# Patient Record
Sex: Female | Born: 1980 | Race: Black or African American | Hispanic: No | Marital: Single | Smoking: Current every day smoker
Health system: Southern US, Community
[De-identification: ages and names within clinical notes are randomized; demographics above are authoritative.]

## PROBLEM LIST (undated history)

## (undated) ENCOUNTER — Inpatient Hospital Stay (HOSPITAL_COMMUNITY): Payer: Self-pay

## (undated) DIAGNOSIS — E785 Hyperlipidemia, unspecified: Secondary | ICD-10-CM

## (undated) DIAGNOSIS — R7303 Prediabetes: Secondary | ICD-10-CM

## (undated) DIAGNOSIS — I10 Essential (primary) hypertension: Secondary | ICD-10-CM

## (undated) DIAGNOSIS — E119 Type 2 diabetes mellitus without complications: Secondary | ICD-10-CM

## (undated) DIAGNOSIS — L309 Dermatitis, unspecified: Secondary | ICD-10-CM

## (undated) DIAGNOSIS — F419 Anxiety disorder, unspecified: Secondary | ICD-10-CM

## (undated) DIAGNOSIS — R51 Headache: Secondary | ICD-10-CM

## (undated) DIAGNOSIS — F3160 Bipolar disorder, current episode mixed, unspecified: Secondary | ICD-10-CM

## (undated) DIAGNOSIS — N83201 Unspecified ovarian cyst, right side: Secondary | ICD-10-CM

## (undated) DIAGNOSIS — F209 Schizophrenia, unspecified: Secondary | ICD-10-CM

## (undated) DIAGNOSIS — O24419 Gestational diabetes mellitus in pregnancy, unspecified control: Secondary | ICD-10-CM

## (undated) DIAGNOSIS — N83202 Unspecified ovarian cyst, left side: Secondary | ICD-10-CM

## (undated) HISTORY — DX: Hyperlipidemia, unspecified: E78.5

## (undated) HISTORY — PX: NO PAST SURGERIES: SHX2092

## (undated) HISTORY — DX: Type 2 diabetes mellitus without complications: E11.9

## (undated) HISTORY — DX: Dermatitis, unspecified: L30.9

## (undated) HISTORY — DX: Essential (primary) hypertension: I10

## (undated) HISTORY — DX: Prediabetes: R73.03

---
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
## 1999-02-21 ENCOUNTER — Emergency Department (HOSPITAL_COMMUNITY): Admission: EM | Admit: 1999-02-21 | Discharge: 1999-02-21 | Payer: Self-pay | Admitting: Emergency Medicine

## 1999-10-24 ENCOUNTER — Emergency Department (HOSPITAL_COMMUNITY): Admission: EM | Admit: 1999-10-24 | Discharge: 1999-10-24 | Payer: Self-pay

## 1999-10-24 ENCOUNTER — Encounter: Payer: Self-pay | Admitting: Emergency Medicine

## 1999-11-05 ENCOUNTER — Ambulatory Visit (HOSPITAL_COMMUNITY): Admission: RE | Admit: 1999-11-05 | Discharge: 1999-11-05 | Payer: Self-pay

## 2000-03-10 ENCOUNTER — Emergency Department (HOSPITAL_COMMUNITY): Admission: EM | Admit: 2000-03-10 | Discharge: 2000-03-10 | Payer: Self-pay | Admitting: Emergency Medicine

## 2000-06-19 ENCOUNTER — Emergency Department (HOSPITAL_COMMUNITY): Admission: EM | Admit: 2000-06-19 | Discharge: 2000-06-19 | Payer: Self-pay | Admitting: Emergency Medicine

## 2000-06-19 ENCOUNTER — Encounter: Payer: Self-pay | Admitting: Emergency Medicine

## 2001-07-18 ENCOUNTER — Emergency Department (HOSPITAL_COMMUNITY): Admission: EM | Admit: 2001-07-18 | Discharge: 2001-07-18 | Payer: Self-pay | Admitting: Emergency Medicine

## 2001-09-29 ENCOUNTER — Emergency Department (HOSPITAL_COMMUNITY): Admission: EM | Admit: 2001-09-29 | Discharge: 2001-09-29 | Payer: Self-pay | Admitting: Emergency Medicine

## 2002-05-09 ENCOUNTER — Emergency Department (HOSPITAL_COMMUNITY): Admission: EM | Admit: 2002-05-09 | Discharge: 2002-05-10 | Payer: Self-pay | Admitting: Emergency Medicine

## 2002-05-10 ENCOUNTER — Encounter: Payer: Self-pay | Admitting: Emergency Medicine

## 2003-06-30 ENCOUNTER — Inpatient Hospital Stay (HOSPITAL_COMMUNITY): Admission: AD | Admit: 2003-06-30 | Discharge: 2003-06-30 | Payer: Self-pay | Admitting: *Deleted

## 2003-07-15 ENCOUNTER — Ambulatory Visit (HOSPITAL_COMMUNITY): Admission: RE | Admit: 2003-07-15 | Discharge: 2003-07-15 | Payer: Self-pay | Admitting: *Deleted

## 2003-08-22 ENCOUNTER — Inpatient Hospital Stay (HOSPITAL_COMMUNITY): Admission: AD | Admit: 2003-08-22 | Discharge: 2003-08-22 | Payer: Self-pay | Admitting: Obstetrics and Gynecology

## 2003-09-27 ENCOUNTER — Ambulatory Visit (HOSPITAL_COMMUNITY): Admission: RE | Admit: 2003-09-27 | Discharge: 2003-09-27 | Payer: Self-pay | Admitting: Obstetrics and Gynecology

## 2003-11-23 ENCOUNTER — Ambulatory Visit: Payer: Self-pay | Admitting: *Deleted

## 2003-12-07 ENCOUNTER — Ambulatory Visit: Payer: Self-pay | Admitting: Family Medicine

## 2003-12-21 ENCOUNTER — Ambulatory Visit (HOSPITAL_COMMUNITY): Admission: RE | Admit: 2003-12-21 | Discharge: 2003-12-21 | Payer: Self-pay | Admitting: Obstetrics and Gynecology

## 2003-12-21 ENCOUNTER — Ambulatory Visit: Payer: Self-pay | Admitting: *Deleted

## 2004-01-04 ENCOUNTER — Ambulatory Visit: Payer: Self-pay | Admitting: *Deleted

## 2004-01-11 ENCOUNTER — Ambulatory Visit: Payer: Self-pay | Admitting: Obstetrics & Gynecology

## 2004-01-25 ENCOUNTER — Ambulatory Visit: Payer: Self-pay | Admitting: *Deleted

## 2004-01-27 ENCOUNTER — Ambulatory Visit: Payer: Self-pay | Admitting: *Deleted

## 2004-02-01 ENCOUNTER — Inpatient Hospital Stay (HOSPITAL_COMMUNITY): Admission: AD | Admit: 2004-02-01 | Discharge: 2004-02-08 | Payer: Self-pay | Admitting: *Deleted

## 2004-02-01 ENCOUNTER — Ambulatory Visit: Payer: Self-pay | Admitting: Obstetrics & Gynecology

## 2004-02-01 ENCOUNTER — Ambulatory Visit: Payer: Self-pay | Admitting: Obstetrics and Gynecology

## 2004-12-30 ENCOUNTER — Emergency Department (HOSPITAL_COMMUNITY): Admission: EM | Admit: 2004-12-30 | Discharge: 2004-12-30 | Payer: Self-pay | Admitting: Emergency Medicine

## 2005-05-03 ENCOUNTER — Emergency Department (HOSPITAL_COMMUNITY): Admission: EM | Admit: 2005-05-03 | Discharge: 2005-05-03 | Payer: Self-pay | Admitting: Emergency Medicine

## 2005-08-21 ENCOUNTER — Emergency Department (HOSPITAL_COMMUNITY): Admission: EM | Admit: 2005-08-21 | Discharge: 2005-08-21 | Payer: Self-pay | Admitting: Emergency Medicine

## 2006-01-23 ENCOUNTER — Emergency Department (HOSPITAL_COMMUNITY): Admission: EM | Admit: 2006-01-23 | Discharge: 2006-01-23 | Payer: Self-pay | Admitting: Emergency Medicine

## 2006-02-12 ENCOUNTER — Emergency Department (HOSPITAL_COMMUNITY): Admission: EM | Admit: 2006-02-12 | Discharge: 2006-02-12 | Payer: Self-pay | Admitting: Emergency Medicine

## 2006-11-12 ENCOUNTER — Ambulatory Visit: Payer: Self-pay | Admitting: Obstetrics & Gynecology

## 2006-11-12 ENCOUNTER — Encounter (INDEPENDENT_AMBULATORY_CARE_PROVIDER_SITE_OTHER): Payer: Self-pay | Admitting: *Deleted

## 2007-10-06 ENCOUNTER — Emergency Department (HOSPITAL_COMMUNITY): Admission: EM | Admit: 2007-10-06 | Discharge: 2007-10-06 | Payer: Self-pay | Admitting: Emergency Medicine

## 2008-04-16 ENCOUNTER — Emergency Department (HOSPITAL_COMMUNITY): Admission: EM | Admit: 2008-04-16 | Discharge: 2008-04-16 | Payer: Self-pay | Admitting: Family Medicine

## 2008-05-06 ENCOUNTER — Emergency Department (HOSPITAL_COMMUNITY): Admission: EM | Admit: 2008-05-06 | Discharge: 2008-05-06 | Payer: Self-pay | Admitting: Emergency Medicine

## 2009-09-20 ENCOUNTER — Emergency Department (HOSPITAL_COMMUNITY): Admission: EM | Admit: 2009-09-20 | Discharge: 2009-09-20 | Payer: Self-pay | Admitting: Emergency Medicine

## 2009-12-14 ENCOUNTER — Emergency Department (HOSPITAL_COMMUNITY): Admission: EM | Admit: 2009-12-14 | Discharge: 2009-12-14 | Payer: Self-pay | Admitting: Emergency Medicine

## 2010-04-03 LAB — URINALYSIS, ROUTINE W REFLEX MICROSCOPIC
Bilirubin Urine: NEGATIVE
Glucose, UA: NEGATIVE mg/dL
Urobilinogen, UA: 1 mg/dL (ref 0.0–1.0)
pH: 6.5 (ref 5.0–8.0)

## 2010-04-03 LAB — URINE CULTURE: Culture  Setup Time: 201111241804

## 2010-04-03 LAB — URINE MICROSCOPIC-ADD ON

## 2010-04-03 LAB — POCT I-STAT, CHEM 8
Calcium, Ion: 1.1 mmol/L — ABNORMAL LOW (ref 1.12–1.32)
Glucose, Bld: 92 mg/dL (ref 70–99)
HCT: 39 % (ref 36.0–46.0)
Hemoglobin: 13.3 g/dL (ref 12.0–15.0)
TCO2: 23 mmol/L (ref 0–100)

## 2010-04-03 LAB — CBC
MCV: 91.3 fL (ref 78.0–100.0)
Platelets: 276 10*3/uL (ref 150–400)

## 2010-04-03 LAB — WET PREP, GENITAL
Clue Cells Wet Prep HPF POC: NONE SEEN
Yeast Wet Prep HPF POC: NONE SEEN

## 2010-04-03 LAB — GC/CHLAMYDIA PROBE AMP, GENITAL
Chlamydia, DNA Probe: NEGATIVE
GC Probe Amp, Genital: NEGATIVE

## 2010-04-03 LAB — DIFFERENTIAL
Lymphs Abs: 3.7 10*3/uL (ref 0.7–4.0)
Neutrophils Relative %: 47 % (ref 43–77)

## 2010-04-06 LAB — COMPREHENSIVE METABOLIC PANEL
ALT: 16 U/L (ref 0–35)
Chloride: 110 mEq/L (ref 96–112)
Creatinine, Ser: 0.92 mg/dL (ref 0.4–1.2)
Glucose, Bld: 91 mg/dL (ref 70–99)
Total Protein: 6.7 g/dL (ref 6.0–8.3)

## 2010-04-06 LAB — CBC
HCT: 35.7 % — ABNORMAL LOW (ref 36.0–46.0)
MCH: 30.8 pg (ref 26.0–34.0)
RBC: 3.89 MIL/uL (ref 3.87–5.11)
RDW: 13.7 % (ref 11.5–15.5)

## 2010-04-06 LAB — URINE MICROSCOPIC-ADD ON

## 2010-04-06 LAB — DIFFERENTIAL
Basophils Relative: 1 % (ref 0–1)
Eosinophils Absolute: 0.1 10*3/uL (ref 0.0–0.7)
Eosinophils Relative: 1 % (ref 0–5)
Monocytes Absolute: 1.1 10*3/uL — ABNORMAL HIGH (ref 0.1–1.0)
Monocytes Relative: 14 % — ABNORMAL HIGH (ref 3–12)
Neutrophils Relative %: 51 % (ref 43–77)

## 2010-04-06 LAB — URINE CULTURE

## 2010-04-06 LAB — URINALYSIS, ROUTINE W REFLEX MICROSCOPIC
Nitrite: POSITIVE — AB
Protein, ur: NEGATIVE mg/dL

## 2010-04-06 LAB — PREGNANCY, URINE: Preg Test, Ur: NEGATIVE

## 2010-04-16 ENCOUNTER — Emergency Department (HOSPITAL_COMMUNITY)
Admission: EM | Admit: 2010-04-16 | Discharge: 2010-04-16 | Discharge status: Against Medical Advice | Payer: Self-pay | Attending: Emergency Medicine | Admitting: Emergency Medicine

## 2010-04-16 DIAGNOSIS — M549 Dorsalgia, unspecified: Secondary | ICD-10-CM | POA: Insufficient documentation

## 2010-04-16 DIAGNOSIS — R05 Cough: Secondary | ICD-10-CM | POA: Insufficient documentation

## 2010-04-16 DIAGNOSIS — R059 Cough, unspecified: Secondary | ICD-10-CM | POA: Insufficient documentation

## 2010-04-16 DIAGNOSIS — R0602 Shortness of breath: Secondary | ICD-10-CM | POA: Insufficient documentation

## 2010-06-08 NOTE — Discharge Summary (Signed)
Rebecca Spence, Rebecca Spence                 ACCOUNT NO.:  [id number]   MEDICAL RECORD NO.:  [id number]          PATIENT TYPE:  WOC   LOCATION:  WOC                          FACILITY:  [facility]   PHYSICIAN:  Adrian Blackwater, MDDATE OF BIRTH:  12-12-1980   DATE OF ADMISSION:  02/01/2004  DATE OF DISCHARGE:  02/08/2004                                 DISCHARGE SUMMARY   ADMISSION DIAGNOSES:  1.  Preeclampsia.  2.  Induction of labor.   DISCHARGE DIAGNOSIS:  Postpartum day #3 pregnancy induced hypertension.   BRIEF HISTORY:  This is a 30 year old G1 P1-0-0-1 that presented at 36-1/7  weeks with diagnosis of preeclampsia, severe by labs, vital signs and  symptoms.  She was admitted for induction of labor on February 01, 2004.  Cervidil was placed, alternating with slow progress into labor during the  day on February 02, 2004 and February 03, 2004.  The patient was stable and  the fetal monitor was reassuring during those days and the patient did not  have any contractions but uterine irritability.  Blood pressure during those  days was controlled.  The patient was receiving magnesium therapy up to 80  mg per hour.  On February 03, 2004 at 4 p.m. the patient was placed on  Pitocin low dose per protocol to augment labor and penicillin was given for  GBS prophylaxis.  Epidural anesthesia was placed on February 04, 2004.  The  patient delivered on February 06, 2004 at 5 p.m. by spontaneous vaginal  delivery a viable female with Apgars of 8 at one minute and 9 at five minutes  under epidural anesthesia.  The infant was handed to mom.  No placental  abnormalities spontaneous delivery.  There was no laceration and 100 cubic  centimeters estimated blood loss.  The patient and the infant were stable.  The patient was transferred to AICU to continue magnesium therapy for the  next 24 hours, then stable with PIH labs which showed creatinine normalizing  from 0.9 to 0.8 and the patient asymptomatic.  After 24  hours of magnesium  therapy, the patient remained asymptomatic with postpartum recovery with  blood pressure 130/80 to 150/90.  The patient was able to be discharged to  home with a diagnosis of preeclampsia not requiring any medications.  At  discharge, the patient will use only ibuprofen for pain control which is  none at this moment, Ortho-Novum 1/35 for birth control and will have a  follow-up appointment at [facility] in six weeks after delivery.  The  infant will now be going home with mom after circumcision procedure and will  be bottle fed.   LABORATORY DATA AT DISCHARGE:  CBC:  White blood cells 19.7, hemoglobin  11.4, hematocrit 36.1, platelets 140,000.  CMP:  Sodium 136, potassium 3.9,  chloride 103, CO2 23, BUN 3, creatinine 0.9, glucose 75, AST 28, ALT 13,  uric acid 6.8, LDH 291, magnesium 6.4.   FOLLOWUP:  The patient will have a follow-up appointment in six weeks, will  be very important to check on creatinine and uric acid.   DISCHARGE  MEDICATIONS:  Ibuprofen 400 mg q.6h. for pain p.r.n., Ortho-Novum  1/35 for birth control pills.      IM/MEDQ  D:  02/08/2004  T:  02/08/2004  Job:  [id number]   cc:   Javier Glazier. Okey Dupre, M.D.  Fax: [phone]

## 2010-06-28 ENCOUNTER — Emergency Department (HOSPITAL_COMMUNITY)
Admission: EM | Admit: 2010-06-28 | Discharge: 2010-06-29 | Disposition: A | Discharge status: Home / Self Care | Payer: Self-pay | Attending: Emergency Medicine | Admitting: Emergency Medicine

## 2010-06-28 DIAGNOSIS — L500 Allergic urticaria: Secondary | ICD-10-CM | POA: Insufficient documentation

## 2010-06-28 DIAGNOSIS — Z91018 Allergy to other foods: Secondary | ICD-10-CM | POA: Insufficient documentation

## 2010-06-28 DIAGNOSIS — R229 Localized swelling, mass and lump, unspecified: Secondary | ICD-10-CM | POA: Insufficient documentation

## 2010-06-28 DIAGNOSIS — T781XXA Other adverse food reactions, not elsewhere classified, initial encounter: Secondary | ICD-10-CM | POA: Insufficient documentation

## 2010-07-30 ENCOUNTER — Emergency Department (HOSPITAL_COMMUNITY)
Admission: EM | Admit: 2010-07-30 | Discharge: 2010-07-30 | Disposition: A | Discharge status: Home / Self Care | Payer: Self-pay | Attending: Emergency Medicine | Admitting: Emergency Medicine

## 2010-07-30 DIAGNOSIS — I10 Essential (primary) hypertension: Secondary | ICD-10-CM | POA: Insufficient documentation

## 2010-07-30 DIAGNOSIS — T7840XA Allergy, unspecified, initial encounter: Secondary | ICD-10-CM | POA: Insufficient documentation

## 2010-07-30 DIAGNOSIS — R220 Localized swelling, mass and lump, head: Secondary | ICD-10-CM | POA: Insufficient documentation

## 2010-07-30 DIAGNOSIS — R221 Localized swelling, mass and lump, neck: Secondary | ICD-10-CM | POA: Insufficient documentation

## 2010-07-30 DIAGNOSIS — F17200 Nicotine dependence, unspecified, uncomplicated: Secondary | ICD-10-CM | POA: Insufficient documentation

## 2010-07-30 DIAGNOSIS — L299 Pruritus, unspecified: Secondary | ICD-10-CM | POA: Insufficient documentation

## 2010-07-30 DIAGNOSIS — R21 Rash and other nonspecific skin eruption: Secondary | ICD-10-CM | POA: Insufficient documentation

## 2010-07-30 DIAGNOSIS — Z79899 Other long term (current) drug therapy: Secondary | ICD-10-CM | POA: Insufficient documentation

## 2010-10-11 LAB — US OB DETAIL + 14 WK

## 2010-10-22 LAB — URINALYSIS, ROUTINE W REFLEX MICROSCOPIC
Bilirubin Urine: NEGATIVE
Nitrite: NEGATIVE
Protein, ur: NEGATIVE
pH: 6

## 2010-10-22 LAB — CBC
HCT: 37.1
Hemoglobin: 12.4
MCHC: 33.5
MCV: 94.7
RBC: 3.92
WBC: 10.1

## 2010-10-22 LAB — COMPREHENSIVE METABOLIC PANEL
AST: 21
BUN: 10
CO2: 25
Calcium: 9.4
Chloride: 104
Creatinine, Ser: 0.78
GFR calc non Af Amer: >60
Glucose, Bld: 80
Total Bilirubin: 0.8

## 2010-10-22 LAB — LIPASE, BLOOD: Lipase: 21

## 2010-10-22 LAB — DIFFERENTIAL
Basophils Absolute: 0.1
Eosinophils Relative: 2
Lymphocytes Relative: 36
Neutro Abs: 5.3
Neutrophils Relative %: 52

## 2010-10-22 LAB — WET PREP, GENITAL: Yeast Wet Prep HPF POC: NONE SEEN

## 2010-11-15 ENCOUNTER — Encounter: Payer: Self-pay | Admitting: Obstetrics and Gynecology

## 2010-12-25 ENCOUNTER — Emergency Department (HOSPITAL_COMMUNITY)
Admission: EM | Admit: 2010-12-25 | Discharge: 2010-12-25 | Disposition: A | Discharge status: Home / Self Care | Payer: Medicaid Other | Attending: Emergency Medicine | Admitting: Emergency Medicine

## 2010-12-25 DIAGNOSIS — F17200 Nicotine dependence, unspecified, uncomplicated: Secondary | ICD-10-CM | POA: Insufficient documentation

## 2010-12-25 DIAGNOSIS — R5383 Other fatigue: Secondary | ICD-10-CM | POA: Insufficient documentation

## 2010-12-25 DIAGNOSIS — J3489 Other specified disorders of nose and nasal sinuses: Secondary | ICD-10-CM | POA: Insufficient documentation

## 2010-12-25 DIAGNOSIS — R000 Tachycardia, unspecified: Secondary | ICD-10-CM | POA: Insufficient documentation

## 2010-12-25 DIAGNOSIS — R51 Headache: Secondary | ICD-10-CM | POA: Insufficient documentation

## 2010-12-25 DIAGNOSIS — R05 Cough: Secondary | ICD-10-CM | POA: Insufficient documentation

## 2010-12-25 DIAGNOSIS — IMO0001 Reserved for inherently not codable concepts without codable children: Secondary | ICD-10-CM | POA: Insufficient documentation

## 2010-12-25 DIAGNOSIS — R5381 Other malaise: Secondary | ICD-10-CM | POA: Insufficient documentation

## 2010-12-25 DIAGNOSIS — R059 Cough, unspecified: Secondary | ICD-10-CM | POA: Insufficient documentation

## 2010-12-25 DIAGNOSIS — R070 Pain in throat: Secondary | ICD-10-CM | POA: Insufficient documentation

## 2010-12-25 DIAGNOSIS — J111 Influenza due to unidentified influenza virus with other respiratory manifestations: Secondary | ICD-10-CM

## 2010-12-25 MED ORDER — OSELTAMIVIR PHOSPHATE 75 MG PO CAPS: 75.0000 mg | ORAL_CAPSULE | Freq: Two times a day (BID) | ORAL | Status: AC

## 2010-12-25 MED ORDER — ACETAMINOPHEN 325 MG PO TABS
650.0000 mg | ORAL_TABLET | Freq: Once | ORAL | Status: AC
  Administered 2010-12-25: 650 mg via ORAL
  Filled 2010-12-25: qty 2

## 2010-12-25 NOTE — ED Notes (Signed)
Notified NP of measured bP

## 2010-12-25 NOTE — ED Notes (Signed)
Pt to ED for eval of body aches/sorethroat; pt reports that she has been unable to eat since Sunday d/t a sore throat; pt having severe headache, body aches, pt reports fevers at home but did not check;

## 2010-12-25 NOTE — ED Provider Notes (Signed)
Evaluation and management procedures were performed by the PA/NP under my supervision/collaboration.   Dione Booze, MD 12/25/10 [phone]

## 2010-12-25 NOTE — ED Provider Notes (Signed)
History     CSN: [id number] Arrival date & time: 12/25/2010  5:34 PM   First MD Initiated Contact with Patient 12/25/10 2031      Chief Complaint  Patient presents with  . Headache  . Sore Throat  . Generalized Body Aches    (Consider location/radiation/quality/duration/timing/severity/associated sxs/prior treatment) HPI Comments: [name] reports acute onset of flulike symptoms including headache, sore throat, cough, myalgias, and fever to 103 since Sunday.  She has been taking over-the-counter TheraFlu and Advil with some resolution of her fevers.  Reports little intake due to sore throat pain, not inability to swallow.  Reports fatigue with any exertion.  Did not get a flu shot this season.  Patient is a 30 y.o. female presenting with headaches and pharyngitis. The history is provided by the patient.  Headache  This is a new problem. The current episode started 2 days ago. The problem occurs constantly. The problem has not changed since onset.The headache is associated with nothing. The pain is at a severity of 5/10. The pain is moderate. Associated symptoms include a fever. Pertinent negatives include no shortness of breath, no nausea and no vomiting. She has tried NSAIDs for the symptoms.  Sore Throat Associated symptoms include congestion, coughing, fatigue, a fever, headaches, myalgias and a sore throat. Pertinent negatives include no chest pain, nausea, vomiting or weakness.    No past medical history on file.  No past surgical history on file.  No family history on file.  History  Substance Use Topics  . Smoking status: Current Everyday Smoker  . Smokeless tobacco: Not on file  . Alcohol Use: Yes     socially    OB History    Grav Para Term Preterm Abortions TAB SAB Ect Mult Living                  Review of Systems  Constitutional: Positive for fever and fatigue.  HENT: Positive for congestion, sore throat and sinus pressure.   Eyes: Negative.     Respiratory: Positive for cough. Negative for shortness of breath and wheezing.   Cardiovascular: Negative for chest pain.  Gastrointestinal: Negative for nausea and vomiting.  Genitourinary: Negative for dysuria and frequency.  Musculoskeletal: Positive for myalgias.  Skin: Negative.   Neurological: Positive for headaches. Negative for dizziness and weakness.  Hematological: Negative.   Psychiatric/Behavioral: Negative.     Allergies  Review of patient's allergies indicates no known allergies.  Home Medications   Current Outpatient Rx  Name Route Sig Dispense Refill  . IBUPROFEN PO Oral Take 1 tablet by mouth every 6 (six) hours as needed. For pain.       BP 148/107  Pulse 103  Temp(Src) 99.1 F (37.3 C) (Oral)  Resp 20  Ht 5\' 7"  (1.702 m)  Wt 200 lb (90.719 kg)  BMI 31.32 kg/m2  SpO2 100%  LMP 12/07/2010  Physical Exam  Constitutional: She is oriented to person, place, and time. She appears well-developed and well-nourished.  HENT:  Head: Normocephalic.  Eyes: EOM are normal.  Neck: Neck supple. No tracheal deviation present.  Cardiovascular: Tachycardia present.   Pulmonary/Chest: Effort normal. No respiratory distress. She has no wheezes.  Abdominal: Soft.  Musculoskeletal: Normal range of motion.  Neurological: She is oriented to person, place, and time.  Skin: Skin is warm and dry.  Psychiatric: She has a normal mood and affect.    ED Course  Procedures (including critical care time)  Labs Reviewed - No data to  display No results found.   No diagnosis found.    MDM  This is most likely flu.  We'll treat patient with Tamiflu instructed to take alternating doses of Tylenol, ibuprofen, increase fluids, rest, we'll provide work excuse.        Arman Filter, NP 12/25/10 2101

## 2010-12-25 NOTE — ED Notes (Signed)
Pt complains of real bad head, throat and body pain.

## 2011-01-22 NOTE — L&D Delivery Note (Signed)
Delivery Note At 1:28 PM a viable female was delivered via  (Presentation: ;  ).  APGAR: , ; weight .   Placenta status: , .  Cord:  with the following complications: .  Cord pH: not done  Anesthesia: Epidural  Episiotomy:  Lacerations:  Suture Repair: 2.0 Est. Blood Loss (mL):   Mom to postpartum.  Baby to nursery-stable.  Jordyne Poehlman A 10/21/2011, 1:39 PM

## 2011-03-10 ENCOUNTER — Encounter (HOSPITAL_COMMUNITY): Payer: Self-pay

## 2011-03-10 ENCOUNTER — Inpatient Hospital Stay (HOSPITAL_COMMUNITY)
Admission: AD | Admit: 2011-03-10 | Discharge: 2011-03-10 | Disposition: A | Discharge status: Home / Self Care | Payer: Medicaid Other | Source: Ambulatory Visit | Attending: Obstetrics & Gynecology | Admitting: Obstetrics & Gynecology

## 2011-03-10 ENCOUNTER — Inpatient Hospital Stay (HOSPITAL_COMMUNITY): Payer: Medicaid Other

## 2011-03-10 DIAGNOSIS — Z3490 Encounter for supervision of normal pregnancy, unspecified, unspecified trimester: Secondary | ICD-10-CM

## 2011-03-10 DIAGNOSIS — O99891 Other specified diseases and conditions complicating pregnancy: Secondary | ICD-10-CM | POA: Insufficient documentation

## 2011-03-10 DIAGNOSIS — Z1389 Encounter for screening for other disorder: Secondary | ICD-10-CM

## 2011-03-10 DIAGNOSIS — R109 Unspecified abdominal pain: Secondary | ICD-10-CM | POA: Insufficient documentation

## 2011-03-10 LAB — POCT PREGNANCY, URINE: Preg Test, Ur: POSITIVE — AB

## 2011-03-10 LAB — URINALYSIS, ROUTINE W REFLEX MICROSCOPIC
Glucose, UA: NEGATIVE mg/dL
Hgb urine dipstick: NEGATIVE
Ketones, ur: NEGATIVE mg/dL
pH: 7 (ref 5.0–8.0)

## 2011-03-10 LAB — URINE MICROSCOPIC-ADD ON

## 2011-03-10 LAB — HCG, QUANTITATIVE, PREGNANCY: hCG, Beta Chain, Quant, S: 8764 m[IU]/mL — ABNORMAL HIGH (ref <5)

## 2011-03-10 LAB — WET PREP, GENITAL: Yeast Wet Prep HPF POC: NONE SEEN

## 2011-03-10 NOTE — Progress Notes (Signed)
Onset of left lower abdominal pain since Friday night, nausea, positive home pregnancy test, LMP 01/29/11.

## 2011-03-10 NOTE — ED Provider Notes (Signed)
Chief Complaint:  Abdominal Cramping   FAIGA STONES is  31 y.o. G2P1.  Patient's last menstrual period was 01/29/2011.Marland Kitchen  Her pregnancy status is positive. [redacted]w[redacted]d by LMP She presents complaining of Abdominal Cramping  Reports left lower abdominal pain since Friday night, nausea, positive home pregnancy test, LMP 01/29/11.    Obstetrical/Gynecological History: OB History    Grav Para Term Preterm Abortions TAB SAB Ect Mult Living   2 1        1       Past Medical History: No past medical history on file.  Past Surgical History: No past surgical history on file.  Family History: No family history on file.  Social History: History  Substance Use Topics  . Smoking status: Current Everyday Smoker  . Smokeless tobacco: Not on file  . Alcohol Use: Yes     socially    Allergies: No Known Allergies  Prescriptions prior to admission  Medication Sig Dispense Refill  . ibuprofen (ADVIL,MOTRIN) 200 MG tablet Take 400 mg by mouth daily as needed. For pain      . Multiple Vitamin (MULITIVITAMIN WITH MINERALS) TABS Take 1 tablet by mouth daily.      Marland Kitchen OVER THE COUNTER MEDICATION Take 1 tablet by mouth daily. Pt states that she took an over thr counter diet pill, only once because she didn't like the way it made her feel        Review of Systems - Negative except what has been reviewed in the HPI  Physical Exam   Temperature 97.7 F (36.5 C), temperature source Oral, height 5\' 7"  (1.702 m), weight 234 lb 9.6 oz (106.414 kg), last menstrual period 01/29/2011.  General: General appearance - alert, well appearing, and in no distress, oriented to person, place, and time and overweight Mental status - alert, oriented to person, place, and time, normal mood, behavior, speech, dress, motor activity, and thought processes, affect appropriate to mood Abdomen - soft, nontender, nondistended, no masses or organomegaly obese Focused Gynecological Exam: VULVA: normal appearing vulva with no masses,  tenderness or lesions, VAGINA: normal appearing vagina with normal color and discharge, no lesions, CERVIX: normal appearing cervix without discharge or lesions, UTERUS: enlarged to 4-5 week's size, ADNEXA: Right adnexal enlargement, non tender  Labs: Recent Results (from the past 24 hour(s))  URINALYSIS, ROUTINE W REFLEX MICROSCOPIC   Collection Time   03/10/11 10:15 AM      Component Value Range   Color, Urine YELLOW  YELLOW    APPearance HAZY (*) CLEAR    Specific Gravity, Urine 1.020  1.005 - 1.030    pH 7.0  5.0 - 8.0    Glucose, UA NEGATIVE  NEGATIVE (mg/dL)   Hgb urine dipstick NEGATIVE  NEGATIVE    Bilirubin Urine NEGATIVE  NEGATIVE    Ketones, ur NEGATIVE  NEGATIVE (mg/dL)   Protein, ur NEGATIVE  NEGATIVE (mg/dL)   Urobilinogen, UA 0.2  0.0 - 1.0 (mg/dL)   Nitrite NEGATIVE  NEGATIVE    Leukocytes, UA MODERATE (*) NEGATIVE   URINE MICROSCOPIC-ADD ON   Collection Time   03/10/11 10:15 AM      Component Value Range   Squamous Epithelial / LPF MANY (*) RARE    WBC, UA 21-50  <3 (WBC/hpf)   RBC / HPF 0-2  <3 (RBC/hpf)   Bacteria, UA MANY (*) RARE   POCT PREGNANCY, URINE   Collection Time   03/10/11 10:25 AM      Component Value Range  Preg Test, Ur POSITIVE (*) NEGATIVE   ABO/RH   Collection Time   03/10/11 10:43 AM      Component Value Range   ABO/RH(D) A POS    HCG, QUANTITATIVE, PREGNANCY   Collection Time   03/10/11 10:43 AM      Component Value Range   hCG, Beta Chain, Quant, S 8764 (*) <5 (mIU/mL)  WET PREP, GENITAL   Collection Time   03/10/11 11:09 AM      Component Value Range   Yeast Wet Prep HPF POC NONE SEEN  NONE SEEN    Trich, Wet Prep NONE SEEN  NONE SEEN    Clue Cells Wet Prep HPF POC NONE SEEN  NONE SEEN    WBC, Wet Prep HPF POC MODERATE (*) NONE SEEN    Imaging Studies:  No results found.   Assessment: IUP [redacted]w[redacted]d   Plan: Discharge home  Early preg precautions reviewd MCD info, referral list, and verification letter given  Tamika Shropshire  E. 03/10/2011,12:15 PM

## 2011-03-10 NOTE — Discharge Instructions (Signed)
    ________________________________________ ° ° ° ° °To schedule your Maternity Eligibility Appointment, please call [phone].  When you arrive for your appointment you must bring the following items or information listed below.  Your appointment will be rescheduled if you do not have these items or are 15 minutes late. °If currently receiving Medicaid, you MUST bring: °1. Medicaid Card °2. Social Security Card °3. Picture ID °4. Proof of Pregnancy °5. Verification of current address if the address on Medicaid card is incorrect "postmarked mail" °If not receiving Medicaid, you MUST bring: °1. Social Security Card °2. Picture ID °3. Birth Certificate (if available) Passport or *Green Card °4. Proof of Pregnancy °5. Verification of current address "postmarked mail" for each income presented. °6. Verification of insurance coverage, if any °7. Check stubs from each employer for the previous month (if unable to present check stub  °for each week, we will accept check stub for the first and last week ill the same month.) If you can't locate check stubs, you must bring a letter from the employer(s) and it must have the following information on letterhead, typed, in English: °o name of company °o company telephone number °o how long been with the company, if less than one month °o how much person earns per hour °o how many hours per week work °o the gross pay the person earned for the previous month °If you are 32 years old or less, you do not have to bring proof of income unless you work or live with the father of the baby and at that time we will need proof of income from you and/or the father of the baby. °Green Card recipients are eligible for Medicaid for Pregnant Women (MPW) ° °Prenatal Care Providers °[facility]    [facility] ° Phone- [phone]     Phone: [phone] °         °[facility]                      [facility] ° @[place]     Phone: [phone] ° Phone: [phone] °        [facility] °[facility]     Phone: [phone] ° Phone: [phone]   °        [facility] °[facility] [place]                hone: [phone] ° Phone: [phone] °        [facility] °Dr. Bernard Marshall      Phone: [phone] ° Phone: [phone] °        [facility] °[facility]                Phone: [phone] ° Women’s Health  ° Phone:[phone]    [facility] ([place]) °         Phone: [phone] °[facility] °  Phone: [phone] °

## 2011-03-23 ENCOUNTER — Encounter (HOSPITAL_COMMUNITY): Payer: Self-pay | Admitting: Obstetrics and Gynecology

## 2011-03-23 ENCOUNTER — Inpatient Hospital Stay (HOSPITAL_COMMUNITY)
Admission: AD | Admit: 2011-03-23 | Discharge: 2011-03-23 | Disposition: A | Discharge status: Home / Self Care | Payer: Medicaid Other | Source: Ambulatory Visit | Attending: Obstetrics & Gynecology | Admitting: Obstetrics & Gynecology

## 2011-03-23 DIAGNOSIS — O26859 Spotting complicating pregnancy, unspecified trimester: Secondary | ICD-10-CM

## 2011-03-23 DIAGNOSIS — O209 Hemorrhage in early pregnancy, unspecified: Secondary | ICD-10-CM | POA: Insufficient documentation

## 2011-03-23 NOTE — ED Provider Notes (Signed)
History     No chief complaint on file.  HPI 31 y.o. G2P0101 at [redacted]w[redacted]d with one episode of vaginal bleeding with wiping following intercourse this morning. No pain. IUP confirmed at MAU visit on 2/17. Has appt with Dr. Gaynell Face for prenatal care on Monday.     No past medical history on file.  History reviewed. No pertinent past surgical history.  History reviewed. No pertinent family history.  History  Substance Use Topics  . Smoking status: Former Smoker    Types: Cigarettes  . Smokeless tobacco: Not on file  . Alcohol Use: No     socially-not with pregnancy    Allergies:  Allergies  Allergen Reactions  . Orange Anaphylaxis and Hives    "My throat starts closing up, my tongue, lips started swelling & burning    Prescriptions prior to admission  Medication Sig Dispense Refill  . Multiple Vitamin (MULITIVITAMIN WITH MINERALS) TABS Take 1 tablet by mouth daily.        Review of Systems  Constitutional: Negative.   Respiratory: Negative.   Cardiovascular: Negative.   Gastrointestinal: Negative for nausea, vomiting, abdominal pain, diarrhea and constipation.  Genitourinary: Negative for dysuria, urgency, frequency, hematuria and flank pain.       Positive for vaginal bleeding  Musculoskeletal: Negative.   Neurological: Negative.   Psychiatric/Behavioral: Negative.    Physical Exam   Blood pressure 131/75, pulse 97, temperature 98.8 F (37.1 C), temperature source Oral, resp. rate 20, height 5\' 7"  (1.702 m), weight 239 lb (108.41 kg), last menstrual period 01/29/2011.  Physical Exam  Nursing note and vitals reviewed. Constitutional: She is oriented to person, place, and time. She appears well-developed and well-nourished. No distress.  HENT:  Head: Normocephalic and atraumatic.  Cardiovascular: Normal rate and regular rhythm.   Respiratory: Effort normal. No respiratory distress.  GI: Soft. She exhibits no distension and no mass. There is no tenderness. There is  no rebound and no guarding.  Genitourinary: There is no rash or lesion on the right labia. There is no rash or lesion on the left labia. No erythema, tenderness or bleeding around the vagina. No vaginal discharge found.       Cervix closed, no evidence of bleeding   Neurological: She is alert and oriented to person, place, and time.  Skin: Skin is warm and dry.  Psychiatric: She has a normal mood and affect.    MAU Course  Procedures  Bedside u/s for viability only - + FHR, 150s  Assessment and Plan  30 y.o. G2P0101 at [redacted]w[redacted]d Postcoital bleeding - no active bleeding Precautions rev'd F/U for prenatal care as scheduled  Lanisha Stepanian 03/23/2011, 5:12 AM

## 2011-03-25 ENCOUNTER — Other Ambulatory Visit: Payer: Self-pay

## 2011-03-25 LAB — OB RESULTS CONSOLE ABO/RH: RH Type: POSITIVE

## 2011-03-25 LAB — OB RESULTS CONSOLE RUBELLA ANTIBODY, IGM: Rubella: IMMUNE

## 2011-03-25 LAB — OB RESULTS CONSOLE HIV ANTIBODY (ROUTINE TESTING): HIV: NONREACTIVE

## 2011-03-25 LAB — OB RESULTS CONSOLE RPR: RPR: NONREACTIVE

## 2011-03-25 LAB — OB RESULTS CONSOLE HEPATITIS B SURFACE ANTIGEN: Hepatitis B Surface Ag: NEGATIVE

## 2011-05-18 ENCOUNTER — Inpatient Hospital Stay (HOSPITAL_COMMUNITY)
Admission: AD | Admit: 2011-05-18 | Discharge: 2011-05-18 | Disposition: A | Discharge status: Home / Self Care | Payer: Medicaid Other | Source: Ambulatory Visit | Attending: Obstetrics | Admitting: Obstetrics

## 2011-05-18 ENCOUNTER — Encounter (HOSPITAL_COMMUNITY): Payer: Self-pay | Admitting: *Deleted

## 2011-05-18 DIAGNOSIS — R109 Unspecified abdominal pain: Secondary | ICD-10-CM | POA: Insufficient documentation

## 2011-05-18 DIAGNOSIS — O99891 Other specified diseases and conditions complicating pregnancy: Secondary | ICD-10-CM | POA: Insufficient documentation

## 2011-05-18 DIAGNOSIS — G43009 Migraine without aura, not intractable, without status migrainosus: Secondary | ICD-10-CM

## 2011-05-18 DIAGNOSIS — Z3480 Encounter for supervision of other normal pregnancy, unspecified trimester: Secondary | ICD-10-CM

## 2011-05-18 HISTORY — DX: Unspecified ovarian cyst, right side: N83.201

## 2011-05-18 HISTORY — DX: Unspecified ovarian cyst, right side: N83.202

## 2011-05-18 LAB — URINALYSIS, ROUTINE W REFLEX MICROSCOPIC
Leukocytes, UA: NEGATIVE
Nitrite: NEGATIVE
Specific Gravity, Urine: >1.03 — ABNORMAL HIGH (ref 1.005–1.030)
pH: 6 (ref 5.0–8.0)

## 2011-05-18 LAB — URINE MICROSCOPIC-ADD ON

## 2011-05-18 MED ORDER — DIPHENHYDRAMINE HCL 50 MG/ML IJ SOLN
25.0000 mg | Freq: Once | INTRAMUSCULAR | Status: AC
  Administered 2011-05-18: 25 mg via INTRAVENOUS
  Filled 2011-05-18: qty 1

## 2011-05-18 MED ORDER — BUTALBITAL-APAP-CAFFEINE 50-325-40 MG PO TABS: 1.0000 | ORAL_TABLET | Freq: Four times a day (QID) | ORAL | Status: AC | PRN

## 2011-05-18 MED ORDER — ONDANSETRON HCL 4 MG PO TABS: 4.0000 mg | ORAL_TABLET | Freq: Three times a day (TID) | ORAL | Status: AC | PRN

## 2011-05-18 MED ORDER — SODIUM CHLORIDE 0.9 % IV SOLN
25.0000 mg | Freq: Once | INTRAVENOUS | Status: AC
  Administered 2011-05-18: 25 mg via INTRAVENOUS
  Filled 2011-05-18: qty 1

## 2011-05-18 MED ORDER — ONDANSETRON 8 MG PO TBDP: 8.0000 mg | ORAL_TABLET | Freq: Once | ORAL | Status: DC

## 2011-05-18 MED ORDER — DEXAMETHASONE SODIUM PHOSPHATE 10 MG/ML IJ SOLN
10.0000 mg | Freq: Once | INTRAMUSCULAR | Status: AC
  Administered 2011-05-18: 10 mg via INTRAVENOUS
  Filled 2011-05-18: qty 1

## 2011-05-18 NOTE — Discharge Instructions (Signed)
Do overlap Tylenol with Fioricet due to risk of overdose of Tylenol.

## 2011-05-18 NOTE — MAU Note (Signed)
Has had h/a since Tues. Tylenol helped at first. Thurs night Tylenol did not help h/a. Today has not helped and threw up tonight when tried to take Tylenol. Had migraines before pregnancy and would have vomiting and light sensitivity. Current h/a are the same. Abdominal cramping from vomiting.

## 2011-05-18 NOTE — MAU Provider Note (Signed)
Rebecca Spence y.o.G2P0101 @[redacted]w[redacted]d  by LMP Chief Complaint  Patient presents with  . Headache  . Abdominal Cramping  . Emesis During Pregnancy     First Provider Initiated Contact with Patient 05/18/11 0218      SUBJECTIVE  HPI: Pt presents to MAU reporting HA x 4 days C/W previous migraine HA's. She states she has sensitivity to light, N/V and denies weakness, difficulty speaking, sinus pressure, fever, chills, neck stiffness. HA's initially responded to Tylenol, but have not over the past 24 hours. She has also developed worsening N/V and has a pain in her low abd when she vomits. She denies VB.  Past Medical History  Diagnosis Date  . Pregnancy induced hypertension   . Bilateral ovarian cysts    History reviewed. No pertinent past surgical history. History   Social History  . Marital Status: Single    Spouse Name: N/A    Number of Children: N/A  . Years of Education: N/A   Occupational History  . Not on file.   Social History Main Topics  . Smoking status: Former Smoker    Types: Cigarettes  . Smokeless tobacco: Not on file  . Alcohol Use: No     socially-not with pregnancy  . Drug Use: Yes    Special: Marijuana     Last use: "earlier today"  . Sexually Active: Yes    Birth Control/ Protection: None     "Tonight"   Other Topics Concern  . Not on file   Social History Narrative  . No narrative on file   No current facility-administered medications on file prior to encounter.   Current Outpatient Prescriptions on File Prior to Encounter  Medication Sig Dispense Refill  . Multiple Vitamin (MULITIVITAMIN WITH MINERALS) TABS Take 1 tablet by mouth daily.       Allergies  Allergen Reactions  . Orange Anaphylaxis and Hives    "My throat starts closing up, my tongue, lips started swelling & burning    ROS: Pertinent items in HPI  OBJECTIVE Blood pressure 133/86, pulse 91, temperature 98.2 F (36.8 C), temperature source Oral, resp. rate 20, height 5\' 6"   (1.676 m), weight 106.822 kg (235 lb 8 oz), last menstrual period 01/29/2011.  GENERAL: Well-developed, well-nourished female in mild distress.  HEENT: Normocephalic, good dentition HEART: normal rate RESP: normal effort ABDOMEN: Soft, nontender EXTREMITIES: Nontender, no edema NEURO: Alert and oriented, grossly neurologically intact SPECULUM EXAM: deferred BIMANUAL: deferred FHR 156  LAB RESULTS Results for orders placed during the hospital encounter of 05/18/11 (from the past 24 hour(s))  URINALYSIS, ROUTINE W REFLEX MICROSCOPIC     Status: Abnormal   Collection Time   05/18/11  1:22 AM      Component Value Range   Color, Urine YELLOW  YELLOW    APPearance CLEAR  CLEAR    Specific Gravity, Urine >1.030 (*) 1.005 - 1.030    pH 6.0  5.0 - 8.0    Glucose, UA NEGATIVE  NEGATIVE (mg/dL)   Hgb urine dipstick TRACE (*) NEGATIVE    Bilirubin Urine NEGATIVE  NEGATIVE    Ketones, ur NEGATIVE  NEGATIVE (mg/dL)   Protein, ur NEGATIVE  NEGATIVE (mg/dL)   Urobilinogen, UA 0.2  0.0 - 1.0 (mg/dL)   Nitrite NEGATIVE  NEGATIVE    Leukocytes, UA NEGATIVE  NEGATIVE   URINE MICROSCOPIC-ADD ON     Status: Abnormal   Collection Time   05/18/11  1:22 AM      Component Value Range  Squamous Epithelial / LPF MANY (*) RARE    WBC, UA 3-6  <3 (WBC/hpf)   RBC / HPF 0-2  <3 (RBC/hpf)   Bacteria, UA FEW (*) RARE    Crystals CA OXALATE CRYSTALS (*) NEGATIVE    Urine-Other MUCOUS PRESENT     ED course: HA and nausea resolved w/ LR bolus w/ Phenergan , decadron and Benadryl   ASSESSMENT 1. Migraine headache without aura   2. Normal pregnancy, repeat    PLAN D/C home per Dr. Clearance Coots for Dr. Gaynell Face Medication List  As of 05/18/2011  4:10 AM   START taking these medications         butalbital-acetaminophen-caffeine 50-325-40 MG per tablet   Commonly known as: FIORICET, ESGIC   Take 1-2 tablets by mouth every 6 (six) hours as needed for headache.      ondansetron 4 MG tablet   Commonly known as:  ZOFRAN   Take 1 tablet (4 mg total) by mouth every 8 (eight) hours as needed for nausea.         CONTINUE taking these medications         mulitivitamin with minerals Tabs      TYLENOL 500 MG tablet   Generic drug: acetaminophen          Where to get your medications    These are the prescriptions that you need to pick up.   You may get these medications from any pharmacy.         butalbital-acetaminophen-caffeine 50-325-40 MG per tablet   ondansetron 4 MG tablet           F/U in office as scheduled or MAU PRN if Sx worsen Increase rest and fluids  Shyne Lehrke 05/18/2011 4:11 AM

## 2011-05-18 NOTE — Progress Notes (Signed)
Written and verbal d/c instructions given and understanding voiced. 

## 2011-05-30 ENCOUNTER — Inpatient Hospital Stay (HOSPITAL_COMMUNITY)
Admission: AD | Admit: 2011-05-30 | Discharge: 2011-05-30 | Disposition: A | Discharge status: Home / Self Care | Payer: Medicaid Other | Source: Ambulatory Visit | Attending: Obstetrics | Admitting: Obstetrics

## 2011-05-30 ENCOUNTER — Encounter (HOSPITAL_COMMUNITY): Payer: Self-pay | Admitting: *Deleted

## 2011-05-30 DIAGNOSIS — R109 Unspecified abdominal pain: Secondary | ICD-10-CM | POA: Insufficient documentation

## 2011-05-30 DIAGNOSIS — Y92009 Unspecified place in unspecified non-institutional (private) residence as the place of occurrence of the external cause: Secondary | ICD-10-CM

## 2011-05-30 DIAGNOSIS — O99891 Other specified diseases and conditions complicating pregnancy: Secondary | ICD-10-CM | POA: Insufficient documentation

## 2011-05-30 DIAGNOSIS — M549 Dorsalgia, unspecified: Secondary | ICD-10-CM

## 2011-05-30 DIAGNOSIS — W19XXXA Unspecified fall, initial encounter: Secondary | ICD-10-CM

## 2011-05-30 DIAGNOSIS — W07XXXA Fall from chair, initial encounter: Secondary | ICD-10-CM | POA: Insufficient documentation

## 2011-05-30 HISTORY — DX: Headache: R51

## 2011-05-30 MED ORDER — CYCLOBENZAPRINE HCL 10 MG PO TABS: 10.0000 mg | ORAL_TABLET | Freq: Three times a day (TID) | ORAL | Status: AC | PRN

## 2011-05-30 MED ORDER — CYCLOBENZAPRINE HCL 10 MG PO TABS
10.0000 mg | ORAL_TABLET | Freq: Once | ORAL | Status: AC
  Administered 2011-05-30: 10 mg via ORAL
  Filled 2011-05-30: qty 1

## 2011-05-30 NOTE — MAU Note (Signed)
Fell yesterday out of her kitchen chair onto a hardwood floor around @ [phone];

## 2011-05-30 NOTE — MAU Provider Note (Signed)
History     CSN: [id number]  Arrival date and time: 05/30/11 1428   First Provider Initiated Contact with Patient 05/30/11 1511      Chief Complaint  Patient presents with  . Fall   HPI Rebecca Spence is 31 y.o. G2P0101 [redacted]w[redacted]d weeks presenting with back and abdominal pain.  Patient of Dr. Elsie Stain.   Reports climbing on a kitchen  Chair last night and chair fell backwards, patient fell with the chair landing on the wooden chair and tile that looks like wood.  No pain until this morning, it woke her up.  Wasn't so back, took at nap at 10am and at woke up at 2 with severe pain.  Hasn't taken anything for pain.  Rates as 10/10.   We dopplered  FHR 174    Past Medical History  Diagnosis Date  . Pregnancy induced hypertension   . Bilateral ovarian cysts   . Headache     History reviewed. No pertinent past surgical history.  Family History  Problem Relation Age of Onset  . Anesthesia problems Neg Hx   . Hypotension Neg Hx   . Malignant hyperthermia Neg Hx   . Pseudochol deficiency Neg Hx   . Diabetes Mother   . Hypertension Mother   . Diabetes Maternal Grandmother     History  Substance Use Topics  . Smoking status: Former Smoker    Types: Cigarettes  . Smokeless tobacco: Not on file  . Alcohol Use: No     socially-not with pregnancy    Allergies:  Allergies  Allergen Reactions  . Orange Anaphylaxis and Hives    "My throat starts closing up, my tongue, lips started swelling & burning    Prescriptions prior to admission  Medication Sig Dispense Refill  . acetaminophen (TYLENOL) 500 MG tablet Take 500 mg by mouth every 6 (six) hours as needed. For pain or headache      . Multiple Vitamin (MULITIVITAMIN WITH MINERALS) TABS Take 1 tablet by mouth at bedtime.         Review of Systems  Gastrointestinal: Positive for abdominal pain (lower abdominal pain).  Genitourinary:       Neg for vaginal bleeding.  Musculoskeletal: Positive for back pain (rates as a 10/10  mid  to lower back pain).   Physical Exam   Blood pressure 150/97, pulse 124, temperature 97.4 F (36.3 C), temperature source Oral, resp. rate 22, height 5\' 6"  (1.676 m), weight 107.502 kg (237 lb), last menstrual period 01/29/2011.  Physical Exam  Constitutional: She appears well-developed and well-nourished. No distress.  HENT:  Head: Normocephalic.  Neck: Normal range of motion.  Respiratory: Effort normal.  GI: There is tenderness (mild).  Musculoskeletal:       Lumbar back: She exhibits pain.    MAU Course  Procedures  MDM 15:25  Reported MSE to Dr. Gaynell Face.  Order given for Flexeril 10mg  tid, heating pad to her back and stop climbing in chairs.  Flexeril 10mg  po ordered to be given in MAU. 16:35  Patient reports that her pain after taking the Flexeril, is down to a 6/10.  She is asking to go home. Assessment and Plan  A:  Back pain resulting from a fall off of a chair      [redacted]w[redacted]d gestation  P:  Rx for Flexeril 10mg  1 po tid prn muscle spasm     She has Tylenol #3 at home for pain Instructed to use a heating pain on her back and  NOT to climb to avoid injury  Jaqualyn Juday,EVE M 05/30/2011, 3:13 PM

## 2011-05-30 NOTE — MAU Note (Signed)
Pt climbed up in a kitchen chair to reach something on the top shelf and the chair tilted backwards and she fell on her back yesterday afternoon around 1900; denies any pain yesterday or last night but this AM when she woke-up her lower back and lower abdomen started hurting; pain has gotten progressively worse and is tender to the touch; dopplered FHR @ 174; crying now and rating her pain at 10;

## 2011-05-30 NOTE — Discharge Instructions (Signed)
Back Pain, Adult Low back pain is very common. About 1 in 5 people have back pain.The cause of low back pain is rarely dangerous. The pain often gets better over time.About half of people with a sudden onset of back pain feel better in just 2 weeks. About 8 in 10 people feel better by 6 weeks.  CAUSES Some common causes of back pain include:  Strain of the muscles or ligaments supporting the spine.   Wear and tear (degeneration) of the spinal discs.   Arthritis.   Direct injury to the back.  DIAGNOSIS Most of the time, the direct cause of low back pain is not known.However, back pain can be treated effectively even when the exact cause of the pain is unknown.Answering your caregiver's questions about your overall health and symptoms is one of the most accurate ways to make sure the cause of your pain is not dangerous. If your caregiver needs more information, he or she may order lab work or imaging tests (X-rays or MRIs).However, even if imaging tests show changes in your back, this usually does not require surgery. HOME CARE INSTRUCTIONS For many people, back pain returns.Since low back pain is rarely dangerous, it is often a condition that people can learn to manageon their own.   Remain active. It is stressful on the back to sit or stand in one place. Do not sit, drive, or stand in one place for more than 30 minutes at a time. Take short walks on level surfaces as soon as pain allows.Try to increase the length of time you walk each day.   Do not stay in bed.Resting more than 1 or 2 days can delay your recovery.   Do not avoid exercise or work.Your body is made to move.It is not dangerous to be active, even though your back may hurt.Your back will likely heal faster if you return to being active before your pain is gone.   Pay attention to your body when you bend and lift. Many people have less discomfortwhen lifting if they bend their knees, keep the load close to their  bodies,and avoid twisting. Often, the most comfortable positions are those that put less stress on your recovering back.   Find a comfortable position to sleep. Use a firm mattress and lie on your side with your knees slightly bent. If you lie on your back, put a pillow under your knees.   Only take over-the-counter or prescription medicines as directed by your caregiver. Over-the-counter medicines to reduce pain and inflammation are often the most helpful.Your caregiver may prescribe muscle relaxant drugs.These medicines help dull your pain so you can more quickly return to your normal activities and healthy exercise.   Put ice on the injured area.   Put ice in a plastic bag.   Place a towel between your skin and the bag.   Leave the ice on for 15 to 20 minutes, 3 to 4 times a day for the first 2 to 3 days. After that, ice and heat may be alternated to reduce pain and spasms.   Ask your caregiver about trying back exercises and gentle massage. This may be of some benefit.   Avoid feeling anxious or stressed.Stress increases muscle tension and can worsen back pain.It is important to recognize when you are anxious or stressed and learn ways to manage it.Exercise is a great option.  SEEK MEDICAL CARE IF:  You have pain that is not relieved with rest or medicine.   You have   pain that does not improve in 1 week.   You have new symptoms.   You are generally not feeling well.  SEEK IMMEDIATE MEDICAL CARE IF:   You have pain that radiates from your back into your legs.   You develop new bowel or bladder control problems.   You have unusual weakness or numbness in your arms or legs.   You develop nausea or vomiting.   You develop abdominal pain.   You feel faint.  Document Released: 01/07/2005 Document Revised: 12/27/2010 Document Reviewed: 05/28/2010 [facility] Patient Information 2012 [place], [place].Fall Prevention  Falls cause injuries and can affect all age groups. It is  possible to prevent falls.  HOME CARE  Wear shoes with non-slip soles (not slippers).   Have your home and outside area well lit.   Use night lights throughout your home.   Remove clutter.   Clean up floor spills.   Remove throw rugs or fasten them to the floor with carpet tape.   Do not place electrical cords across pathways.   Put grab bars by your tub, shower, and toilet. Do not use towel bars as grab bars.   Put handrails on both sides of the stairway.   Do not climb on stools or stepladders.   Do not wax your floors.   Repair uneven or unsafe sidewalks, walkways, or stairs.   Keep items you use a lot within reach.   Be aware of pets.   Change positions slowly.   Sit up on the side of your bed for a few minutes before standing. This prevents dizziness.   Keep a bedside commode by your bed at night.   If you have a cane or walker, be sure to use it whenever you are walking.   Have your eyes and hearing checked every year.  Ask your doctor what other things you can do to prevent falls. GET HELP RIGHT AWAY IF:  You feel dizzy, weak, or unsteady on your feet.   You feel confused.   You fall and hurt yourself.  Document Released: 11/03/2008 Document Revised: 12/27/2010 Document Reviewed: 11/03/2008 [facility] Patient Information 2012 [place], [place].

## 2011-06-14 ENCOUNTER — Other Ambulatory Visit: Payer: Self-pay

## 2011-07-17 ENCOUNTER — Other Ambulatory Visit: Payer: Self-pay | Admitting: Obstetrics

## 2011-07-26 ENCOUNTER — Other Ambulatory Visit: Payer: Self-pay | Admitting: Obstetrics

## 2011-08-08 ENCOUNTER — Ambulatory Visit (HOSPITAL_COMMUNITY)
Admission: RE | Admit: 2011-08-08 | Discharge: 2011-08-08 | Disposition: A | Discharge status: Home / Self Care | Payer: Medicaid Other | Source: Ambulatory Visit | Attending: Obstetrics | Admitting: Obstetrics

## 2011-08-08 ENCOUNTER — Encounter: Payer: Medicaid Other | Attending: Obstetrics | Admitting: Dietician

## 2011-08-08 ENCOUNTER — Other Ambulatory Visit: Payer: Self-pay | Admitting: Obstetrics

## 2011-08-08 DIAGNOSIS — O99810 Abnormal glucose complicating pregnancy: Secondary | ICD-10-CM | POA: Insufficient documentation

## 2011-08-08 DIAGNOSIS — Z713 Dietary counseling and surveillance: Secondary | ICD-10-CM | POA: Insufficient documentation

## 2011-08-09 ENCOUNTER — Other Ambulatory Visit: Payer: Self-pay

## 2011-08-09 NOTE — ED Notes (Signed)
Diabetes Education:08/08/2011   G2P1 pt of Dr Gaynell Face was  seen for GDM counseling.  Notes no issues with increased blood glucose in the first pregnancy.  Her mother has type 2 DM and is on insulin therapy.  HT: 66.5 in Current wt 245.75 lb. Pre-pregnancy WT: 239 lb EDD: 11/02/2011.  Gives a report of allergies to oranges and other citrus foods.  3 Hr OGTT: Fasting=98 1 hr= 201, 2 hr= 178, 3 hr= 186.  Currently not walking or exercising.  Completed a review of the carb restricted diet for GDM, blood glucose monitoring, and post-delivery self-care.  Provided handouts: Nutrition, Diabetes and Pregnancy, Carb Counting Guide.  Provided a glucose meter, an Accu-Chek SmartView meter kit.  Lot [id number] Exp: 10/20/2012.  On return demonstration, her post breakfast/lunch level at 2.75 hrs was 138 mg/dl.  She has my card and is to call or e-mail me with questions. She was instructed to call in her blood glucose levels on Monday, July 22, to Humphrey Rolls, RN at [phone].  Letter to Dr. Gaynell Face to follow.  Maggie Averianna Brugger, RN, RD, CDE

## 2011-08-12 ENCOUNTER — Other Ambulatory Visit: Payer: Self-pay

## 2011-08-13 ENCOUNTER — Telehealth (HOSPITAL_COMMUNITY): Payer: Self-pay | Admitting: *Deleted

## 2011-08-13 NOTE — Telephone Encounter (Signed)
Pt called on Monday after office hours to call in blood sugars.  Called pt back to day and made pt aware that office is closed after 12pm on mondays.  Recorded blood sugars on log, reviewed with Dr. Otho Perl.  Will monitor for one more week.

## 2011-08-16 NOTE — Progress Notes (Signed)
MFM Note  Rebecca Spence is a 31 y.o.G2P0101 AA female at 27+ weeks who presents for consultation regarding her newly diagnosed gestational diabetes. Her prenatal course has been unremarkable up to this point. [name] denies any history of diabetes in a pregnant or nonpregnant state. Her 3 hour OGTT results were as follows: 98, 261, 178 and 186.   OB history:  G1: vaginal delivery of a 8+6 female; induced secondary to hypertension G2: current pregnancy; BP normal  Medical history: none Surgical history: none Allergies: no medication or drug allergies; oranges - hives Social: denies ETOH and tobacco use; + marijuana use Family history: noncontributory for congenital anomalies or inheritable disorders  Assessment: 1) IUP at 27+ weeks 2) Gestational diabetes 3) H/O PIH 4) Marijuana use  Rebecca Spence had diabetic teaching today by [facility]. She understands the goals of therapy include fasting blood sugars less than 90 mg/dl and 2-hour postprandial less than 120 mg/dl with avoidance of hypoglycemia. As pregnancy progresses if diet therapy is not successful in achieving adequate glycemic control, she will require medical therapy. I recommend first starting glyburide 2.5 mg orally with breakfast and increasing as needed to reach desired glycemic goals. If the maximum daily dose of glyburide is reached (10 mg bid) and glycemic control remains inadequate, the patient should be stared on insulin. If the patient requires medical treatment of her diabetes then fetal surveillance should be instituted with interval serial growth ultrasounds every 4 weeks and twice weekly antepartum testing beginning at 32 weeks.   We reviewed that the goal of good glycemic control is to prevent macrosomia and the subsequent complications such as birth trauma, increased need for C/S and metabolic disturbances in the neonate.  Rebecca Spence will start the diabetic diet and begin checking fasting and 2 hour postprandial blood  sugars. She was asked to contact us within a week with her BSs.  Thank you for the kind referral.  (Face-to-face consultation with patient: 20 min)

## 2011-08-19 ENCOUNTER — Other Ambulatory Visit: Payer: Self-pay

## 2011-08-19 ENCOUNTER — Telehealth (HOSPITAL_COMMUNITY): Payer: Self-pay | Admitting: *Deleted

## 2011-08-19 NOTE — Telephone Encounter (Signed)
Pt called with BSG's for the week.  Reviewed results with Dr. Claudean Severance.  Prescription called in to [facility] on [address]. For Glyburide 5mg  QHS #30, Refill 2. Called pt and made aware of prescription.  Instructed pt to notify Dr. Gaynell Face of prescription at appointment tomorrow and to continue calling in sugars on Monday.  Pt verbalized understanding.

## 2011-08-26 ENCOUNTER — Other Ambulatory Visit: Payer: Self-pay

## 2011-08-26 ENCOUNTER — Telehealth (HOSPITAL_COMMUNITY): Payer: Self-pay | Admitting: *Deleted

## 2011-08-26 NOTE — Telephone Encounter (Signed)
Pt called in blood sugars.  Recorded and shown to Dr. Claudean Severance.  Orders to increase glyburide from 5mg  to 7.5mg  at bedtime.  Instructed pt to increase from 1 tab to 1.5 tablets. Pt verbalized understanding.  Also discussed beginning twice weekly testing at 32 weeks.  Scheduled appointment for 8/20.  See scanned documents for blood sugars.

## 2011-09-02 ENCOUNTER — Telehealth (HOSPITAL_COMMUNITY): Payer: Self-pay | Admitting: *Deleted

## 2011-09-02 NOTE — Telephone Encounter (Signed)
Pt called in blood sugars.  Reviewed with Dr. Sherrie George.  Pt to increase PM dose of Glyburide from 7.5mg  to 10mg  and begin glyburide 2.5mg  in am with breakfast.  Instructed pt on symptoms of low blood sugars, and treatment of.  Instructed pt to call with any lows so adjustments can be made.  Pt verbalized understanding.  Pt aware of appointment next Tuesday and instructed pt to bring sugar log with her then.

## 2011-09-10 ENCOUNTER — Telehealth (HOSPITAL_COMMUNITY): Payer: Self-pay | Admitting: *Deleted

## 2011-09-10 ENCOUNTER — Ambulatory Visit (HOSPITAL_COMMUNITY)
Admission: RE | Admit: 2011-09-10 | Discharge: 2011-09-10 | Disposition: A | Discharge status: Home / Self Care | Payer: Medicaid Other | Source: Ambulatory Visit | Attending: Obstetrics | Admitting: Obstetrics

## 2011-09-10 ENCOUNTER — Encounter (HOSPITAL_COMMUNITY): Payer: Self-pay

## 2011-09-10 DIAGNOSIS — O99810 Abnormal glucose complicating pregnancy: Secondary | ICD-10-CM | POA: Insufficient documentation

## 2011-09-10 HISTORY — DX: Gestational diabetes mellitus in pregnancy, unspecified control: O24.419

## 2011-09-10 NOTE — ED Notes (Signed)
Pt forgot to bring blood sugar log with her today.  Will call in results over phone when she gets home.

## 2011-09-10 NOTE — Telephone Encounter (Signed)
Blood sugars called in by patient this afternoon.  Reviewed with Dr. Claudean Severance.  Will need to begin pt on Insulin.  Called Maggie May to discuss insulin education with pt.  Left message for Seward Grater to call back.  Ok per Dr. Claudean Severance for pt to continue Glyburide tonight and tomorrow while waiting on diabetic educator.  Will increase glyburide am dose from 2.5mg  to 5mg  while waiting on insulin therapy to start.  Discussed this with pt.  Pt verbalized understanding.

## 2011-09-11 ENCOUNTER — Telehealth (HOSPITAL_COMMUNITY): Payer: Self-pay | Admitting: *Deleted

## 2011-09-11 NOTE — Telephone Encounter (Signed)
Rebecca Spence called back with availability Friday 8/23 during patient's scheduled appointment.  Ok'd with Dr. Claudean Severance.  Pt notified of Rebecca Spence coming to her appointment on Friday here at MFM to do insulin education.  Pt verbalized understanding.

## 2011-09-13 ENCOUNTER — Other Ambulatory Visit: Payer: Self-pay | Admitting: Obstetrics

## 2011-09-13 ENCOUNTER — Ambulatory Visit (HOSPITAL_COMMUNITY)
Admission: RE | Admit: 2011-09-13 | Discharge: 2011-09-13 | Disposition: A | Discharge status: Home / Self Care | Payer: Medicaid Other | Source: Ambulatory Visit | Attending: Obstetrics | Admitting: Obstetrics

## 2011-09-13 ENCOUNTER — Encounter: Payer: Medicaid Other | Attending: Obstetrics | Admitting: Dietician

## 2011-09-13 ENCOUNTER — Other Ambulatory Visit (HOSPITAL_COMMUNITY): Payer: Self-pay | Admitting: Obstetrics

## 2011-09-13 DIAGNOSIS — O288 Other abnormal findings on antenatal screening of mother: Secondary | ICD-10-CM

## 2011-09-13 DIAGNOSIS — O99810 Abnormal glucose complicating pregnancy: Secondary | ICD-10-CM | POA: Insufficient documentation

## 2011-09-13 DIAGNOSIS — Z3689 Encounter for other specified antenatal screening: Secondary | ICD-10-CM | POA: Insufficient documentation

## 2011-09-13 DIAGNOSIS — Z713 Dietary counseling and surveillance: Secondary | ICD-10-CM | POA: Insufficient documentation

## 2011-09-13 DIAGNOSIS — O24919 Unspecified diabetes mellitus in pregnancy, unspecified trimester: Secondary | ICD-10-CM

## 2011-09-13 NOTE — ED Notes (Signed)
Diabetes Educator:  Seen today for increasing blood glucose levels even with the use of Glyburide.  Fasting levels:118-11-155-226 mg/dl.  PC Breakfast: 134-160-270 mg.  PC Lunch: 161-096-045 mg  PC Dinner: [phone] mg.  Dr. Maurie Boettcher has recommended starting on insulin and ordered. NPH 36 units in the AM and 14 units at bedtime.  Novolog 10 units before each meal.  Today, she was instructed in mixing the insulin for the [facility] breakfast dose of NPH and Novolog.  Instructed to inject Novolog 10-15 minutes before the meal  [name] did a return demonstration of mixing the insulin and did a return injection demonstration in her RT thigh.  Prescriptions for insulin and syringes written by Dr. Sherrie George.  Rebecca Spence is to start the insulin today and to D/C the Glyburide when starting starting the insulin.  To call in her glucose levels on Monday, 09/16/11.  Instructed on the need for and the appropriate use of glucose tablets.  Maggie Markies Mowatt, RN, RD, CDE
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
## 2011-09-16 ENCOUNTER — Telehealth (HOSPITAL_COMMUNITY): Payer: Self-pay | Admitting: *Deleted

## 2011-09-16 NOTE — Telephone Encounter (Signed)
Korynne called in her blood sugars after beginning insulin therapy this weekend.  Reviewed blood sugars with Dr. Claudean Severance.  Will view sugars again when pt comes in tomorrow for NST. See scanned documents for blood sugars.

## 2011-09-17 ENCOUNTER — Other Ambulatory Visit: Payer: Self-pay

## 2011-09-17 ENCOUNTER — Ambulatory Visit (HOSPITAL_COMMUNITY)
Admission: RE | Admit: 2011-09-17 | Discharge: 2011-09-17 | Disposition: A | Discharge status: Home / Self Care | Payer: Medicaid Other | Source: Ambulatory Visit | Attending: Obstetrics | Admitting: Obstetrics

## 2011-09-17 DIAGNOSIS — O99810 Abnormal glucose complicating pregnancy: Secondary | ICD-10-CM | POA: Insufficient documentation

## 2011-09-17 NOTE — ED Notes (Signed)
Dr. Claudean Severance viewed blood sugar logs.  Adjustments made to pt's insulin.  Pt's previous dosage was NPH 36 units am, 14 units pm, and Humalog 10 units with breakfast, lunch and dinner.  Dosage changed to NPH 36 units am and increase to 16 units pm. And Humalog 10 units with breakfast and dinner with an increase to 12 units with lunch.  Written down and given to pt.  Pt verbalized understanding.

## 2011-09-20 ENCOUNTER — Ambulatory Visit (HOSPITAL_COMMUNITY)
Admission: RE | Admit: 2011-09-20 | Discharge: 2011-09-20 | Disposition: A | Discharge status: Home / Self Care | Payer: Medicaid Other | Source: Ambulatory Visit | Attending: Obstetrics | Admitting: Obstetrics

## 2011-09-20 ENCOUNTER — Encounter (HOSPITAL_COMMUNITY): Payer: Self-pay

## 2011-09-20 ENCOUNTER — Other Ambulatory Visit (HOSPITAL_COMMUNITY): Payer: Self-pay | Admitting: Maternal and Fetal Medicine

## 2011-09-20 ENCOUNTER — Other Ambulatory Visit: Payer: Self-pay | Admitting: Obstetrics

## 2011-09-20 DIAGNOSIS — O99810 Abnormal glucose complicating pregnancy: Secondary | ICD-10-CM | POA: Insufficient documentation

## 2011-09-20 DIAGNOSIS — O24419 Gestational diabetes mellitus in pregnancy, unspecified control: Secondary | ICD-10-CM

## 2011-09-20 NOTE — Progress Notes (Signed)
Rebecca Spence  was seen today for an ultrasound appointment.  See full report in [facility].  Alpha Gula, MD  Single IUP at 33 3/7 weeks  No fetal anomalies noted; limited views of the anatomy were obtained due to late gestational age The estimated fetal weight today is > 90th %tile Normal amniotic fluid volume  Continue twice weekly NSTs with weekly AFIs   Follow up growth scan in 3 weeks

## 2011-09-20 NOTE — ED Notes (Signed)
Dr. Claudean Severance reviewed patient's CBG log. No changes made to insulin regimen. Patient's CBG log scanned into Epic and should be under lab tab.

## 2011-09-20 NOTE — Addendum Note (Signed)
Encounter addended by: Alessandra Bevels. Chase Picket, RN on: 09/20/2011  1:26 PM<BR>     Documentation filed: Notes Section

## 2011-09-24 ENCOUNTER — Ambulatory Visit (HOSPITAL_COMMUNITY)
Admission: RE | Admit: 2011-09-24 | Discharge: 2011-09-24 | Disposition: A | Discharge status: Home / Self Care | Payer: Medicaid Other | Source: Ambulatory Visit | Attending: Obstetrics | Admitting: Obstetrics

## 2011-09-24 ENCOUNTER — Encounter (HOSPITAL_COMMUNITY): Payer: Self-pay

## 2011-09-24 DIAGNOSIS — O99810 Abnormal glucose complicating pregnancy: Secondary | ICD-10-CM | POA: Insufficient documentation

## 2011-09-24 NOTE — ED Notes (Signed)
Blood sugars reviewed with Dr. Sherrie George.  Not changes to insulin.  Blood sugar log faxed to Dr. Gaynell Face and scanned into epic.

## 2011-09-27 ENCOUNTER — Ambulatory Visit (HOSPITAL_COMMUNITY)
Admission: RE | Admit: 2011-09-27 | Discharge: 2011-09-27 | Disposition: A | Discharge status: Home / Self Care | Payer: Medicaid Other | Source: Ambulatory Visit | Attending: Obstetrics | Admitting: Obstetrics

## 2011-09-27 DIAGNOSIS — O24419 Gestational diabetes mellitus in pregnancy, unspecified control: Secondary | ICD-10-CM

## 2011-09-27 DIAGNOSIS — O99810 Abnormal glucose complicating pregnancy: Secondary | ICD-10-CM | POA: Insufficient documentation

## 2011-09-27 DIAGNOSIS — O99210 Obesity complicating pregnancy, unspecified trimester: Secondary | ICD-10-CM | POA: Insufficient documentation

## 2011-09-27 DIAGNOSIS — E669 Obesity, unspecified: Secondary | ICD-10-CM | POA: Insufficient documentation

## 2011-09-27 NOTE — Addendum Note (Signed)
Encounter addended by: Duanne Moron, RN on: 09/27/2011 11:12 AM<BR>     Documentation filed: Normajean Glasgow VN

## 2011-09-27 NOTE — ED Notes (Signed)
Blood sugars reviewed with Dr. Sherrie George.  Faxed to Dr. Gaynell Face and scanned into epic.

## 2011-10-01 ENCOUNTER — Ambulatory Visit (HOSPITAL_COMMUNITY)
Admission: RE | Admit: 2011-10-01 | Discharge: 2011-10-01 | Disposition: A | Discharge status: Home / Self Care | Payer: Medicaid Other | Source: Ambulatory Visit | Attending: Obstetrics | Admitting: Obstetrics

## 2011-10-01 DIAGNOSIS — O99210 Obesity complicating pregnancy, unspecified trimester: Secondary | ICD-10-CM | POA: Insufficient documentation

## 2011-10-01 DIAGNOSIS — E669 Obesity, unspecified: Secondary | ICD-10-CM | POA: Insufficient documentation

## 2011-10-01 DIAGNOSIS — O99810 Abnormal glucose complicating pregnancy: Secondary | ICD-10-CM | POA: Insufficient documentation

## 2011-10-03 ENCOUNTER — Ambulatory Visit (HOSPITAL_COMMUNITY)
Admission: RE | Admit: 2011-10-03 | Discharge: 2011-10-03 | Disposition: A | Discharge status: Home / Self Care | Payer: Medicaid Other | Source: Ambulatory Visit | Attending: Obstetrics | Admitting: Obstetrics

## 2011-10-03 ENCOUNTER — Other Ambulatory Visit: Payer: Self-pay

## 2011-10-03 ENCOUNTER — Encounter (HOSPITAL_COMMUNITY): Payer: Self-pay | Admitting: *Deleted

## 2011-10-03 ENCOUNTER — Inpatient Hospital Stay (HOSPITAL_COMMUNITY)
Admission: AD | Admit: 2011-10-03 | Discharge: 2011-10-03 | Disposition: A | Discharge status: Home / Self Care | Payer: Medicaid Other | Source: Ambulatory Visit | Attending: Obstetrics | Admitting: Obstetrics

## 2011-10-03 ENCOUNTER — Inpatient Hospital Stay (HOSPITAL_COMMUNITY): Payer: Medicaid Other

## 2011-10-03 DIAGNOSIS — O4700 False labor before 37 completed weeks of gestation, unspecified trimester: Secondary | ICD-10-CM | POA: Insufficient documentation

## 2011-10-03 DIAGNOSIS — O24419 Gestational diabetes mellitus in pregnancy, unspecified control: Secondary | ICD-10-CM

## 2011-10-03 DIAGNOSIS — O99891 Other specified diseases and conditions complicating pregnancy: Secondary | ICD-10-CM | POA: Insufficient documentation

## 2011-10-03 DIAGNOSIS — O212 Late vomiting of pregnancy: Secondary | ICD-10-CM | POA: Insufficient documentation

## 2011-10-03 DIAGNOSIS — N949 Unspecified condition associated with female genital organs and menstrual cycle: Secondary | ICD-10-CM

## 2011-10-03 DIAGNOSIS — O219 Vomiting of pregnancy, unspecified: Secondary | ICD-10-CM

## 2011-10-03 DIAGNOSIS — O99810 Abnormal glucose complicating pregnancy: Secondary | ICD-10-CM | POA: Insufficient documentation

## 2011-10-03 DIAGNOSIS — E669 Obesity, unspecified: Secondary | ICD-10-CM | POA: Insufficient documentation

## 2011-10-03 DIAGNOSIS — O26899 Other specified pregnancy related conditions, unspecified trimester: Secondary | ICD-10-CM

## 2011-10-03 DIAGNOSIS — R102 Pelvic and perineal pain: Secondary | ICD-10-CM

## 2011-10-03 LAB — COMPREHENSIVE METABOLIC PANEL
ALT: 11 U/L (ref 0–35)
AST: 15 U/L (ref 0–37)
Alkaline Phosphatase: 162 U/L — ABNORMAL HIGH (ref 39–117)
CO2: 20 mEq/L (ref 19–32)
Chloride: 103 mEq/L (ref 96–112)
GFR calc Af Amer: >90 mL/min (ref >90)
GFR calc non Af Amer: >90 mL/min (ref >90)
Glucose, Bld: 93 mg/dL (ref 70–99)
Potassium: 3.8 mEq/L (ref 3.5–5.1)
Sodium: 134 mEq/L — ABNORMAL LOW (ref 135–145)

## 2011-10-03 LAB — URINALYSIS, ROUTINE W REFLEX MICROSCOPIC
Bilirubin Urine: NEGATIVE
Nitrite: NEGATIVE
Specific Gravity, Urine: 1.025 (ref 1.005–1.030)
pH: 6 (ref 5.0–8.0)

## 2011-10-03 LAB — CBC
Hemoglobin: 11.4 g/dL — ABNORMAL LOW (ref 12.0–15.0)
RBC: 3.83 MIL/uL — ABNORMAL LOW (ref 3.87–5.11)
WBC: 14.6 10*3/uL — ABNORMAL HIGH (ref 4.0–10.5)

## 2011-10-03 LAB — URINE MICROSCOPIC-ADD ON

## 2011-10-03 LAB — GLUCOSE, CAPILLARY: Glucose-Capillary: 83 mg/dL (ref 70–99)

## 2011-10-03 MED ORDER — ONDANSETRON 8 MG PO TBDP
8.0000 mg | ORAL_TABLET | Freq: Once | ORAL | Status: AC
  Administered 2011-10-03: 8 mg via ORAL
  Filled 2011-10-03: qty 1

## 2011-10-03 MED ORDER — NIFEDIPINE 10 MG PO CAPS
20.0000 mg | ORAL_CAPSULE | Freq: Once | ORAL | Status: AC
  Administered 2011-10-03: 20 mg via ORAL
  Filled 2011-10-03: qty 2

## 2011-10-03 NOTE — MAU Note (Signed)
Pt was at MFM appointment and started having increased pelvic pain and pressure. NST at MFM pt ctx 5-6 min.

## 2011-10-03 NOTE — ED Notes (Signed)
Pt began vomiting and stating she was having pressure in bottom while waiting for AFI.  Taken to MAU in wheelchair to Room 5.

## 2011-10-03 NOTE — MAU Provider Note (Signed)
History     CSN: [id number]  Arrival date and time: 10/03/11 1221   First Provider Initiated Contact with Patient 10/03/11 1241      Chief Complaint  Patient presents with  . Pelvic Pain   HPI 31 y.o. G2P0101 at [redacted]w[redacted]d with nausea x a few weeks, vomiting starting today, pelvic pressure starting since onset of vomiting. Was at MFM for AFI, NST, UCs q 4-5 minutes on monitor, pt started vomiting and c/o pressure, sent to MAU from there. Denies bleeding or LOF.    Past Medical History  Diagnosis Date  . Pregnancy induced hypertension   . Bilateral ovarian cysts   . Headache   . Gestational diabetes     History reviewed. No pertinent past surgical history.  Family History  Problem Relation Age of Onset  . Anesthesia problems Neg Hx   . Hypotension Neg Hx   . Malignant hyperthermia Neg Hx   . Pseudochol deficiency Neg Hx   . Diabetes Mother   . Hypertension Mother   . Diabetes Maternal Grandmother     History  Substance Use Topics  . Smoking status: Former Smoker    Types: Cigarettes  . Smokeless tobacco: Not on file  . Alcohol Use: No     socially-not with pregnancy    Allergies:  Allergies  Allergen Reactions  . Orange Anaphylaxis and Hives    "My throat starts closing up, my tongue, lips started swelling & burning    No prescriptions prior to admission    Review of Systems  Constitutional: Negative.   Respiratory: Negative.   Cardiovascular: Negative.   Gastrointestinal: Positive for nausea and vomiting. Negative for abdominal pain, diarrhea and constipation.  Genitourinary: Negative for dysuria, urgency, frequency, hematuria and flank pain.       Negative for vaginal bleeding, cramping/contractions  Musculoskeletal:       Pain in legs and feet   Neurological: Negative.   Psychiatric/Behavioral: Negative.    Physical Exam   Blood pressure 110/67, pulse 92, temperature 97.6 F (36.4 C), temperature source Oral, resp. rate 18, last menstrual period  01/29/2011.  Physical Exam  Nursing note and vitals reviewed. Constitutional: She is oriented to person, place, and time. She appears well-developed and well-nourished. She appears distressed.  Cardiovascular: Normal rate.   Respiratory: Effort normal.  GI: Soft. There is no tenderness.  Genitourinary:       SVE: 1/thick/high   Musculoskeletal: Normal range of motion.  Neurological: She is alert and oriented to person, place, and time.  Skin: Skin is warm. She is diaphoretic.  Psychiatric: She has a normal mood and affect.   EFM reactive: TOCO: initially q2-3 min, then irreg following Procardia 20 mg  MAU Course  Procedures  Results for orders placed during the hospital encounter of 10/03/11 (from the past 24 hour(s))  CBC     Status: Abnormal   Collection Time   10/03/11 12:42 PM      Component Value Range   WBC 14.6 (*) 4.0 - 10.5 K/uL   RBC 3.83 (*) 3.87 - 5.11 MIL/uL   Hemoglobin 11.4 (*) 12.0 - 15.0 g/dL   HCT 13.0 (*) 86.5 - 78.4 %   MCV 90.1  78.0 - 100.0 fL   MCH 29.8  26.0 - 34.0 pg   MCHC 33.0  30.0 - 36.0 g/dL   RDW 69.6  29.5 - 28.4 %   Platelets 224  150 - 400 K/uL  COMPREHENSIVE METABOLIC PANEL     Status: Abnormal  Collection Time   10/03/11 12:42 PM      Component Value Range   Sodium 134 (*) 135 - 145 mEq/L   Potassium 3.8  3.5 - 5.1 mEq/L   Chloride 103  96 - 112 mEq/L   CO2 20  19 - 32 mEq/L   Glucose, Bld 93  70 - 99 mg/dL   BUN 3 (*) 6 - 23 mg/dL   Creatinine, Ser 9.60  0.50 - 1.10 mg/dL   Calcium 9.3  8.4 - 45.4 mg/dL   Total Protein 6.6  6.0 - 8.3 g/dL   Albumin 2.7 (*) 3.5 - 5.2 g/dL   AST 15  0 - 37 U/L   ALT 11  0 - 35 U/L   Alkaline Phosphatase 162 (*) 39 - 117 U/L   Total Bilirubin 0.4  0.3 - 1.2 mg/dL   GFR calc non Af Amer >90  >90 mL/min   GFR calc Af Amer >90  >90 mL/min  URINALYSIS, ROUTINE W REFLEX MICROSCOPIC     Status: Abnormal   Collection Time   10/03/11 12:50 PM      Component Value Range   Color, Urine YELLOW  YELLOW    APPearance CLEAR  CLEAR   Specific Gravity, Urine 1.025  1.005 - 1.030   pH 6.0  5.0 - 8.0   Glucose, UA NEGATIVE  NEGATIVE mg/dL   Hgb urine dipstick NEGATIVE  NEGATIVE   Bilirubin Urine NEGATIVE  NEGATIVE   Ketones, ur NEGATIVE  NEGATIVE mg/dL   Protein, ur NEGATIVE  NEGATIVE mg/dL   Urobilinogen, UA 0.2  0.0 - 1.0 mg/dL   Nitrite NEGATIVE  NEGATIVE   Leukocytes, UA SMALL (*) NEGATIVE  URINE MICROSCOPIC-ADD ON     Status: Normal   Collection Time   10/03/11 12:50 PM      Component Value Range   Squamous Epithelial / LPF RARE  RARE   WBC, UA 3-6  <3 WBC/hpf   RBC / HPF 0-2  <3 RBC/hpf   Bacteria, UA RARE  RARE   Urine-Other MUCOUS PRESENT    GLUCOSE, CAPILLARY     Status: Normal   Collection Time   10/03/11 12:58 PM      Component Value Range   Glucose-Capillary 83  70 - 99 mg/dL   U/S: AFI 16  No change in cervical exam during MAU visit.  Assessment and Plan  31 y.o. G2P0101 at [redacted]w[redacted]d 1. Pelvic pain in pregnancy   2. Threatened preterm labor   3. Nausea and vomiting in pregnancy      Medication List     As of 10/03/2011  4:55 PM    CONTINUE taking these medications         insulin lispro 100 UNIT/ML injection   Commonly known as: HUMALOG      insulin NPH 100 UNIT/ML injection   Commonly known as: HUMULIN N,NOVOLIN N      prenatal multivitamin Tabs         Pt has Zofran at home as needed for nausea/vomiting  Follow up as scheduled  Naphtali Riede 10/03/2011, 4:54 PM

## 2011-10-06 ENCOUNTER — Other Ambulatory Visit: Payer: Self-pay

## 2011-10-07 ENCOUNTER — Telehealth (HOSPITAL_COMMUNITY): Payer: Self-pay | Admitting: *Deleted

## 2011-10-07 ENCOUNTER — Encounter (HOSPITAL_COMMUNITY): Payer: Self-pay | Admitting: *Deleted

## 2011-10-07 NOTE — Telephone Encounter (Signed)
Preadmission screen  

## 2011-10-08 ENCOUNTER — Ambulatory Visit (HOSPITAL_COMMUNITY)
Admission: RE | Admit: 2011-10-08 | Discharge: 2011-10-08 | Disposition: A | Discharge status: Home / Self Care | Payer: Medicaid Other | Source: Ambulatory Visit | Attending: Obstetrics | Admitting: Obstetrics

## 2011-10-08 DIAGNOSIS — O99810 Abnormal glucose complicating pregnancy: Secondary | ICD-10-CM | POA: Insufficient documentation

## 2011-10-10 ENCOUNTER — Other Ambulatory Visit: Payer: Self-pay

## 2011-10-11 ENCOUNTER — Ambulatory Visit (HOSPITAL_COMMUNITY)
Admission: RE | Admit: 2011-10-11 | Discharge: 2011-10-11 | Disposition: A | Discharge status: Home / Self Care | Payer: Medicaid Other | Source: Ambulatory Visit | Attending: Obstetrics | Admitting: Obstetrics

## 2011-10-11 DIAGNOSIS — R109 Unspecified abdominal pain: Secondary | ICD-10-CM | POA: Insufficient documentation

## 2011-10-11 DIAGNOSIS — O24419 Gestational diabetes mellitus in pregnancy, unspecified control: Secondary | ICD-10-CM

## 2011-10-11 DIAGNOSIS — O99810 Abnormal glucose complicating pregnancy: Secondary | ICD-10-CM | POA: Insufficient documentation

## 2011-10-11 DIAGNOSIS — O99891 Other specified diseases and conditions complicating pregnancy: Secondary | ICD-10-CM | POA: Insufficient documentation

## 2011-10-11 NOTE — Progress Notes (Signed)
Rebecca Spence had an ultrasound appointment today.  Please see AS-OB/GYN report for details.  Comments There is an active singleton fetus with no apparent dysmorphic features on today's routine anatomic re-examination.  The biometry suggests a fetus with an EFW at the approximately 82nd percentile for gestational age.    Impression Active singleton fetus. Normal interval growth. Normal amniotic fluid volume   Recommendations 1. Twice weekly NSTs and weekly AFI's have been previously scheduled for your patient. 2. Follow as clinically indicated.  Rogelia Boga, MD, MS, FACOG Assistant Professor Section of Maternal-Fetal Medicine [facility]

## 2011-10-12 ENCOUNTER — Inpatient Hospital Stay (HOSPITAL_COMMUNITY)
Admission: AD | Admit: 2011-10-12 | Discharge: 2011-10-12 | Disposition: A | Discharge status: Home / Self Care | Payer: Medicaid Other | Source: Ambulatory Visit | Attending: Obstetrics | Admitting: Obstetrics

## 2011-10-12 ENCOUNTER — Encounter (HOSPITAL_COMMUNITY): Payer: Self-pay | Admitting: *Deleted

## 2011-10-12 DIAGNOSIS — O479 False labor, unspecified: Secondary | ICD-10-CM | POA: Insufficient documentation

## 2011-10-12 NOTE — MAU Note (Signed)
Pt reports having contractions since this morning. Denies  Bleeding or leaking and reports not aware of fetal movement because of contractions. Pt has gestational diabetes insulin controlled

## 2011-10-15 ENCOUNTER — Telehealth (HOSPITAL_COMMUNITY): Payer: Self-pay | Admitting: *Deleted

## 2011-10-15 ENCOUNTER — Ambulatory Visit (HOSPITAL_COMMUNITY)
Admission: RE | Admit: 2011-10-15 | Discharge: 2011-10-15 | Disposition: A | Discharge status: Home / Self Care | Payer: Medicaid Other | Source: Ambulatory Visit | Attending: Obstetrics | Admitting: Obstetrics

## 2011-10-15 ENCOUNTER — Encounter (HOSPITAL_COMMUNITY): Payer: Self-pay

## 2011-10-15 DIAGNOSIS — O99810 Abnormal glucose complicating pregnancy: Secondary | ICD-10-CM | POA: Insufficient documentation

## 2011-10-15 DIAGNOSIS — O99210 Obesity complicating pregnancy, unspecified trimester: Secondary | ICD-10-CM | POA: Insufficient documentation

## 2011-10-15 DIAGNOSIS — E669 Obesity, unspecified: Secondary | ICD-10-CM | POA: Insufficient documentation

## 2011-10-15 NOTE — Telephone Encounter (Signed)
Preadmission screen  

## 2011-10-18 ENCOUNTER — Ambulatory Visit (HOSPITAL_COMMUNITY): Payer: Medicaid Other

## 2011-10-20 ENCOUNTER — Inpatient Hospital Stay (HOSPITAL_COMMUNITY)
Admission: AD | Admit: 2011-10-20 | Discharge: 2011-10-23 | DRG: 775 | Disposition: A | Discharge status: Home / Self Care | Payer: Medicaid Other | Source: Ambulatory Visit | Attending: Obstetrics | Admitting: Obstetrics

## 2011-10-20 ENCOUNTER — Encounter (HOSPITAL_COMMUNITY): Payer: Self-pay | Admitting: *Deleted

## 2011-10-20 DIAGNOSIS — O99814 Abnormal glucose complicating childbirth: Principal | ICD-10-CM | POA: Diagnosis present

## 2011-10-20 DIAGNOSIS — IMO0001 Reserved for inherently not codable concepts without codable children: Secondary | ICD-10-CM

## 2011-10-20 DIAGNOSIS — B9789 Other viral agents as the cause of diseases classified elsewhere: Secondary | ICD-10-CM | POA: Diagnosis present

## 2011-10-20 DIAGNOSIS — O99892 Other specified diseases and conditions complicating childbirth: Secondary | ICD-10-CM | POA: Diagnosis present

## 2011-10-20 DIAGNOSIS — R509 Fever, unspecified: Secondary | ICD-10-CM

## 2011-10-20 DIAGNOSIS — O2340 Unspecified infection of urinary tract in pregnancy, unspecified trimester: Secondary | ICD-10-CM

## 2011-10-20 DIAGNOSIS — O210 Mild hyperemesis gravidarum: Secondary | ICD-10-CM | POA: Diagnosis present

## 2011-10-20 LAB — CBC WITH DIFFERENTIAL/PLATELET
Basophils Absolute: 0 10*3/uL (ref 0.0–0.1)
Basophils Relative: 0 % (ref 0–1)
Eosinophils Absolute: 0 10*3/uL (ref 0.0–0.7)
Eosinophils Relative: 0 % (ref 0–5)
HCT: 35.2 % — ABNORMAL LOW (ref 36.0–46.0)
Hemoglobin: 11.9 g/dL — ABNORMAL LOW (ref 12.0–15.0)
Lymphocytes Relative: 19 % (ref 12–46)
Lymphs Abs: 2.5 10*3/uL (ref 0.7–4.0)
MCH: 29.9 pg (ref 26.0–34.0)
MCHC: 33.8 g/dL (ref 30.0–36.0)
MCV: 88.4 fL (ref 78.0–100.0)
Monocytes Absolute: 1.6 10*3/uL — ABNORMAL HIGH (ref 0.1–1.0)
Monocytes Relative: 13 % — ABNORMAL HIGH (ref 3–12)
Neutro Abs: 8.8 10*3/uL — ABNORMAL HIGH (ref 1.7–7.7)
Neutrophils Relative %: 68 % (ref 43–77)
Platelets: 189 10*3/uL (ref 150–400)
RBC: 3.98 MIL/uL (ref 3.87–5.11)
RDW: 15.3 % (ref 11.5–15.5)
WBC: 12.8 10*3/uL — ABNORMAL HIGH (ref 4.0–10.5)

## 2011-10-20 LAB — URINALYSIS, ROUTINE W REFLEX MICROSCOPIC
Bilirubin Urine: NEGATIVE
Glucose, UA: NEGATIVE mg/dL
Protein, ur: NEGATIVE mg/dL
Urobilinogen, UA: 1 mg/dL (ref 0.0–1.0)

## 2011-10-20 LAB — COMPREHENSIVE METABOLIC PANEL
ALT: 10 U/L (ref 0–35)
AST: 14 U/L (ref 0–37)
Albumin: 2.8 g/dL — ABNORMAL LOW (ref 3.5–5.2)
Alkaline Phosphatase: 220 U/L — ABNORMAL HIGH (ref 39–117)
BUN: 4 mg/dL — ABNORMAL LOW (ref 6–23)
CO2: 19 mEq/L (ref 19–32)
Calcium: 9.4 mg/dL (ref 8.4–10.5)
Chloride: 98 mEq/L (ref 96–112)
Creatinine, Ser: 0.69 mg/dL (ref 0.50–1.10)
GFR calc Af Amer: >90 mL/min (ref >90)
GFR calc non Af Amer: >90 mL/min (ref >90)
Glucose, Bld: 82 mg/dL (ref 70–99)
Potassium: 3.9 mEq/L (ref 3.5–5.1)
Sodium: 130 mEq/L — ABNORMAL LOW (ref 135–145)
Total Bilirubin: 0.7 mg/dL (ref 0.3–1.2)
Total Protein: 6.3 g/dL (ref 6.0–8.3)

## 2011-10-20 LAB — CBC
HCT: 39.2 % (ref 36.0–46.0)
Hemoglobin: 12.9 g/dL (ref 12.0–15.0)
MCH: 29.5 pg (ref 26.0–34.0)
MCHC: 32.9 g/dL (ref 30.0–36.0)
MCV: 89.7 fL (ref 78.0–100.0)
RDW: 15.3 % (ref 11.5–15.5)

## 2011-10-20 LAB — URINE MICROSCOPIC-ADD ON

## 2011-10-20 LAB — GLUCOSE, CAPILLARY: Glucose-Capillary: 71 mg/dL (ref 70–99)

## 2011-10-20 LAB — LACTATE DEHYDROGENASE: LDH: 190 U/L (ref 94–250)

## 2011-10-20 LAB — URIC ACID: Uric Acid, Serum: 6.8 mg/dL (ref 2.4–7.0)

## 2011-10-20 MED ORDER — INSULIN LISPRO 100 UNIT/ML ~~LOC~~ SOLN: 10.0000 [IU] | Freq: Three times a day (TID) | SUBCUTANEOUS | Status: DC

## 2011-10-20 MED ORDER — LACTATED RINGERS IV SOLN
INTRAVENOUS | Status: DC
  Administered 2011-10-20 – 2011-10-21 (×5): via INTRAVENOUS

## 2011-10-20 MED ORDER — LIDOCAINE HCL (PF) 1 % IJ SOLN
30.0000 mL | INTRAMUSCULAR | Status: DC | PRN
  Filled 2011-10-20 (×2): qty 30

## 2011-10-20 MED ORDER — OSELTAMIVIR PHOSPHATE 75 MG PO CAPS
75.0000 mg | ORAL_CAPSULE | Freq: Two times a day (BID) | ORAL | Status: DC
  Administered 2011-10-20 – 2011-10-21 (×2): 75 mg via ORAL
  Filled 2011-10-20 (×5): qty 1

## 2011-10-20 MED ORDER — IBUPROFEN 600 MG PO TABS
600.0000 mg | ORAL_TABLET | Freq: Four times a day (QID) | ORAL | Status: DC | PRN
  Filled 2011-10-20 (×3): qty 1

## 2011-10-20 MED ORDER — OXYTOCIN 40 UNITS IN LACTATED RINGERS INFUSION - SIMPLE MED: 62.5000 mL/h | Freq: Once | INTRAVENOUS | Status: DC

## 2011-10-20 MED ORDER — PRENATAL MULTIVITAMIN CH: 1.0000 | ORAL_TABLET | Freq: Every day | ORAL | Status: DC

## 2011-10-20 MED ORDER — CITRIC ACID-SODIUM CITRATE 334-500 MG/5ML PO SOLN: 30.0000 mL | ORAL | Status: DC | PRN

## 2011-10-20 MED ORDER — INSULIN NPH (HUMAN) (ISOPHANE) 100 UNIT/ML ~~LOC~~ SUSP
16.0000 [IU] | Freq: Every day | SUBCUTANEOUS | Status: DC
  Filled 2011-10-20: qty 10

## 2011-10-20 MED ORDER — ACETAMINOPHEN 500 MG PO TABS
1000.0000 mg | ORAL_TABLET | Freq: Once | ORAL | Status: AC
  Administered 2011-10-20: 1000 mg via ORAL
  Filled 2011-10-20: qty 2

## 2011-10-20 MED ORDER — FLEET ENEMA 7-19 GM/118ML RE ENEM: 1.0000 | ENEMA | Freq: Every day | RECTAL | Status: DC | PRN

## 2011-10-20 MED ORDER — ACETAMINOPHEN 325 MG PO TABS: 650.0000 mg | ORAL_TABLET | ORAL | Status: DC | PRN

## 2011-10-20 MED ORDER — OXYTOCIN BOLUS FROM INFUSION
500.0000 mL | Freq: Once | INTRAVENOUS | Status: AC
  Administered 2011-10-21: 500 mL via INTRAVENOUS
  Filled 2011-10-20: qty 500

## 2011-10-20 MED ORDER — DEXTROSE 5 % IV SOLN
2.0000 g | Freq: Four times a day (QID) | INTRAVENOUS | Status: DC
  Administered 2011-10-20 – 2011-10-21 (×3): 2 g via INTRAVENOUS
  Filled 2011-10-20 (×5): qty 2

## 2011-10-20 MED ORDER — LACTATED RINGERS IV SOLN: 500.0000 mL | INTRAVENOUS | Status: DC | PRN

## 2011-10-20 MED ORDER — OXYCODONE-ACETAMINOPHEN 5-325 MG PO TABS
1.0000 | ORAL_TABLET | ORAL | Status: DC | PRN
  Administered 2011-10-21: 1 via ORAL
  Filled 2011-10-20: qty 1

## 2011-10-20 MED ORDER — ONDANSETRON HCL 4 MG/2ML IJ SOLN
4.0000 mg | Freq: Four times a day (QID) | INTRAMUSCULAR | Status: DC | PRN
  Administered 2011-10-20: 4 mg via INTRAVENOUS
  Filled 2011-10-20: qty 2

## 2011-10-20 NOTE — MAU Provider Note (Signed)
History     CSN: [id number]  Arrival date & time 10/20/11  1806   None     Chief Complaint  Patient presents with  . Headache  . Fever  . Generalized Body Aches    (Consider location/radiation/quality/duration/timing/severity/associated sxs/prior treatment) HPIAngel C Spence is a 31 y.o. G2P1001 at [redacted]w[redacted]d.  She presents with c/o not feeling well. She woke up yesterday am with fever, headache, body aches, decreased appetite. She has a  non productive cough and stuffy nose.  No bleeding or leaking, good fetal movement, occ cramping.   Past Medical History  Diagnosis Date  . Pregnancy induced hypertension   . Bilateral ovarian cysts   . Headache   . Gestational diabetes     History reviewed. No pertinent past surgical history.  Family History  Problem Relation Age of Onset  . Anesthesia problems Neg Hx   . Hypotension Neg Hx   . Malignant hyperthermia Neg Hx   . Pseudochol deficiency Neg Hx   . Diabetes Mother   . Hypertension Mother   . Diabetes Maternal Grandmother   . Hyperlipidemia Maternal Grandmother     History  Substance Use Topics  . Smoking status: Former Smoker    Types: Cigarettes  . Smokeless tobacco: Never Used  . Alcohol Use: No     socially-not with pregnancy    OB History    Grav Para Term Preterm Abortions TAB SAB Ect Mult Living   2 1 1  0      1      Review of Systems  Constitutional: Positive for fever, chills and fatigue.  HENT: Positive for congestion.   Respiratory: Positive for cough. Negative for shortness of breath.   Cardiovascular: Negative for chest pain.  Gastrointestinal: Negative.   Genitourinary: Negative.     Allergies  Orange  Home Medications  No current outpatient prescriptions on file.  BP 138/97  Pulse 114  Temp 99.6 F (37.6 C)  Resp 18  SpO2 98%  LMP 01/29/2011  Physical Exam  Constitutional: She is oriented to person, place, and time. She appears well-developed and well-nourished.  Cardiovascular:  Normal rate, regular rhythm and normal heart sounds.   Pulmonary/Chest: She has wheezes.  Musculoskeletal: Normal range of motion.  Neurological: She is alert and oriented to person, place, and time.  Skin: Skin is warm and dry.  Psychiatric: She has a normal mood and affect.    ED Course  Procedures  No results found. 7:30pm consulted with DrHarper, to start Tamiflu, will call him back with B/P's and labor assessment. RN to call him back. Med screen exam complete, pt stable for Dr Clearance Coots to continue plan of care. No diagnosis found.  Results for orders placed during the hospital encounter of 10/20/11 (from the past 24 hour(s))  URINALYSIS, ROUTINE W REFLEX MICROSCOPIC     Status: Abnormal   Collection Time   10/20/11  6:28 PM      Component Value Range   Color, Urine YELLOW  YELLOW   APPearance CLEAR  CLEAR   Specific Gravity, Urine 1.015  1.005 - 1.030   pH 6.5  5.0 - 8.0   Glucose, UA NEGATIVE  NEGATIVE mg/dL   Hgb urine dipstick TRACE (*) NEGATIVE   Bilirubin Urine NEGATIVE  NEGATIVE   Ketones, ur NEGATIVE  NEGATIVE mg/dL   Protein, ur NEGATIVE  NEGATIVE mg/dL   Urobilinogen, UA 1.0  0.0 - 1.0 mg/dL   Nitrite NEGATIVE  NEGATIVE   Leukocytes, UA SMALL (*)  NEGATIVE  URINE MICROSCOPIC-ADD ON     Status: Abnormal   Collection Time   10/20/11  6:28 PM      Component Value Range   Squamous Epithelial / LPF MANY (*) RARE   WBC, UA 21-50  <3 WBC/hpf   RBC / HPF 0-2  <3 RBC/hpf   Bacteria, UA RARE  RARE   Urine-Other MUCOUS PRESENT    GLUCOSE, CAPILLARY     Status: Normal   Collection Time   10/20/11  7:28 PM      Component Value Range   Glucose-Capillary 71  70 - 99 mg/dL  CBC WITH DIFFERENTIAL     Status: Abnormal   Collection Time   10/20/11  7:55 PM      Component Value Range   WBC 12.8 (*) 4.0 - 10.5 K/uL   RBC 3.98  3.87 - 5.11 MIL/uL   Hemoglobin 11.9 (*) 12.0 - 15.0 g/dL   HCT 16.1 (*) 09.6 - 04.5 %   MCV 88.4  78.0 - 100.0 fL   MCH 29.9  26.0 - 34.0 pg   MCHC  33.8  30.0 - 36.0 g/dL   RDW 40.9  81.1 - 91.4 %   Platelets 189  150 - 400 K/uL   Neutrophils Relative 68  43 - 77 %   Neutro Abs 8.8 (*) 1.7 - 7.7 K/uL   Lymphocytes Relative 19  12 - 46 %   Lymphs Abs 2.5  0.7 - 4.0 K/uL   Monocytes Relative 13 (*) 3 - 12 %   Monocytes Absolute 1.6 (*) 0.1 - 1.0 K/uL   Eosinophils Relative 0  0 - 5 %   Eosinophils Absolute 0.0  0.0 - 0.7 K/uL   Basophils Relative 0  0 - 1 %   Basophils Absolute 0.0  0.0 - 0.1 K/uL  COMPREHENSIVE METABOLIC PANEL     Status: Abnormal   Collection Time   10/20/11  7:55 PM      Component Value Range   Sodium 130 (*) 135 - 145 mEq/L   Potassium 3.9  3.5 - 5.1 mEq/L   Chloride 98  96 - 112 mEq/L   CO2 19  19 - 32 mEq/L   Glucose, Bld 82  70 - 99 mg/dL   BUN 4 (*) 6 - 23 mg/dL   Creatinine, Ser 7.82  0.50 - 1.10 mg/dL   Calcium 9.4  8.4 - 95.6 mg/dL   Total Protein 6.3  6.0 - 8.3 g/dL   Albumin 2.8 (*) 3.5 - 5.2 g/dL   AST 14  0 - 37 U/L   ALT 10  0 - 35 U/L   Alkaline Phosphatase 220 (*) 39 - 117 U/L   Total Bilirubin 0.7  0.3 - 1.2 mg/dL   GFR calc non Af Amer >90  >90 mL/min   GFR calc Af Amer >90  >90 mL/min  LACTATE DEHYDROGENASE     Status: Normal   Collection Time   10/20/11  7:55 PM      Component Value Range   LDH 190  94 - 250 U/L  URIC ACID     Status: Normal   Collection Time   10/20/11  7:55 PM      Component Value Range   Uric Acid, Serum 6.8  2.4 - 7.0 mg/dL     MDM

## 2011-10-20 NOTE — H&P (Signed)
Rebecca Spence is a 31 y.o. female presenting for N/V, diarrhea,fever and UC's. Maternal Medical History:  Reason for admission: Reason for admission: contractions.  31 yo G2 P1   EDC 11-05-11.  Presents with N/V, diarrhea and fever.  Contractions: Onset was 3-5 hours ago.   Frequency: irregular.    Fetal activity: Perceived fetal activity is normal.   Last perceived fetal movement was within the past hour.    Prenatal Complications - Diabetes: gestational.    OB History    Grav Para Term Preterm Abortions TAB SAB Ect Mult Living   2 1 1  0      1     Past Medical History  Diagnosis Date  . Pregnancy induced hypertension   . Bilateral ovarian cysts   . Headache   . Gestational diabetes    History reviewed. No pertinent past surgical history. Family History: family history includes Diabetes in her maternal grandmother and mother; Hyperlipidemia in her maternal grandmother; and Hypertension in her mother.  There is no history of Anesthesia problems, and Hypotension, and Malignant hyperthermia, and Pseudochol deficiency, . Social History:  reports that she has quit smoking. Her smoking use included Cigarettes. She has never used smokeless tobacco. She reports that she uses illicit drugs (Marijuana). She reports that she does not drink alcohol.   Prenatal Transfer Tool  Maternal Diabetes: Yes:  Diabetes Type:  Diet controlled Genetic Screening: Normal Maternal Ultrasounds/Referrals: Normal Fetal Ultrasounds or other Referrals:  None Maternal Substance Abuse:  No Significant Maternal Medications:  Meds include: Other:  Significant Maternal Lab Results:  Lab values include: Group B Strep negative  Other Comments:  None  ROS  Dilation: 3 Effacement (%): 80 Station: -2 Exam by:: Ginger Morris RN Blood pressure 136/115, pulse 108, temperature 101.4 F (38.6 C), resp. rate 18, last menstrual period 01/29/2011, SpO2 98.00%. Exam Physical Exam  Prenatal labs: ABO, Rh: A/Positive/-- (03/04 0000) Antibody: Negative (03/04 0000) Rubella: Immune (03/04 0000) RPR: Nonreactive (03/04 0000)  HBsAg: Negative (03/04 0000)  HIV: Non-reactive (03/04 0000)  GBS:     Assessment/Plan: 37.5 weeks.  Viral syndrome.  Admit.  Expectant management.   HARPER,CHARLES A 10/20/2011, 11:46 PM

## 2011-10-20 NOTE — MAU Note (Signed)
Patient states she started having flu like symptoms yesterday and getting worse. Had a fever today of 101. No appetite and has not eaten since last night. Is on Insulin but has not taken today because of not eating. Reports mild contractions, no bleeding or leaking and feeling the baby move.

## 2011-10-21 ENCOUNTER — Encounter (HOSPITAL_COMMUNITY): Payer: Self-pay | Admitting: *Deleted

## 2011-10-21 ENCOUNTER — Inpatient Hospital Stay (HOSPITAL_COMMUNITY): Payer: Medicaid Other | Admitting: Anesthesiology

## 2011-10-21 ENCOUNTER — Encounter (HOSPITAL_COMMUNITY): Payer: Self-pay | Admitting: Anesthesiology

## 2011-10-21 LAB — CBC
HCT: 33.8 % — ABNORMAL LOW (ref 36.0–46.0)
HCT: 35.1 % — ABNORMAL LOW (ref 36.0–46.0)
MCHC: 33.1 g/dL (ref 30.0–36.0)
MCV: 89.4 fL (ref 78.0–100.0)
MCV: 89.8 fL (ref 78.0–100.0)
RBC: 3.91 MIL/uL (ref 3.87–5.11)
RDW: 15.4 % (ref 11.5–15.5)
WBC: 16.2 10*3/uL — ABNORMAL HIGH (ref 4.0–10.5)

## 2011-10-21 LAB — TYPE AND SCREEN: ABO/RH(D): A POS

## 2011-10-21 MED ORDER — LACTATED RINGERS IV SOLN: 500.0000 mL | Freq: Once | INTRAVENOUS | Status: DC

## 2011-10-21 MED ORDER — SODIUM BICARBONATE 8.4 % IV SOLN
INTRAVENOUS | Status: DC | PRN
  Administered 2011-10-21: 4 mL via EPIDURAL

## 2011-10-21 MED ORDER — INSULIN ASPART 100 UNIT/ML ~~LOC~~ SOLN: 12.0000 [IU] | Freq: Every day | SUBCUTANEOUS | Status: DC

## 2011-10-21 MED ORDER — IBUPROFEN 600 MG PO TABS
600.0000 mg | ORAL_TABLET | Freq: Four times a day (QID) | ORAL | Status: DC
  Administered 2011-10-21 – 2011-10-23 (×7): 600 mg via ORAL
  Filled 2011-10-21 (×4): qty 1

## 2011-10-21 MED ORDER — SIMETHICONE 80 MG PO CHEW: 80.0000 mg | CHEWABLE_TABLET | ORAL | Status: DC | PRN

## 2011-10-21 MED ORDER — TETANUS-DIPHTH-ACELL PERTUSSIS 5-2.5-18.5 LF-MCG/0.5 IM SUSP
0.5000 mL | Freq: Once | INTRAMUSCULAR | Status: AC
  Administered 2011-10-23: 0.5 mL via INTRAMUSCULAR
  Filled 2011-10-21 (×2): qty 0.5

## 2011-10-21 MED ORDER — INSULIN NPH (HUMAN) (ISOPHANE) 100 UNIT/ML ~~LOC~~ SUSP
36.0000 [IU] | Freq: Every day | SUBCUTANEOUS | Status: DC
  Filled 2011-10-21: qty 10

## 2011-10-21 MED ORDER — PRENATAL MULTIVITAMIN CH
1.0000 | ORAL_TABLET | Freq: Every day | ORAL | Status: DC
  Administered 2011-10-21 – 2011-10-23 (×3): 1 via ORAL
  Filled 2011-10-21 (×3): qty 1

## 2011-10-21 MED ORDER — BENZOCAINE-MENTHOL 20-0.5 % EX AERO
1.0000 "application " | INHALATION_SPRAY | CUTANEOUS | Status: DC | PRN
  Administered 2011-10-22: 1 via TOPICAL
  Filled 2011-10-21: qty 56

## 2011-10-21 MED ORDER — WITCH HAZEL-GLYCERIN EX PADS: 1.0000 "application " | MEDICATED_PAD | CUTANEOUS | Status: DC | PRN

## 2011-10-21 MED ORDER — MAGNESIUM SULFATE 40 G IN LACTATED RINGERS - SIMPLE
2.0000 g/h | INTRAVENOUS | Status: DC
  Administered 2011-10-21 – 2011-10-22 (×2): 2 g/h via INTRAVENOUS
  Filled 2011-10-21 (×2): qty 500

## 2011-10-21 MED ORDER — ONDANSETRON HCL 4 MG/2ML IJ SOLN: 4.0000 mg | INTRAMUSCULAR | Status: DC | PRN

## 2011-10-21 MED ORDER — FENTANYL 2.5 MCG/ML BUPIVACAINE 1/10 % EPIDURAL INFUSION (WH - ANES)
14.0000 mL/h | INTRAMUSCULAR | Status: DC
  Administered 2011-10-21: 14 mL/h via EPIDURAL
  Filled 2011-10-21 (×2): qty 60

## 2011-10-21 MED ORDER — MAGNESIUM SULFATE BOLUS VIA INFUSION
4.0000 g | Freq: Once | INTRAVENOUS | Status: AC
  Administered 2011-10-21: 4 g via INTRAVENOUS
  Filled 2011-10-21: qty 500

## 2011-10-21 MED ORDER — LACTATED RINGERS IV SOLN
INTRAVENOUS | Status: DC
  Administered 2011-10-21 – 2011-10-22 (×2): via INTRAVENOUS

## 2011-10-21 MED ORDER — FENTANYL 2.5 MCG/ML BUPIVACAINE 1/10 % EPIDURAL INFUSION (WH - ANES)
INTRAMUSCULAR | Status: DC | PRN
  Administered 2011-10-21: 14 mL/h via EPIDURAL

## 2011-10-21 MED ORDER — ZOLPIDEM TARTRATE 5 MG PO TABS: 5.0000 mg | ORAL_TABLET | Freq: Every evening | ORAL | Status: DC | PRN

## 2011-10-21 MED ORDER — DIBUCAINE 1 % RE OINT: 1.0000 "application " | TOPICAL_OINTMENT | RECTAL | Status: DC | PRN

## 2011-10-21 MED ORDER — PROMETHAZINE HCL 25 MG/ML IJ SOLN
25.0000 mg | Freq: Four times a day (QID) | INTRAMUSCULAR | Status: DC | PRN
  Administered 2011-10-21: 25 mg via INTRAMUSCULAR
  Filled 2011-10-21: qty 1

## 2011-10-21 MED ORDER — OSELTAMIVIR PHOSPHATE 75 MG PO CAPS
75.0000 mg | ORAL_CAPSULE | Freq: Two times a day (BID) | ORAL | Status: DC
  Administered 2011-10-21 – 2011-10-23 (×4): 75 mg via ORAL
  Filled 2011-10-21 (×6): qty 1

## 2011-10-21 MED ORDER — EPHEDRINE 5 MG/ML INJ: 10.0000 mg | INTRAVENOUS | Status: DC | PRN

## 2011-10-21 MED ORDER — ONDANSETRON HCL 4 MG PO TABS: 4.0000 mg | ORAL_TABLET | ORAL | Status: DC | PRN

## 2011-10-21 MED ORDER — PHENYLEPHRINE 40 MCG/ML (10ML) SYRINGE FOR IV PUSH (FOR BLOOD PRESSURE SUPPORT): 80.0000 ug | PREFILLED_SYRINGE | INTRAVENOUS | Status: DC | PRN

## 2011-10-21 MED ORDER — DIPHENHYDRAMINE HCL 50 MG/ML IJ SOLN: 12.5000 mg | INTRAMUSCULAR | Status: DC | PRN

## 2011-10-21 MED ORDER — INSULIN ASPART 100 UNIT/ML ~~LOC~~ SOLN: 10.0000 [IU] | Freq: Two times a day (BID) | SUBCUTANEOUS | Status: DC

## 2011-10-21 MED ORDER — LANOLIN HYDROUS EX OINT: TOPICAL_OINTMENT | CUTANEOUS | Status: DC | PRN

## 2011-10-21 MED ORDER — OXYTOCIN 40 UNITS IN LACTATED RINGERS INFUSION - SIMPLE MED
1.0000 m[IU]/min | INTRAVENOUS | Status: DC
  Administered 2011-10-21: 2 m[IU]/min via INTRAVENOUS
  Filled 2011-10-21: qty 1000

## 2011-10-21 MED ORDER — PHENYLEPHRINE 40 MCG/ML (10ML) SYRINGE FOR IV PUSH (FOR BLOOD PRESSURE SUPPORT)
80.0000 ug | PREFILLED_SYRINGE | INTRAVENOUS | Status: DC | PRN
  Filled 2011-10-21: qty 5

## 2011-10-21 MED ORDER — DIPHENHYDRAMINE HCL 25 MG PO CAPS: 25.0000 mg | ORAL_CAPSULE | Freq: Four times a day (QID) | ORAL | Status: DC | PRN

## 2011-10-21 MED ORDER — FERROUS SULFATE 325 (65 FE) MG PO TABS
325.0000 mg | ORAL_TABLET | Freq: Two times a day (BID) | ORAL | Status: DC
  Administered 2011-10-21 – 2011-10-23 (×4): 325 mg via ORAL
  Filled 2011-10-21 (×4): qty 1

## 2011-10-21 MED ORDER — NALBUPHINE SYRINGE 5 MG/0.5 ML
10.0000 mg | INJECTION | Freq: Four times a day (QID) | INTRAMUSCULAR | Status: DC | PRN
  Administered 2011-10-21: 10 mg via INTRAMUSCULAR
  Filled 2011-10-21 (×2): qty 0.5
  Filled 2011-10-21: qty 1

## 2011-10-21 MED ORDER — EPHEDRINE 5 MG/ML INJ
10.0000 mg | INTRAVENOUS | Status: DC | PRN
  Filled 2011-10-21: qty 4

## 2011-10-21 MED ORDER — NALBUPHINE SYRINGE 5 MG/0.5 ML
10.0000 mg | INJECTION | INTRAMUSCULAR | Status: DC | PRN
  Administered 2011-10-21: 10 mg via INTRAVENOUS
  Filled 2011-10-21: qty 0.5
  Filled 2011-10-21: qty 1
  Filled 2011-10-21: qty 0.5

## 2011-10-21 MED ORDER — OXYCODONE-ACETAMINOPHEN 5-325 MG PO TABS
1.0000 | ORAL_TABLET | ORAL | Status: DC | PRN
  Administered 2011-10-22: 1 via ORAL
  Filled 2011-10-21: qty 1

## 2011-10-21 MED ORDER — INFLUENZA VIRUS VACC SPLIT PF IM SUSP
0.5000 mL | INTRAMUSCULAR | Status: AC
Start: 2011-10-22 — End: 2011-10-23
  Filled 2011-10-21: qty 0.5

## 2011-10-21 MED ORDER — SENNOSIDES-DOCUSATE SODIUM 8.6-50 MG PO TABS
2.0000 | ORAL_TABLET | Freq: Every day | ORAL | Status: DC
  Administered 2011-10-21 – 2011-10-22 (×2): 2 via ORAL

## 2011-10-21 MED ORDER — TERBUTALINE SULFATE 1 MG/ML IJ SOLN: 0.2500 mg | Freq: Once | INTRAMUSCULAR | Status: DC | PRN

## 2011-10-21 NOTE — Anesthesia Preprocedure Evaluation (Signed)
Anesthesia Evaluation    Airway       Dental   Pulmonary          Cardiovascular hypertension (on Mg+2, repeat labs okay),     Neuro/Psych    GI/Hepatic   Endo/Other  diabetes, GestationalMorbid obesity  Renal/GU      Musculoskeletal   Abdominal   Peds  Hematology   Anesthesia Other Findings   Reproductive/Obstetrics                           Anesthesia Physical Anesthesia Plan  ASA: III  Anesthesia Plan: Epidural   Post-op Pain Management:    Induction:   Airway Management Planned:   Additional Equipment:   Intra-op Plan:   Post-operative Plan:   Informed Consent: I have reviewed the patients History and Physical, chart, labs and discussed the procedure including the risks, benefits and alternatives for the proposed anesthesia with the patient or authorized representative who has indicated his/her understanding and acceptance.   Dental Advisory Given  Plan Discussed with:   Anesthesia Plan Comments: (Labs checked- platelets confirmed with RN in room. Fetal heart tracing, per RN, reported to be stable enough for sitting procedure. Discussed epidural, and patient consents to the procedure:  included risk of possible headache,backache, failed block, allergic reaction, and nerve injury. This patient was asked if she had any questions or concerns before the procedure started. )        Anesthesia Quick Evaluation

## 2011-10-21 NOTE — Progress Notes (Signed)
Patient ID: Rebecca Spence, female   DOB: 03-26-1980, 31 y.o.   MRN: [id number] Patient is a gravida 2 para 1001 at 37+ weeks due 10:15 was admitted him with flulike symptoms she's now on cefoxitin 2 g IV every 6 hours cervix 4 cm 85% vertex -1-2 amniotomy was performed the fluid was clear and she is contracting on her own every 2 minutes she has been followed in this pregnancy by MFM for diabetes insulin controlled and on since admission her blood sugars of and 95 and 78 the the followed up with 4 hours

## 2011-10-21 NOTE — Anesthesia Procedure Notes (Signed)
Epidural Patient location during procedure: OB  Preanesthetic Checklist Completed: patient identified, site marked, surgical consent, pre-op evaluation, timeout performed, IV checked, risks and benefits discussed and monitors and equipment checked  Epidural Patient position: sitting Prep: site prepped and draped and DuraPrep Patient monitoring: continuous pulse ox and blood pressure Approach: midline Injection technique: LOR air  Needle:  Needle type: Tuohy  Needle gauge: 17 G Needle length: 9 cm and 9 Needle insertion depth: 6 cm Catheter type: closed end flexible Catheter size: 19 Gauge Catheter at skin depth: 12 cm Test dose: negative  Assessment Events: blood not aspirated, injection not painful, no injection resistance, negative IV test and no paresthesia  Additional Notes Dosing of Epidural:  1st dose, through needle ............................................. epi 1:200K + Xylocaine 40 mg  2nd dose, through catheter, after waiting 3 minutes.....epi 1:200K + Xylocaine 40 mg  3rd dose, through catheter after waiting 3 minutes .............................Marcaine   4mg   ( mg Marcaine are expressed as equivilent  cc's medication removed from the 0.1%Bupiv / fentanyl syringe from L&D pump)  ( 2% Xylo charted as a single dose in Epic Meds for ease of charting; actual dosing was fractionated as above, for saftey's sake)  As each dose occurred, patient was free of IV sx; and patient exhibited no evidence of SA injection.  Patient is more comfortable after epidural dosed. Please see RN's note for documentation of vital signs,and FHR which are stable.  Patient reminded not to try to ambulate with numb legs, and that an RN must be present the 1st time she attempts to get up.    

## 2011-10-21 NOTE — Progress Notes (Signed)
Patient ID: Rebecca Spence, female   DOB: 1980/09/09, 31 y.o.   MRN: [id number] This a.m. Her blood pressures are elevated and she was started on magnesium sulfate 4 g loading and 2 g now she has her epidural and her cervix is now 8-9 cm 100% with the vertex at a 0 station and tracing is reactive

## 2011-10-22 ENCOUNTER — Ambulatory Visit (HOSPITAL_COMMUNITY): Payer: Medicaid Other

## 2011-10-22 LAB — CBC
HCT: 27.9 % — ABNORMAL LOW (ref 36.0–46.0)
Hemoglobin: 9.3 g/dL — ABNORMAL LOW (ref 12.0–15.0)
MCH: 29.8 pg (ref 26.0–34.0)
MCHC: 33.3 g/dL (ref 30.0–36.0)
MCV: 89.4 fL (ref 78.0–100.0)

## 2011-10-22 MED ORDER — CIPROFLOXACIN HCL 500 MG PO TABS
500.0000 mg | ORAL_TABLET | Freq: Two times a day (BID) | ORAL | Status: DC
  Administered 2011-10-22 – 2011-10-23 (×2): 500 mg via ORAL
  Filled 2011-10-22 (×3): qty 1

## 2011-10-22 NOTE — Progress Notes (Signed)
Verbal report to Victorino Dike, RN on [facility] re pt transfer to room 123.

## 2011-10-22 NOTE — Progress Notes (Signed)
Patient ID: Rebecca Spence, female   DOB: Apr 01, 1980, 31 y.o.   MRN: [id number] Postpartum day one Diastolic blood pressures 80-90 will continue the magnesium sulfate for 24 hours Her flulike symptoms a much better She has no fever Fundus firm Lochia moderate doing well and her sugars have all been normal

## 2011-10-22 NOTE — Anesthesia Postprocedure Evaluation (Signed)
  Anesthesia Post-op Note  Patient: Rebecca Spence  Procedure(s) Performed: * No procedures listed *  Patient Location: A-ICU  Anesthesia Type: Epidural  Level of Consciousness: awake and alert   Airway and Oxygen Therapy: Patient Spontanous Breathing  Post-op Pain: none  Post-op Assessment: Patient's Cardiovascular Status Stable, Respiratory Function Stable, Patent Airway, No signs of Nausea or vomiting, Adequate PO intake, Pain level controlled, No headache, No backache, No residual numbness and No residual motor weakness  Post-op Vital Signs: Reviewed and stable  Complications: No apparent anesthesia complications

## 2011-10-22 NOTE — Progress Notes (Signed)
I did not give the Tdap vaccine or the Flu vaccine because pt. Is on droplet precautions for flu-like symptoms, on tamiflu and has had recent fever within the past 24 hours.

## 2011-10-22 NOTE — Progress Notes (Signed)
UR chart review completed.  

## 2011-10-23 LAB — URINE CULTURE
Colony Count: >=100000
Special Requests: NORMAL

## 2011-10-23 NOTE — Discharge Summary (Signed)
Obstetric Discharge Summary Reason for Admission: onset of labor Prenatal Procedures: none Intrapartum Procedures: spontaneous vaginal delivery Postpartum Procedures: none Complications-Operative and Postpartum: none Hemoglobin  Date Value Range Status  10/22/2011 9.3* 12.0 - 15.0 g/dL Final     REPEATED TO VERIFY     DELTA CHECK NOTED     HCT  Date Value Range Status  10/22/2011 27.9* 36.0 - 46.0 % Final    Physical Exam:  General: alert Lochia: appropriate Uterine Fundus: firm Incision: healing well DVT Evaluation: No evidence of DVT seen on physical exam.  Discharge Diagnoses: Term Pregnancy-delivered  Discharge Information: Date: 10/23/2011 Activity: pelvic rest Diet: routine Medications: Percocet Condition: stable Instructions: refer to practice specific booklet Discharge to: home Follow-up Information    Call in 1 week to follow up. (bp check)    Contact information:   b [address]ret Torre         Newborn Data: Live born female  Birth Weight: 7 lb 15 oz (3600 g) APGAR: 9, 9  Home with mother.  Rebecca Spence A 10/23/2011, 6:23 AM

## 2011-10-23 NOTE — Progress Notes (Signed)
Patient ID: Rebecca Spence, female   DOB: 1980/04/21, 31 y.o.   MRN: [id number] Postpartum day 2 Blood pressure range 116/81-145/85 she will be started on labetalol 200 mg by mouth twice a day and to see me in one week for blood pressure check her flu symptoms have disappeared and she has no complaints fundus firm lochia moderate legs negative and doing well she'll be discharged today on Percocet and labetalol

## 2011-10-23 NOTE — Clinical Social Work Maternal (Signed)
    Clinical Social Work Department PSYCHOSOCIAL ASSESSMENT - MATERNAL/CHILD 10/23/2011  Patient:  Rebecca Spence, Rebecca Spence  Account Number:  [id number]  Admit Date:  10/20/2011  Marjo Bicker Name:   Rebecca Spence    Clinical Social Worker:  Andy Gauss   Date/Time:  10/23/2011 03:23 PM  Date Referred:  10/23/2011   Referral source  CN     Referred reason  Substance Abuse   Other referral source:    I:  FAMILY / HOME ENVIRONMENT Child's legal guardian:  PARENT  Guardian - Name Guardian - Age Guardian - Address  Katryn Plummer [address] [place], [place] [address]  Stefano Gaul 31    Other household support members/support persons Name Relationship DOB  Norm Parcel [facility] 12/06/04   Other support:   Mom and sister    II  PSYCHOSOCIAL DATA Information Source:  Patient Interview  Event organiser Employment:   Surveyor, quantity resources:  [employer] If Medicaid - County:  [place] Other  Sales executive  WIC   School / Grade:   Maternity Care Coordinator / Child Services Coordination / Early Interventions:  Cultural issues impacting care:    III  STRENGTHS Strengths  Adequate Resources  Home prepared for Child (including basic supplies)  Supportive family/friends   Strength comment:    IV  RISK FACTORS AND CURRENT PROBLEMS Current Problem:  YES   Risk Factor & Current Problem Patient Issue Family Issue Risk Factor / Current Problem Comment  Substance Abuse Y N Hx of MJ use    V  SOCIAL WORK ASSESSMENT Sw referral received to assess pt's history of MJ use.  Pt admits to smoking MJ, "every other day," prior to pregnancy confirmation at 6 weeks.  Once pregnancy was confirmed, pt decreased use to "once or twice a week," until 4 months. She denies any other illegal substance use. Sw explained hospital drug testing policy and pt verbalized understanding.  UDS is negative, meconium results are pending.  She has all the necessary supplies for the infant and supportive  family members.  Pt appears to be bonding well with the infant and appropriate.  Sw will follow up and assist further if needed.      VI SOCIAL WORK PLAN Social Work Plan  No Further Intervention Required / No Barriers to Discharge   Type of pt/family education:   If child protective services report - county:   If child protective services report - date:   Information/referral to community resources comment:   Other social work plan:

## 2011-10-24 ENCOUNTER — Inpatient Hospital Stay (HOSPITAL_COMMUNITY): Admission: RE | Admit: 2011-10-24 | Payer: Medicaid Other | Source: Ambulatory Visit

## 2011-10-25 ENCOUNTER — Other Ambulatory Visit (HOSPITAL_COMMUNITY): Payer: Medicaid Other

## 2011-10-25 ENCOUNTER — Ambulatory Visit (HOSPITAL_COMMUNITY): Payer: Medicaid Other

## 2011-10-30 ENCOUNTER — Inpatient Hospital Stay (HOSPITAL_COMMUNITY): Admission: RE | Admit: 2011-10-30 | Payer: Medicaid Other | Source: Ambulatory Visit

## 2011-12-02 LAB — CYTOLOGY - PAP: Pap: NEGATIVE

## 2012-01-03 ENCOUNTER — Other Ambulatory Visit: Payer: Self-pay | Admitting: Obstetrics

## 2012-01-25 ENCOUNTER — Encounter (HOSPITAL_COMMUNITY): Payer: Self-pay | Admitting: Pharmacy Technician

## 2012-01-28 ENCOUNTER — Inpatient Hospital Stay (HOSPITAL_COMMUNITY): Admission: RE | Admit: 2012-01-28 | Payer: Medicaid Other | Source: Ambulatory Visit

## 2012-01-28 ENCOUNTER — Other Ambulatory Visit (HOSPITAL_COMMUNITY): Payer: Medicaid Other

## 2012-01-31 NOTE — H&P (Signed)
NAME:  VERALYN, LOPP NO.:  [id number]  MEDICAL RECORD NO.:  [id number]  LOCATION:  [facility]                         FACILITY:  [facility]  PHYSICIAN:  Kathreen Cosier, M.D.DATE OF BIRTH:  January 12, 1981  DATE OF ADMISSION:  01/02/2012 DATE OF DISCHARGE:                             HISTORY & PHYSICAL   HISTORY OF PRESENT ILLNESS:  The patient is a 32 year old, gravida 2, para 2-0-0-2, who desires laparoscopic tubal sterilization.  It was explained to her the procedure can fail resulting in pregnancy in the tube or uterus.  PAST MEDICAL HISTORY:  Negative.  PAST SURGICAL HISTORY:  Negative.  SOCIAL HISTORY:  Negative.  SYSTEM REVIEW:  Noncontributory.  PHYSICAL EXAMINATION:  GENERAL:  A well-developed female in no distress. HEENT:  Negative. LUNGS:  Clear to P and A. HEART:  Regular rhythm.  No murmurs, no gallops. BREASTS:  No masses. ABDOMEN:  Not distended.  Negative exam. PELVIC:  Uterus normal.  Negative adnexa.  Cervix negative.  External genitalia normal. EXTREMITIES:  Negative.          ______________________________ Kathreen Cosier, M.D.     BAM/MEDQ  D:  01/30/2012  T:  01/31/2012  Job:  [id number]

## 2012-02-05 ENCOUNTER — Encounter (HOSPITAL_COMMUNITY): Admission: RE | Payer: Self-pay | Source: Ambulatory Visit

## 2012-02-05 ENCOUNTER — Ambulatory Visit (HOSPITAL_COMMUNITY): Admission: RE | Admit: 2012-02-05 | Payer: Medicaid Other | Source: Ambulatory Visit | Admitting: Obstetrics

## 2012-02-05 SURGERY — LIGATION, FALLOPIAN TUBE, LAPAROSCOPIC
Anesthesia: General | Laterality: Bilateral

## 2012-05-04 ENCOUNTER — Emergency Department (HOSPITAL_COMMUNITY): Payer: Medicaid Other

## 2012-05-04 ENCOUNTER — Encounter (HOSPITAL_COMMUNITY): Payer: Self-pay | Admitting: *Deleted

## 2012-05-04 ENCOUNTER — Emergency Department (HOSPITAL_COMMUNITY)
Admission: EM | Admit: 2012-05-04 | Discharge: 2012-05-04 | Disposition: A | Discharge status: Home / Self Care | Payer: Medicaid Other | Attending: Emergency Medicine | Admitting: Emergency Medicine

## 2012-05-04 DIAGNOSIS — Z7982 Long term (current) use of aspirin: Secondary | ICD-10-CM | POA: Insufficient documentation

## 2012-05-04 DIAGNOSIS — R6889 Other general symptoms and signs: Secondary | ICD-10-CM

## 2012-05-04 DIAGNOSIS — Z8742 Personal history of other diseases of the female genital tract: Secondary | ICD-10-CM | POA: Insufficient documentation

## 2012-05-04 DIAGNOSIS — Z87891 Personal history of nicotine dependence: Secondary | ICD-10-CM | POA: Insufficient documentation

## 2012-05-04 DIAGNOSIS — J029 Acute pharyngitis, unspecified: Secondary | ICD-10-CM | POA: Insufficient documentation

## 2012-05-04 DIAGNOSIS — H6690 Otitis media, unspecified, unspecified ear: Secondary | ICD-10-CM | POA: Insufficient documentation

## 2012-05-04 DIAGNOSIS — Z8632 Personal history of gestational diabetes: Secondary | ICD-10-CM | POA: Insufficient documentation

## 2012-05-04 DIAGNOSIS — Z8669 Personal history of other diseases of the nervous system and sense organs: Secondary | ICD-10-CM | POA: Insufficient documentation

## 2012-05-04 DIAGNOSIS — R509 Fever, unspecified: Secondary | ICD-10-CM | POA: Insufficient documentation

## 2012-05-04 DIAGNOSIS — H9209 Otalgia, unspecified ear: Secondary | ICD-10-CM | POA: Insufficient documentation

## 2012-05-04 LAB — RAPID STREP SCREEN (MED CTR MEBANE ONLY): Streptococcus, Group A Screen (Direct): NEGATIVE

## 2012-05-04 MED ORDER — AMOXICILLIN-POT CLAVULANATE 875-125 MG PO TABS: 1.0000 | ORAL_TABLET | Freq: Two times a day (BID) | ORAL | Status: DC

## 2012-05-04 MED ORDER — ACETAMINOPHEN 500 MG PO TABS: 500.0000 mg | ORAL_TABLET | Freq: Four times a day (QID) | ORAL | Status: DC | PRN

## 2012-05-04 NOTE — ED Notes (Signed)
Reports productive cough with green sputum and sore throat x 1-2 weeks. Woke up this am with severe right ear pain.

## 2012-05-04 NOTE — ED Provider Notes (Signed)
Medical screening examination/treatment/procedure(s) were performed by non-physician practitioner and as supervising physician I was immediately available for consultation/collaboration.   Jarryd Gratz H Chazz Philson, MD 05/04/12 1537 

## 2012-05-04 NOTE — ED Notes (Signed)
C/O cough x 1 week. Started yesterday with sore throat.

## 2012-05-04 NOTE — ED Provider Notes (Signed)
History     CSN: [id number]  Arrival date & time 05/04/12  1104   First MD Initiated Contact with Patient 05/04/12 1207      Chief Complaint  Patient presents with  . Cough  . Sore Throat  . Otalgia    (Consider location/radiation/quality/duration/timing/severity/associated sxs/prior treatment) Patient is a 32 y.o. female presenting with cough, pharyngitis, and ear pain. The history is provided by the patient. No language interpreter was used.  Cough Cough characteristics:  Productive Sputum characteristics:  Green Severity:  Moderate Onset quality:  Gradual Duration:  2 weeks Timing:  Intermittent Progression:  Unchanged Chronicity:  New Smoker: no   Context: sick contacts and upper respiratory infection   Relieved by:  Nothing Worsened by:  Nothing tried Ineffective treatments:  None tried Associated symptoms: ear pain   Sore Throat Associated symptoms include coughing.  Otalgia Associated symptoms: cough   Pt is a 32yo female presenting today with a productive cough with green sputum and sore throat for 1-2wks.  Pt also c/o severe right ear pain. Pt reports highest home temp of 101.4 yesterday.  Has been trying OTC cold medications along with tylenol and ibuprofen without relief.   Denies shortness of breath or hx of asthma. Denies n/v/d. Pt is able to swallow solids and liquids.  Past Medical History  Diagnosis Date  . Pregnancy induced hypertension   . Bilateral ovarian cysts   . Headache   . Gestational diabetes     History reviewed. No pertinent past surgical history.  Family History  Problem Relation Age of Onset  . Anesthesia problems Neg Hx   . Hypotension Neg Hx   . Malignant hyperthermia Neg Hx   . Pseudochol deficiency Neg Hx   . Diabetes Mother   . Hypertension Mother   . Diabetes Maternal Grandmother   . Hyperlipidemia Maternal Grandmother     History  Substance Use Topics  . Smoking status: Former Smoker    Types: Cigarettes  .  Smokeless tobacco: Never Used  . Alcohol Use: No     Comment: socially-not with pregnancy    OB History   Grav Para Term Preterm Abortions TAB SAB Ect Mult Living   2 2 2  0      2      Review of Systems  HENT: Positive for ear pain.   Respiratory: Positive for cough.     Allergies  Orange  Home Medications   Current Outpatient Rx  Name  Route  Sig  Dispense  Refill  . aspirin 325 MG tablet   Oral   Take 325 mg by mouth daily as needed for pain.         Marland Kitchen acetaminophen (TYLENOL) 500 MG tablet   Oral   Take 1 tablet (500 mg total) by mouth every 6 (six) hours as needed for pain.   30 tablet   0   . amoxicillin-clavulanate (AUGMENTIN) 875-125 MG per tablet   Oral   Take 1 tablet by mouth every 12 (twelve) hours.   14 tablet   0     BP 143/95  Pulse 82  Temp(Src) 97.9 F (36.6 C) (Oral)  Resp 16  SpO2 99%  LMP 04/29/2012  Physical Exam  Nursing note and vitals reviewed. Constitutional: She appears well-developed and well-nourished. No distress.  HENT:  Head: Normocephalic and atraumatic.  Right Ear: Hearing, external ear and ear canal normal. Tympanic membrane is injected, erythematous and bulging.  Left Ear: Hearing, tympanic membrane, external ear and  ear canal normal.  Nose: Mucosal edema and rhinorrhea ( clear) present.  Mouth/Throat: Uvula is midline and mucous membranes are normal. Posterior oropharyngeal edema and posterior oropharyngeal erythema present. No oropharyngeal exudate or tonsillar abscesses.  Eyes: Conjunctivae are normal. Right eye exhibits no discharge. Left eye exhibits no discharge. No scleral icterus.  Neck: Normal range of motion. Neck supple. No JVD present. No tracheal deviation present. No thyromegaly present.  Cardiovascular: Normal rate, regular rhythm and normal heart sounds.   Pulmonary/Chest: Effort normal and breath sounds normal. No stridor. No respiratory distress. She has no wheezes. She has no rales. She exhibits no  tenderness.  Abdominal: Soft. Bowel sounds are normal. She exhibits no distension and no mass. There is no tenderness. There is no rebound and no guarding.  Musculoskeletal: Normal range of motion.  Lymphadenopathy:    She has no cervical adenopathy.  Neurological: She is alert.  Skin: Skin is warm and dry. She is not diaphoretic.    ED Course  Procedures (including critical care time)  Labs Reviewed  RAPID STREP SCREEN   Dg Chest 2 View  05/04/2012  *RADIOLOGY REPORT*  Clinical Data: Cough and sore throat  CHEST - 2 VIEW  Comparison:  February 12, 2006  Findings:  Lungs clear.  Heart size and pulmonary vascularity normal.  No adenopathy.  No bone lesions.  IMPRESSION: No abnormality noted.   Original Report Authenticated By: Bretta Bang, M.D.      1. AOM (acute otitis media), right   2. Flu-like symptoms   3. Sore throat       MDM  Pt is a 31yo female  c/o 1-2wk hx of productive cough with yellow-green sputum, associated with severe right otalgia and sore throat.  Pt has tried OTC cold medication including tylenol and ibuprofen without pain relief.  Reports highest home temp was 101.4.  Pt is able to swallow solids and liquids.  Denies shortness of breath. Denies hx of asthma.    CXR: negative for pneumonia, no abnormality noted.   Rapid Strep: negative  PE: HENT:  right TM-bulging and erythremic, ear tender on exam.  throat: bilateral tonsillar hypertrophy and erythema without exudate or abscess    Lungs: CTAB  Dx: AOM (right ear), sore throat with flu like symptoms  Tx: Augmentin and acetaminophen    Will have pt f/u with PCP or urgent care if symptoms not improving after completion of antibiotics or sooner if symptoms begin to worsen.  Return to ED if unable to swallow solids or liquids or if pt has difficulty breathing.   Vitals: unremarkable. Discharged in stable condition.    Discussed pt with attending during ED encounter.       Junius Finner,  PA-C 05/04/12 1229

## 2012-12-23 ENCOUNTER — Emergency Department (HOSPITAL_COMMUNITY): Payer: Medicaid Other

## 2012-12-23 ENCOUNTER — Emergency Department (HOSPITAL_COMMUNITY)
Admission: EM | Admit: 2012-12-23 | Discharge: 2012-12-23 | Disposition: A | Discharge status: Home / Self Care | Payer: Medicaid Other | Attending: Emergency Medicine | Admitting: Emergency Medicine

## 2012-12-23 ENCOUNTER — Encounter (HOSPITAL_COMMUNITY): Payer: Self-pay | Admitting: Emergency Medicine

## 2012-12-23 DIAGNOSIS — J00 Acute nasopharyngitis [common cold]: Secondary | ICD-10-CM | POA: Insufficient documentation

## 2012-12-23 DIAGNOSIS — IMO0001 Reserved for inherently not codable concepts without codable children: Secondary | ICD-10-CM | POA: Insufficient documentation

## 2012-12-23 DIAGNOSIS — J111 Influenza due to unidentified influenza virus with other respiratory manifestations: Secondary | ICD-10-CM | POA: Insufficient documentation

## 2012-12-23 DIAGNOSIS — Z3202 Encounter for pregnancy test, result negative: Secondary | ICD-10-CM | POA: Insufficient documentation

## 2012-12-23 DIAGNOSIS — J22 Unspecified acute lower respiratory infection: Secondary | ICD-10-CM

## 2012-12-23 DIAGNOSIS — J029 Acute pharyngitis, unspecified: Secondary | ICD-10-CM | POA: Insufficient documentation

## 2012-12-23 DIAGNOSIS — N390 Urinary tract infection, site not specified: Secondary | ICD-10-CM | POA: Insufficient documentation

## 2012-12-23 DIAGNOSIS — Z8632 Personal history of gestational diabetes: Secondary | ICD-10-CM | POA: Insufficient documentation

## 2012-12-23 DIAGNOSIS — G43909 Migraine, unspecified, not intractable, without status migrainosus: Secondary | ICD-10-CM | POA: Insufficient documentation

## 2012-12-23 DIAGNOSIS — J45909 Unspecified asthma, uncomplicated: Secondary | ICD-10-CM | POA: Insufficient documentation

## 2012-12-23 DIAGNOSIS — Z87891 Personal history of nicotine dependence: Secondary | ICD-10-CM | POA: Insufficient documentation

## 2012-12-23 DIAGNOSIS — R197 Diarrhea, unspecified: Secondary | ICD-10-CM | POA: Insufficient documentation

## 2012-12-23 DIAGNOSIS — H53149 Visual discomfort, unspecified: Secondary | ICD-10-CM | POA: Insufficient documentation

## 2012-12-23 DIAGNOSIS — R110 Nausea: Secondary | ICD-10-CM | POA: Insufficient documentation

## 2012-12-23 LAB — COMPREHENSIVE METABOLIC PANEL
AST: 19 U/L (ref 0–37)
Albumin: 4.4 g/dL (ref 3.5–5.2)
BUN: 9 mg/dL (ref 6–23)
Calcium: 9.4 mg/dL (ref 8.4–10.5)
Creatinine, Ser: 0.84 mg/dL (ref 0.50–1.10)
Total Protein: 8 g/dL (ref 6.0–8.3)

## 2012-12-23 LAB — CBC WITH DIFFERENTIAL/PLATELET
Basophils Absolute: 0 10*3/uL (ref 0.0–0.1)
Basophils Relative: 1 % (ref 0–1)
Eosinophils Absolute: 0.2 10*3/uL (ref 0.0–0.7)
Eosinophils Relative: 2 % (ref 0–5)
HCT: 38.3 % (ref 36.0–46.0)
MCH: 31 pg (ref 26.0–34.0)
MCHC: 34.2 g/dL (ref 30.0–36.0)
Monocytes Absolute: 0.6 10*3/uL (ref 0.1–1.0)
Monocytes Relative: 7 % (ref 3–12)
Neutro Abs: 3.7 10*3/uL (ref 1.7–7.7)
RDW: 14.5 % (ref 11.5–15.5)

## 2012-12-23 LAB — URINALYSIS, ROUTINE W REFLEX MICROSCOPIC
Glucose, UA: NEGATIVE mg/dL
Ketones, ur: 15 mg/dL — AB
Nitrite: POSITIVE — AB
pH: 6 (ref 5.0–8.0)

## 2012-12-23 LAB — RAPID STREP SCREEN (MED CTR MEBANE ONLY): Streptococcus, Group A Screen (Direct): NEGATIVE

## 2012-12-23 LAB — URINE MICROSCOPIC-ADD ON

## 2012-12-23 MED ORDER — ONDANSETRON HCL 4 MG/2ML IJ SOLN
4.0000 mg | Freq: Once | INTRAMUSCULAR | Status: AC
  Administered 2012-12-23: 4 mg via INTRAVENOUS
  Filled 2012-12-23: qty 2

## 2012-12-23 MED ORDER — ALBUTEROL SULFATE HFA 108 (90 BASE) MCG/ACT IN AERS: 2.0000 | INHALATION_SPRAY | RESPIRATORY_TRACT | Status: DC | PRN

## 2012-12-23 MED ORDER — MORPHINE SULFATE 4 MG/ML IJ SOLN
4.0000 mg | Freq: Once | INTRAMUSCULAR | Status: AC
  Administered 2012-12-23: 4 mg via INTRAVENOUS
  Filled 2012-12-23: qty 1

## 2012-12-23 MED ORDER — AMOXICILLIN 500 MG PO CAPS: 500.0000 mg | ORAL_CAPSULE | Freq: Three times a day (TID) | ORAL | Status: DC

## 2012-12-23 MED ORDER — ALBUTEROL SULFATE (5 MG/ML) 0.5% IN NEBU
5.0000 mg | INHALATION_SOLUTION | Freq: Once | RESPIRATORY_TRACT | Status: AC
  Administered 2012-12-23: 5 mg via RESPIRATORY_TRACT
  Filled 2012-12-23: qty 1

## 2012-12-23 MED ORDER — HYDROCODONE-HOMATROPINE 5-1.5 MG/5ML PO SYRP: 5.0000 mL | ORAL_SOLUTION | Freq: Four times a day (QID) | ORAL | Status: DC | PRN

## 2012-12-23 MED ORDER — SODIUM CHLORIDE 0.9 % IV BOLUS (SEPSIS)
1000.0000 mL | Freq: Once | INTRAVENOUS | Status: AC
  Administered 2012-12-23: 1000 mL via INTRAVENOUS

## 2012-12-23 NOTE — ED Notes (Signed)
Cough cold and hoarseness x 3 days states kids are sick also

## 2012-12-23 NOTE — ED Notes (Signed)
Patient transported to X-ray 

## 2012-12-23 NOTE — ED Provider Notes (Signed)
  Medical screening examination/treatment/procedure(s) were performed by non-physician practitioner and as supervising physician I was immediately available for consultation/collaboration.  EKG Interpretation   None          Latrease Kunde, MD 12/23/12 1645 

## 2012-12-23 NOTE — ED Provider Notes (Signed)
CSN: [id number]     Arrival date & time 12/23/12  4098 History   First MD Initiated Contact with Patient 12/23/12 [phone]     Chief Complaint  Patient presents with  . URI   (Consider location/radiation/quality/duration/timing/severity/associated sxs/prior Treatment) HPI  This is a 32 y/o F w/ cc sxs of URI.  Onset of sxs 3 days ago. C/o pain with cough, fever, intermittent severe headaches with photophobia/phonophobia, diffuse myalgias, sore throat. Patient states her children have the same virus. She has had decreased appetite, difficulty swallowing, mild nausea, loose stools. She has a history of migraine HAs and states her headaches are the same. Denies  UL throbbing,  visual changes, stiff neck, neck pain, rash, or "thunderclap" onset. She has been using excedrine migraine intermittent relief. She has been able to hold down fluids.  Denies fevers.. Denies DOE, SOB, chest tightness or pressure, radiation to left arm, jaw or back, or diaphoresis. Denies dysuria, flank pain, suprapubic pain, frequency, urgency, or hematuria. Denies headaches, light headedness, weakness, visual disturbances. Denies abdominal pain.    Past Medical History  Diagnosis Date  . Pregnancy induced hypertension   . Bilateral ovarian cysts   . Headache(784.0)   . Gestational diabetes    History reviewed. No pertinent past surgical history. Family History  Problem Relation Age of Onset  . Anesthesia problems Neg Hx   . Hypotension Neg Hx   . Malignant hyperthermia Neg Hx   . Pseudochol deficiency Neg Hx   . Diabetes Mother   . Hypertension Mother   . Diabetes Maternal Grandmother   . Hyperlipidemia Maternal Grandmother    History  Substance Use Topics  . Smoking status: Former Smoker    Types: Cigarettes  . Smokeless tobacco: Never Used  . Alcohol Use: No     Comment: socially-not with pregnancy   OB History   Grav Para Term Preterm Abortions TAB SAB Ect Mult Living   2 2 2  0      2     Review of  Systems Ten systems reviewed and are negative for acute change, except as noted in the HPI.   Allergies  Orange  Home Medications   Current Outpatient Rx  Name  Route  Sig  Dispense  Refill  . Aspirin-Acetaminophen-Caffeine (MIGRAINE RELIEF PO)   Oral   Take 3 tablets by mouth daily as needed (for severe migraine).          BP 184/104  Pulse 109  Temp(Src) 98.3 F (36.8 C) (Oral)  Resp 18  SpO2 99%  LMP 12/02/2012 Physical Exam Appears moderately ill but not toxic; temperature as noted in vitals. Hypertensive and tearful. Ears normal. Eyes:glassy,injected appearance, no discharge, no icterus Heart: Tachycardic, NO M/G/R Throat and pharynx normal.   Neck supple.FROM No adenopathyhy in the neck.  Sinuses non tender.  Chest with tight bronchitic cough, tight wheeze. Abdomen is soft and nontender.  ED Course  Procedures (including critical care time) Labs Review Labs Reviewed  URINALYSIS, ROUTINE W REFLEX MICROSCOPIC - Abnormal; Notable for the following:    APPearance CLOUDY (*)    Ketones, ur 15 (*)    Nitrite POSITIVE (*)    Leukocytes, UA TRACE (*)    All other components within normal limits  URINE MICROSCOPIC-ADD ON - Abnormal; Notable for the following:    Squamous Epithelial / LPF FEW (*)    Bacteria, UA MANY (*)    All other components within normal limits  RAPID STREP SCREEN  CULTURE, GROUP A  STREP  URINE CULTURE  CBC WITH DIFFERENTIAL  COMPREHENSIVE METABOLIC PANEL  POCT PREGNANCY, URINE   Imaging Review Dg Chest 2 View  12/23/2012   CLINICAL DATA:  Cough and congestion.  Wheezing.  EXAM: CHEST  2 VIEW  COMPARISON:  05/04/2012  FINDINGS: Mild central airway thickening. No airspace opacity. Cardiac and mediastinal margins appear normal. No pleural effusion identified.  IMPRESSION: 1. Airway thickening is present, suggesting bronchitis or reactive airways disease.   Electronically Signed   By: Herbie Baltimore M.D.   On: 12/23/2012 09:25    EKG  Interpretation   None       MDM   1. Chest cold   2. RAD (reactive airway disease)   3. Influenza-like illness    patient with ILI,  cxr shows bronchitis v. RAD. Will give the albuterol treatment, fluids, morphine, and check strep.  12:35 PM UA positive for UTI  Patient with symptoms consistent with influenza.  Vitals are stable, low-grade fever.  Patient given fluids tolerating PO's.  Lungs are clear after neb treatment. .  Out of window for Tamiflu.  Patient will be discharged with instructions to orally hydrate, rest, and use over-the-counter medications such as anti-inflammatories ibuprofen and Aleve for muscle aches and Tylenol for fever.  Patient will also be given a cough suppressant.    Arthor Captain, PA-C 12/23/12 1240

## 2012-12-23 NOTE — ED Notes (Signed)
Pt returned from radiology.

## 2012-12-23 NOTE — ED Notes (Addendum)
Pt c/o cough and body aches X 3 days. Reports she has been coughing up light green mucus. Also c/o HA for about a week, sts she was taken off her BP meds but has felt like her BP has been elevated. Has been taking tylenol at home but hast to take it about 2-3 times a day to get relief. Nad, skin warm and dry, resp e/u.

## 2012-12-23 NOTE — ED Notes (Signed)
Report received, assumed care.  

## 2012-12-25 ENCOUNTER — Telehealth (HOSPITAL_COMMUNITY): Payer: Self-pay | Admitting: Emergency Medicine

## 2012-12-25 LAB — CULTURE, GROUP A STREP

## 2012-12-25 LAB — URINE CULTURE: Colony Count: >=100000

## 2012-12-25 NOTE — ED Notes (Signed)
Post ED Visit - Positive Culture Follow-up  Culture report reviewed by antimicrobial stewardship pharmacist: []  Wes Dulaney, Pharm.D., BCPS [x]  Celedonio Miyamoto, Pharm.D., BCPS []  Georgina Pillion, Pharm.D., BCPS []  [place], [address].D., BCPS, AAHIVP []  Estella Husk, Pharm.D., BCPS, AAHIVP  Positive urine culture Per Johnnette Gourd PA-C, no treatment needed and no further patient follow-up is required at this time.  Kylie A Holland 12/25/2012, 11:39 AM

## 2013-02-03 ENCOUNTER — Emergency Department (HOSPITAL_COMMUNITY)
Admission: EM | Admit: 2013-02-03 | Discharge: 2013-02-03 | Disposition: A | Discharge status: Home / Self Care | Payer: Medicaid Other | Attending: Emergency Medicine | Admitting: Emergency Medicine

## 2013-02-03 ENCOUNTER — Emergency Department (HOSPITAL_COMMUNITY): Payer: Medicaid Other

## 2013-02-03 ENCOUNTER — Encounter (HOSPITAL_COMMUNITY): Payer: Self-pay | Admitting: Emergency Medicine

## 2013-02-03 DIAGNOSIS — Z87891 Personal history of nicotine dependence: Secondary | ICD-10-CM | POA: Insufficient documentation

## 2013-02-03 DIAGNOSIS — Z8632 Personal history of gestational diabetes: Secondary | ICD-10-CM | POA: Insufficient documentation

## 2013-02-03 DIAGNOSIS — Z8742 Personal history of other diseases of the female genital tract: Secondary | ICD-10-CM | POA: Insufficient documentation

## 2013-02-03 DIAGNOSIS — J069 Acute upper respiratory infection, unspecified: Secondary | ICD-10-CM

## 2013-02-03 DIAGNOSIS — Z79899 Other long term (current) drug therapy: Secondary | ICD-10-CM | POA: Insufficient documentation

## 2013-02-03 MED ORDER — IBUPROFEN 400 MG PO TABS
600.0000 mg | ORAL_TABLET | Freq: Once | ORAL | Status: AC
  Administered 2013-02-03: 600 mg via ORAL
  Filled 2013-02-03 (×2): qty 1

## 2013-02-03 NOTE — Discharge Instructions (Signed)
A chest x-ray did not  reveal any pneumonia.  At this time.  Please use your inhaler every 4-6 hours for the next several days, while you're awake take alternating doses of Tylenol or ibuprofen for discomfort.  Patient to get plenty of rest and drink plenty of fluids

## 2013-02-03 NOTE — ED Notes (Addendum)
Pt in c/o cough, congestion and body aches since Monday, symptoms have gotten progressively worse, no distress at this time, also c/o headache

## 2013-02-03 NOTE — ED Provider Notes (Signed)
Medical screening examination/treatment/procedure(s) were performed by non-physician practitioner and as supervising physician I was immediately available for consultation/collaboration.     Veryl Speak, MD 02/03/13 [phone]

## 2013-02-03 NOTE — ED Provider Notes (Signed)
CSN: [id number]     Arrival date & time 02/03/13  2001 History   First MD Initiated Contact with Patient 02/03/13 2226     Chief Complaint  Patient presents with  . URI  . Generalized Body Aches   (Consider location/radiation/quality/duration/timing/severity/associated sxs/prior Treatment) HPI Comments: Patient states, that she's had URI symptoms for the past 3, days.  She's had intermittent, cough that resolves with the use of an inhaler.  She denies fever, or myalgias.  She has not taken anything for pain, or congestion  Patient is a 33 y.o. female presenting with URI. The history is provided by the patient.  URI Presenting symptoms: congestion, cough and rhinorrhea   Presenting symptoms: no ear pain, no fever and no sore throat   Severity:  Moderate Onset quality:  Gradual Duration:  3 days Timing:  Constant Progression:  Unchanged Chronicity:  New Relieved by:  Nebulizer treatments Associated symptoms: myalgias   Associated symptoms: no headaches and no wheezing     Past Medical History  Diagnosis Date  . Pregnancy induced hypertension   . Bilateral ovarian cysts   . Headache(784.0)   . Gestational diabetes    History reviewed. No pertinent past surgical history. Family History  Problem Relation Age of Onset  . Anesthesia problems Neg Hx   . Hypotension Neg Hx   . Malignant hyperthermia Neg Hx   . Pseudochol deficiency Neg Hx   . Diabetes Mother   . Hypertension Mother   . Diabetes Maternal Grandmother   . Hyperlipidemia Maternal Grandmother    History  Substance Use Topics  . Smoking status: Former Smoker    Types: Cigarettes  . Smokeless tobacco: Never Used  . Alcohol Use: No     Comment: socially-not with pregnancy   OB History   Grav Para Term Preterm Abortions TAB SAB Ect Mult Living   2 2 2  0      2     Review of Systems  Constitutional: Negative for fever and chills.  HENT: Positive for congestion and rhinorrhea. Negative for ear pain and sore  throat.   Respiratory: Positive for cough. Negative for shortness of breath and wheezing.   Gastrointestinal: Negative for nausea and vomiting.  Musculoskeletal: Positive for myalgias.  Neurological: Negative for dizziness and headaches.  All other systems reviewed and are negative.    Allergies  Orange  Home Medications   Current Outpatient Rx  Name  Route  Sig  Dispense  Refill  . albuterol (PROVENTIL HFA;VENTOLIN HFA) 108 (90 BASE) MCG/ACT inhaler   Inhalation   Inhale 2 puffs into the lungs every 4 (four) hours as needed for wheezing or shortness of breath.   1 Inhaler   0   . guaiFENesin (ROBITUSSIN) 100 MG/5ML liquid   Oral   Take 200 mg by mouth 3 (three) times daily as needed for cough.          BP 155/108  Pulse 95  Temp(Src) 99.6 F (37.6 C) (Oral)  Resp 24  Wt 220 lb (99.791 kg)  SpO2 99%  LMP 01/23/2013 Physical Exam  Nursing note and vitals reviewed. Constitutional: She appears well-developed and well-nourished.  HENT:  Head: Normocephalic.  Eyes: Pupils are equal, round, and reactive to light.  Neck: Normal range of motion.  Cardiovascular: Normal rate.   Pulmonary/Chest: Effort normal.  Abdominal: Soft.  Musculoskeletal: Normal range of motion.  Lymphadenopathy:    She has no cervical adenopathy.  Neurological: She is alert.  Skin: Skin is warm  and dry.    ED Course  Procedures (including critical care time) Labs Review Labs Reviewed - No data to display Imaging Review Dg Chest 2 View  02/03/2013   CLINICAL DATA:  Chest pain, cough, body aches  EXAM: CHEST  2 VIEW  COMPARISON:  Prior chest x-ray 12/23/2012  FINDINGS: Cardiac and mediastinal contours within normal limits. Slightly increased interstitial markings with linear peribronchovascular opacities in both lung bases. No focal airspace consolidation, pleural effusion or pneumothorax. No acute osseous abnormality.  IMPRESSION: Slightly increased interstitial markings with linear opacities  in both lung base is favored to reflect atelectasis. While nonspecific, similar findings can be seen in viral respiratory infection.   Electronically Signed   By: Jacqulynn Cadet M.D.   On: 02/03/2013 20:32    EKG Interpretation   None       MDM   1. URI (upper respiratory infection)     Is encouraged to use her inhaler on a regular basis for the next several days, as well as taking alternating doses of Tylenol or ibuprofen for discomfort, to 6, are received.  There is no pneumonia   Garald Balding, NP 02/03/13 2301

## 2013-04-24 ENCOUNTER — Emergency Department (HOSPITAL_COMMUNITY)
Admission: EM | Admit: 2013-04-24 | Discharge: 2013-04-24 | Disposition: A | Discharge status: Home / Self Care | Payer: Medicaid Other | Source: Home / Self Care | Attending: Family Medicine | Admitting: Family Medicine

## 2013-04-24 ENCOUNTER — Encounter (HOSPITAL_COMMUNITY): Payer: Self-pay | Admitting: Emergency Medicine

## 2013-04-24 DIAGNOSIS — S61209A Unspecified open wound of unspecified finger without damage to nail, initial encounter: Secondary | ICD-10-CM

## 2013-04-24 DIAGNOSIS — S61019A Laceration without foreign body of unspecified thumb without damage to nail, initial encounter: Secondary | ICD-10-CM

## 2013-04-24 DIAGNOSIS — I10 Essential (primary) hypertension: Secondary | ICD-10-CM

## 2013-04-24 NOTE — ED Notes (Signed)
Pt reports laceration to left thumb around 1430 today Believes it may have been while opening a screen door but pt is not sure??? Bleeding controlled; last tetanus unknown Alert w/no signs of acute distress.

## 2013-04-24 NOTE — Discharge Instructions (Signed)
Laceration Care, Adult °A laceration is a cut or lesion that goes through all layers of the skin and into the tissue just beneath the skin. °TREATMENT  °Some lacerations may not require closure. Some lacerations may not be able to be closed due to an increased risk of infection. It is important to see your caregiver as soon as possible after an injury to minimize the risk of infection and maximize the opportunity for successful closure. °If closure is appropriate, pain medicines may be given, if needed. The wound will be cleaned to help prevent infection. Your caregiver will use stitches (sutures), staples, wound glue (adhesive), or skin adhesive strips to repair the laceration. These tools bring the skin edges together to allow for faster healing and a better cosmetic outcome. However, all wounds will heal with a scar. Once the wound has healed, scarring can be minimized by covering the wound with sunscreen during the day for 1 full year. °HOME CARE INSTRUCTIONS  °For sutures or staples: °· Keep the wound clean and dry. °· If you were given a bandage (dressing), you should change it at least once a day. Also, change the dressing if it becomes wet or dirty, or as directed by your caregiver. °· Wash the wound with soap and water 2 times a day. Rinse the wound off with water to remove all soap. Pat the wound dry with a clean towel. °· After cleaning, apply a thin layer of the antibiotic ointment as recommended by your caregiver. This will help prevent infection and keep the dressing from sticking. °· You may shower as usual after the first 24 hours. Do not soak the wound in water until the sutures are removed. °· Only take over-the-counter or prescription medicines for pain, discomfort, or fever as directed by your caregiver. °· Get your sutures or staples removed as directed by your caregiver. °For skin adhesive strips: °· Keep the wound clean and dry. °· Do not get the skin adhesive strips wet. You may bathe  carefully, using caution to keep the wound dry. °· If the wound gets wet, pat it dry with a clean towel. °· Skin adhesive strips will fall off on their own. You may trim the strips as the wound heals. Do not remove skin adhesive strips that are still stuck to the wound. They will fall off in time. °For wound adhesive: °· You may briefly wet your wound in the shower or bath. Do not soak or scrub the wound. Do not swim. Avoid periods of heavy perspiration until the skin adhesive has fallen off on its own. After showering or bathing, gently pat the wound dry with a clean towel. °· Do not apply liquid medicine, cream medicine, or ointment medicine to your wound while the skin adhesive is in place. This may loosen the film before your wound is healed. °· If a dressing is placed over the wound, be careful not to apply tape directly over the skin adhesive. This may cause the adhesive to be pulled off before the wound is healed. °· Avoid prolonged exposure to sunlight or tanning lamps while the skin adhesive is in place. Exposure to ultraviolet light in the first year will darken the scar. °· The skin adhesive will usually remain in place for 5 to 10 days, then naturally fall off the skin. Do not pick at the adhesive film. °You may need a tetanus shot if: °· You cannot remember when you had your last tetanus shot. °· You have never had a tetanus   shot. If you get a tetanus shot, your arm may swell, get red, and feel warm to the touch. This is common and not a problem. If you need a tetanus shot and you choose not to have one, there is a rare chance of getting tetanus. Sickness from tetanus can be serious. SEEK MEDICAL CARE IF:   You have redness, swelling, or increasing pain in the wound.  You see a red line that goes away from the wound.  You have yellowish-white fluid (pus) coming from the wound.  You have a fever.  You notice a bad smell coming from the wound or dressing.  Your wound breaks open before or  after sutures have been removed.  You notice something coming out of the wound such as wood or glass.  Your wound is on your hand or foot and you cannot move a finger or toe. SEEK IMMEDIATE MEDICAL CARE IF:   Your pain is not controlled with prescribed medicine.  You have severe swelling around the wound causing pain and numbness or a change in color in your arm, hand, leg, or foot.  Your wound splits open and starts bleeding.  You have worsening numbness, weakness, or loss of function of any joint around or beyond the wound.  You develop painful lumps near the wound or on the skin anywhere on your body. MAKE SURE YOU:   Understand these instructions.  Will watch your condition.  Will get help right away if you are not doing well or get worse. Document Released: 01/07/2005 Document Revised: 04/01/2011 Document Reviewed: 07/03/2010 [facility] Patient Information 2014 [place], [place].  Hypertension Hypertension is another name for high blood pressure. High blood pressure may mean that your heart needs to work harder to pump blood. Blood pressure consists of two numbers, which includes a higher number over a lower number (example: 110/72). HOME CARE   Make lifestyle changes as told by your doctor. This may include weight loss and exercise.  Take your blood pressure medicine every day.  Limit how much salt you use.  Stop smoking if you smoke.  Do not use drugs.  Talk to your doctor if you are using decongestants or birth control pills. These medicines might make blood pressure higher.  Females should not drink more than 1 alcoholic drink per day. Males should not drink more than 2 alcoholic drinks per day.  See your doctor as told. GET HELP RIGHT AWAY IF:   You have a blood pressure reading with a top number of 180 or higher.  You get a very bad headache.  You get blurred or changing vision.  You feel confused.  You feel weak, numb, or faint.  You get chest or belly  (abdominal) pain.  You throw up (vomit).  You cannot breathe very well. MAKE SURE YOU:   Understand these instructions.  Will watch your condition.  Will get help right away if you are not doing well or get worse. Document Released: 06/26/2007 Document Revised: 04/01/2011 Document Reviewed: 06/26/2007 [facility] Patient Information 2014 [place], [place].

## 2013-04-24 NOTE — ED Provider Notes (Signed)
CSN: [id number]     Arrival date & time 04/24/13  1434 History   First MD Initiated Contact with Patient 04/24/13 1555     Chief Complaint  Patient presents with  . Extremity Laceration   (Consider location/radiation/quality/duration/timing/severity/associated sxs/prior Treatment) HPI Comments: 33 year old female presents complaining of a laceration to the end of her left thumb sustained about one and a half hours ago. She doesn't know exactly what happened and how she cut herself, but she just noticed that her thumb was bleeding. There is no pain in the thumb and the bleeding has been controlled with direct pressure. No numbness in the thumb. No other injuries, no other symptoms.   Past Medical History  Diagnosis Date  . Pregnancy induced hypertension   . Bilateral ovarian cysts   . Headache(784.0)   . Gestational diabetes    History reviewed. No pertinent past surgical history. Family History  Problem Relation Age of Onset  . Anesthesia problems Neg Hx   . Hypotension Neg Hx   . Malignant hyperthermia Neg Hx   . Pseudochol deficiency Neg Hx   . Diabetes Mother   . Hypertension Mother   . Diabetes Maternal Grandmother   . Hyperlipidemia Maternal Grandmother    History  Substance Use Topics  . Smoking status: Former Smoker    Types: Cigarettes  . Smokeless tobacco: Never Used  . Alcohol Use: No     Comment: socially-not with pregnancy   OB History   Grav Para Term Preterm Abortions TAB SAB Ect Mult Living   2 2 2  0      2     Review of Systems  Skin: Positive for wound.  Neurological: Negative for numbness.  Hematological: Does not bruise/bleed easily.  All other systems reviewed and are negative.    Allergies  Orange  Home Medications   Current Outpatient Rx  Name  Route  Sig  Dispense  Refill  . albuterol (PROVENTIL HFA;VENTOLIN HFA) 108 (90 BASE) MCG/ACT inhaler   Inhalation   Inhale 2 puffs into the lungs every 4 (four) hours as needed for wheezing or  shortness of breath.   1 Inhaler   0   . guaiFENesin (ROBITUSSIN) 100 MG/5ML liquid   Oral   Take 200 mg by mouth 3 (three) times daily as needed for cough.          BP 172/110  Pulse 84  Temp(Src) 98.1 F (36.7 C) (Oral)  Resp 16  SpO2 100%  LMP 04/14/2013  Breastfeeding? No Physical Exam  Nursing note and vitals reviewed. Constitutional: She is oriented to person, place, and time. Vital signs are normal. She appears well-developed and well-nourished. No distress.  HENT:  Head: Normocephalic and atraumatic.  Pulmonary/Chest: Effort normal. No respiratory distress.  Musculoskeletal:       Left hand: She exhibits laceration (laceration medial portion of left thumb just beside the nail and around the end of the nail. The laceration is through all layers of skin but the wound does not separate, not deep enough to require sutures ).  Neurological: She is alert and oriented to person, place, and time. She has normal strength. Coordination normal.  Skin: Skin is warm and dry. No rash noted. She is not diaphoretic.  Psychiatric: She has a normal mood and affect. Judgment normal.    ED Course  Procedures (including critical care time) Labs Review Labs Reviewed - No data to display Imaging Review No results found.   MDM   1. Laceration of  thumb   2. High blood pressure    Wound cleaned with chlorhexidine soap and rinse with cold running water, antibacterial ointment applied, Band-Aid applied. Followup if any signs of infection, otherwise keep clean and covered. Followup with the [facility] to establish primary care for evaluation and treatment of high blood pressure     Liam Graham, PA-C 04/24/13 1622

## 2013-04-24 NOTE — ED Provider Notes (Signed)
Medical screening examination/treatment/procedure(s) were performed by resident physician or non-physician practitioner and as supervising physician I was immediately available for consultation/collaboration.   Marijane Trower DOUGLAS MD.   Oleta Gunnoe D Krystall Kruckenberg, MD 04/24/13 1754 

## 2013-06-03 ENCOUNTER — Encounter (HOSPITAL_COMMUNITY): Payer: Self-pay | Admitting: Emergency Medicine

## 2013-06-03 ENCOUNTER — Emergency Department (HOSPITAL_COMMUNITY)
Admission: EM | Admit: 2013-06-03 | Discharge: 2013-06-03 | Disposition: A | Discharge status: Home / Self Care | Payer: Medicaid Other | Attending: Emergency Medicine | Admitting: Emergency Medicine

## 2013-06-03 ENCOUNTER — Emergency Department (HOSPITAL_COMMUNITY): Payer: Medicaid Other

## 2013-06-03 DIAGNOSIS — S0993XA Unspecified injury of face, initial encounter: Secondary | ICD-10-CM | POA: Insufficient documentation

## 2013-06-03 DIAGNOSIS — IMO0002 Reserved for concepts with insufficient information to code with codable children: Secondary | ICD-10-CM | POA: Diagnosis not present

## 2013-06-03 DIAGNOSIS — J45909 Unspecified asthma, uncomplicated: Secondary | ICD-10-CM | POA: Insufficient documentation

## 2013-06-03 DIAGNOSIS — S298XXA Other specified injuries of thorax, initial encounter: Secondary | ICD-10-CM | POA: Insufficient documentation

## 2013-06-03 DIAGNOSIS — Z87891 Personal history of nicotine dependence: Secondary | ICD-10-CM | POA: Insufficient documentation

## 2013-06-03 DIAGNOSIS — I10 Essential (primary) hypertension: Secondary | ICD-10-CM | POA: Diagnosis not present

## 2013-06-03 DIAGNOSIS — S199XXA Unspecified injury of neck, initial encounter: Secondary | ICD-10-CM

## 2013-06-03 DIAGNOSIS — Y9389 Activity, other specified: Secondary | ICD-10-CM | POA: Insufficient documentation

## 2013-06-03 DIAGNOSIS — Z8632 Personal history of gestational diabetes: Secondary | ICD-10-CM | POA: Diagnosis not present

## 2013-06-03 DIAGNOSIS — Z8742 Personal history of other diseases of the female genital tract: Secondary | ICD-10-CM | POA: Insufficient documentation

## 2013-06-03 DIAGNOSIS — S43409A Unspecified sprain of unspecified shoulder joint, initial encounter: Secondary | ICD-10-CM

## 2013-06-03 DIAGNOSIS — S4980XA Other specified injuries of shoulder and upper arm, unspecified arm, initial encounter: Secondary | ICD-10-CM | POA: Diagnosis present

## 2013-06-03 DIAGNOSIS — S46909A Unspecified injury of unspecified muscle, fascia and tendon at shoulder and upper arm level, unspecified arm, initial encounter: Secondary | ICD-10-CM | POA: Diagnosis present

## 2013-06-03 DIAGNOSIS — Y92410 Unspecified street and highway as the place of occurrence of the external cause: Secondary | ICD-10-CM | POA: Diagnosis not present

## 2013-06-03 DIAGNOSIS — Z79899 Other long term (current) drug therapy: Secondary | ICD-10-CM | POA: Insufficient documentation

## 2013-06-03 MED ORDER — OXYCODONE-ACETAMINOPHEN 5-325 MG PO TABS: 2.0000 | ORAL_TABLET | Freq: Once | ORAL | Status: DC

## 2013-06-03 MED ORDER — OXYCODONE-ACETAMINOPHEN 5-325 MG PO TABS
2.0000 | ORAL_TABLET | Freq: Once | ORAL | Status: AC
  Administered 2013-06-03: 2 via ORAL
  Filled 2013-06-03: qty 2
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
## 2013-06-03 NOTE — ED Notes (Signed)
Bed: [facility] Expected date:  Expected time:  Means of arrival:  Comments: EMS 
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
## 2013-09-24 ENCOUNTER — Emergency Department (HOSPITAL_COMMUNITY)
Admission: EM | Admit: 2013-09-24 | Discharge: 2013-09-24 | Disposition: A | Discharge status: Home / Self Care | Payer: No Typology Code available for payment source | Attending: Emergency Medicine | Admitting: Emergency Medicine

## 2013-09-24 DIAGNOSIS — R51 Headache: Secondary | ICD-10-CM | POA: Insufficient documentation

## 2013-09-24 DIAGNOSIS — Z3202 Encounter for pregnancy test, result negative: Secondary | ICD-10-CM | POA: Insufficient documentation

## 2013-09-24 DIAGNOSIS — Z8632 Personal history of gestational diabetes: Secondary | ICD-10-CM | POA: Insufficient documentation

## 2013-09-24 DIAGNOSIS — R69 Illness, unspecified: Secondary | ICD-10-CM

## 2013-09-24 DIAGNOSIS — R062 Wheezing: Secondary | ICD-10-CM | POA: Insufficient documentation

## 2013-09-24 DIAGNOSIS — R5383 Other fatigue: Secondary | ICD-10-CM

## 2013-09-24 DIAGNOSIS — IMO0001 Reserved for inherently not codable concepts without codable children: Secondary | ICD-10-CM | POA: Insufficient documentation

## 2013-09-24 DIAGNOSIS — J111 Influenza due to unidentified influenza virus with other respiratory manifestations: Secondary | ICD-10-CM | POA: Diagnosis present

## 2013-09-24 DIAGNOSIS — R5381 Other malaise: Secondary | ICD-10-CM | POA: Insufficient documentation

## 2013-09-24 DIAGNOSIS — R059 Cough, unspecified: Secondary | ICD-10-CM | POA: Insufficient documentation

## 2013-09-24 DIAGNOSIS — Z87891 Personal history of nicotine dependence: Secondary | ICD-10-CM | POA: Insufficient documentation

## 2013-09-24 DIAGNOSIS — J3489 Other specified disorders of nose and nasal sinuses: Secondary | ICD-10-CM | POA: Insufficient documentation

## 2013-09-24 DIAGNOSIS — Z8742 Personal history of other diseases of the female genital tract: Secondary | ICD-10-CM | POA: Insufficient documentation

## 2013-09-24 DIAGNOSIS — Z79899 Other long term (current) drug therapy: Secondary | ICD-10-CM | POA: Insufficient documentation

## 2013-09-24 DIAGNOSIS — R05 Cough: Secondary | ICD-10-CM | POA: Insufficient documentation

## 2013-09-24 LAB — CBC
HEMATOCRIT: 39.4 % (ref 36.0–46.0)
HEMOGLOBIN: 13 g/dL (ref 12.0–15.0)
MCH: 29.1 pg (ref 26.0–34.0)
MCHC: 33 g/dL (ref 30.0–36.0)
MCV: 88.3 fL (ref 78.0–100.0)
Platelets: 232 10*3/uL (ref 150–400)
RBC: 4.46 MIL/uL (ref 3.87–5.11)
RDW: 13.9 % (ref 11.5–15.5)
WBC: 5.2 10*3/uL (ref 4.0–10.5)

## 2013-09-24 LAB — BASIC METABOLIC PANEL
Anion gap: 14 (ref 5–15)
BUN: 8 mg/dL (ref 6–23)
CALCIUM: 9.1 mg/dL (ref 8.4–10.5)
CO2: 24 mEq/L (ref 19–32)
Chloride: 98 mEq/L (ref 96–112)
Creatinine, Ser: 0.94 mg/dL (ref 0.50–1.10)
GFR calc Af Amer: >90 mL/min (ref >90)
GFR, EST NON AFRICAN AMERICAN: 78 mL/min — AB (ref >90)
Glucose, Bld: 89 mg/dL (ref 70–99)
POTASSIUM: 4.4 meq/L (ref 3.7–5.3)
Sodium: 136 mEq/L — ABNORMAL LOW (ref 137–147)

## 2013-09-24 LAB — POC URINE PREG, ED: Preg Test, Ur: NEGATIVE

## 2013-09-24 MED ORDER — DEXAMETHASONE SODIUM PHOSPHATE 10 MG/ML IJ SOLN
10.0000 mg | Freq: Once | INTRAMUSCULAR | Status: AC
  Administered 2013-09-24: 10 mg via INTRAVENOUS
  Filled 2013-09-24: qty 1

## 2013-09-24 MED ORDER — IPRATROPIUM-ALBUTEROL 0.5-2.5 (3) MG/3ML IN SOLN
3.0000 mL | Freq: Once | RESPIRATORY_TRACT | Status: AC
  Administered 2013-09-24: 3 mL via RESPIRATORY_TRACT
  Filled 2013-09-24: qty 3

## 2013-09-24 MED ORDER — PROCHLORPERAZINE EDISYLATE 5 MG/ML IJ SOLN
10.0000 mg | Freq: Four times a day (QID) | INTRAMUSCULAR | Status: DC | PRN
  Administered 2013-09-24: 10 mg via INTRAVENOUS
  Filled 2013-09-24: qty 2

## 2013-09-24 MED ORDER — SODIUM CHLORIDE 0.9 % IV BOLUS (SEPSIS)
1000.0000 mL | Freq: Once | INTRAVENOUS | Status: AC
  Administered 2013-09-24: 1000 mL via INTRAVENOUS

## 2013-09-24 MED ORDER — DIPHENHYDRAMINE HCL 50 MG/ML IJ SOLN
25.0000 mg | Freq: Once | INTRAMUSCULAR | Status: AC
  Administered 2013-09-24: 25 mg via INTRAVENOUS
  Filled 2013-09-24: qty 1

## 2013-09-24 NOTE — ED Provider Notes (Signed)
CSN: [id number]     Arrival date & time 09/24/13  1401 History   First MD Initiated Contact with Patient 09/24/13 1728     Chief Complaint  Patient presents with  . Fatigue  . Generalized Body Aches     (Consider location/radiation/quality/duration/timing/severity/associated sxs/prior Treatment) HPI Mardelle Matte 33 y.o. present with headache, myalgias, cough, nasal congestion, and wheezing. She has a remote history of wheezing that she has been treated with albuterol in the past, but no formal diagnosis of asthma. She is not currently a smoker. She reports all of her symptoms started in the past 3 days. Headache was of gradual onset. The headache is generalized, worsened with light, improved when she shuts her eyes. Has tried Tylenol with little improvement. No neck pain, fever, tick exposures, sick contacts, rashes, vomiting, abdominal pain, chest pain, or diarrhea. She is also have nasal congestion with green nasal dc. She reports some wheezing as well, but denies shortness of breath. She tried her albuterol this morning with little help. She also has a wet cough that is not productive. Not currently on any estrogen. No long care rides or airplane rides. No personal or family history of VET.   Past Medical History  Diagnosis Date  . Pregnancy induced hypertension   . Bilateral ovarian cysts   . Headache(784.0)   . Gestational diabetes    No past surgical history on file. Family History  Problem Relation Age of Onset  . Anesthesia problems Neg Hx   . Hypotension Neg Hx   . Malignant hyperthermia Neg Hx   . Pseudochol deficiency Neg Hx   . Diabetes Mother   . Hypertension Mother   . Diabetes Maternal Grandmother   . Hyperlipidemia Maternal Grandmother    History  Substance Use Topics  . Smoking status: Former Smoker    Types: Cigarettes  . Smokeless tobacco: Never Used  . Alcohol Use: No     Comment: socially-not with pregnancy   OB History   Grav Para Term Preterm  Abortions TAB SAB Ect Mult Living   2 2 2  0      2     Review of Systems  All other systems reviewed and are negative.     Allergies  Orange  Home Medications   Prior to Admission medications   Medication Sig Start Date End Date Taking? Authorizing Provider  acetaminophen (TYLENOL) 500 MG tablet Take 1,000 mg by mouth every 6 (six) hours as needed for moderate pain.   Yes Historical Provider, MD  albuterol (PROVENTIL HFA;VENTOLIN HFA) 108 (90 BASE) MCG/ACT inhaler Inhale 2 puffs into the lungs every 4 (four) hours as needed for wheezing or shortness of breath. 12/23/12  Yes Margarita Mail, PA-C  dextromethorphan (DELSYM) 30 MG/5ML liquid Take 60 mg by mouth as needed for cough.   Yes Historical Provider, MD   BP 158/98  Pulse 85  Temp(Src) 98.6 F (37 C) (Oral)  Resp 19  Wt 227 lb (102.967 kg)  SpO2 98%  LMP 09/19/2013 Physical Exam  Constitutional: She is oriented to person, place, and time. She appears well-developed and well-nourished. No distress.  Wincing to light  HENT:  Head: Normocephalic and atraumatic.  Right Ear: External ear normal.  Left Ear: External ear normal.  Nose: Mucosal edema and rhinorrhea present. Right sinus exhibits no maxillary sinus tenderness and no frontal sinus tenderness. Left sinus exhibits no maxillary sinus tenderness and no frontal sinus tenderness.  Eyes: EOM are normal. Right eye exhibits no discharge.  Left eye exhibits no discharge. Right conjunctiva is injected. Right conjunctiva has no hemorrhage. Left conjunctiva is injected. Left conjunctiva has no hemorrhage.  Neck: Normal range of motion. Neck supple. No JVD present.  Cardiovascular: Normal rate, regular rhythm and normal heart sounds.  Exam reveals no gallop and no friction rub.   No murmur heard. Pulmonary/Chest: Effort normal and breath sounds normal. No stridor. No respiratory distress. She has no wheezes. She has no rales. She exhibits no tenderness.  Abdominal: Soft. Bowel  sounds are normal. She exhibits no distension. There is no tenderness. There is no rebound and no guarding.  Musculoskeletal: Normal range of motion. She exhibits no edema.  Neurological: She is alert and oriented to person, place, and time. No cranial nerve deficit (3-12 grossly intact bilaterally). GCS eye subscore is 4. GCS verbal subscore is 5. GCS motor subscore is 6.  Strength in flexion and extension at the shoulders, elbows, wrists, hips, knee, and ankle 5/5 bilaterally EHL 5/5 bilaterally Grip strength 5/5 bilaterally Finger to nose, heel toe shin and rapid alternating movements intact bilaterally. Sensation over the dorsum of the hand over the 1st, second and 5th metacarpal, and the lateral forearm and lateral shoulder intact bilaterally.  Sensation over the medial and lateral malleolus, and over the 1st metatarsal intact bilaterally.  Skin: Skin is warm. No rash noted. She is not diaphoretic.  Psychiatric: She has a normal mood and affect. Her behavior is normal.    ED Course  Procedures (including critical care time) Labs Review Labs Reviewed  BASIC METABOLIC PANEL - Abnormal; Notable for the following:    Sodium 136 (*)    GFR calc non Af Amer 78 (*)    All other components within normal limits  CBC  POC URINE PREG, ED    Imaging Review No results found.   EKG Interpretation   Date/Time:  Friday September 24 2013 14:32:36 EDT Ventricular Rate:  81 PR Interval:  156 QRS Duration: 92 QT Interval:  386 QTC Calculation: 448 R Axis:   68 Text Interpretation:  Normal sinus rhythm Normal ECG No significant change  since last tracing Confirmed by ALLEN  MD, ANTHONY ([id number]) on 09/24/2013  7:25:34 PM      MDM   Final diagnoses:  Wheezing  Influenza-like illness    Pt presents with headache and flu like symptoms. No fever. AFVSS. HDS. NAD. No meningismus. No neurologic deficits on exam. Expiratory wheezing bilaterally. No increased work of breathing. No red flags  regarding the HA. Gave migraine cocktail (compazine, benadryl, and decadron) with resolution of her HA. Doubt leptomeningeal infection. Doubt SAH. Wheezing resolved with one DuoNeb. Has albuterol at home. 4 puffs every 6 hours for the next 2 day, which she can do. FU with PCP for possible work up for possibly asthma. Strong return precautions given for worsening symptoms or any other alarming or concerning symptoms or issues. The patient was in agreement with the treatment plan and I answered all of their questions. The patient was stable for dc. At dc, the patient ambulated without difficulty, was moving all four extremities, symptoms improved, NAD. and AOx4 Care discussed with my attending, Dr. Zenia Resides.. If performed and available, imaging studies and labs reviewed.     Kelby Aline, MD 09/24/13 2006

## 2013-09-24 NOTE — ED Provider Notes (Signed)
I saw and evaluated the patient, reviewed the resident's note and I agree with the findings and plan.   EKG Interpretation None     Patient here complaining of bodyaches myalgias without vomiting or diarrhea. Some wheezing associated with cough and congestion. Patient likely bronchitis we'll be treated for same  Leota Jacobsen, MD 09/24/13 1925

## 2013-09-24 NOTE — ED Notes (Signed)
Pt reports bodyaches, headache, fatigue, pt states unable to eat or drink. Pt currently awake, alert, oriented x4, NAd at present. Pt reports chest tightness. Used inhaler twice this AM with no relief. Pt with diminished lung sounds, faint exp wheeze in bases.

## 2013-09-26 NOTE — ED Provider Notes (Signed)
I saw and evaluated the patient, reviewed the resident's note and I agree with the findings and plan.   EKG Interpretation   Date/Time:  Friday September 24 2013 14:32:36 EDT Ventricular Rate:  81 PR Interval:  156 QRS Duration: 92 QT Interval:  386 QTC Calculation: 448 R Axis:   68 Text Interpretation:  Normal sinus rhythm Normal ECG No significant change  since last tracing Confirmed by Zenia Resides  MD, Ridhi Hoffert ([id number]) on 09/24/2013  7:25:34 PM       Leota Jacobsen, MD 09/26/13 [phone]

## 2013-11-22 ENCOUNTER — Encounter (HOSPITAL_COMMUNITY): Payer: Self-pay | Admitting: Emergency Medicine

## 2014-01-21 DIAGNOSIS — I10 Essential (primary) hypertension: Secondary | ICD-10-CM

## 2014-01-21 HISTORY — DX: Essential (primary) hypertension: I10

## 2014-04-19 IMAGING — US US OB COMP +14 WK
1 series · 12 of 28 positions shown · non-contrast
Comparison: none

[Series 1: us ob comp +14 wk · 0.29mm/px · 58 acquisitions, 12 frames shown]
[im 3/58]
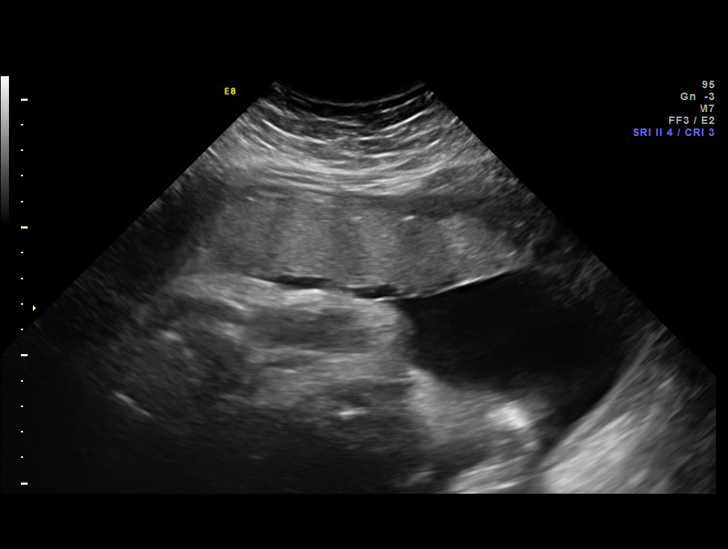
[im 7/58]
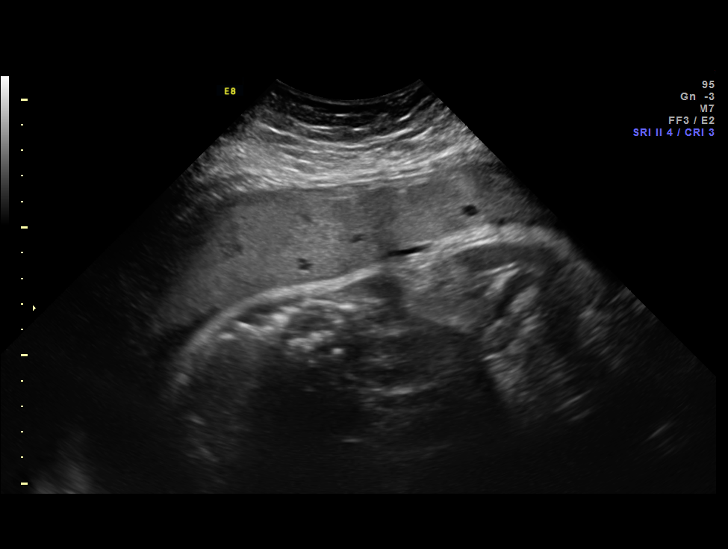
[im 11/58]
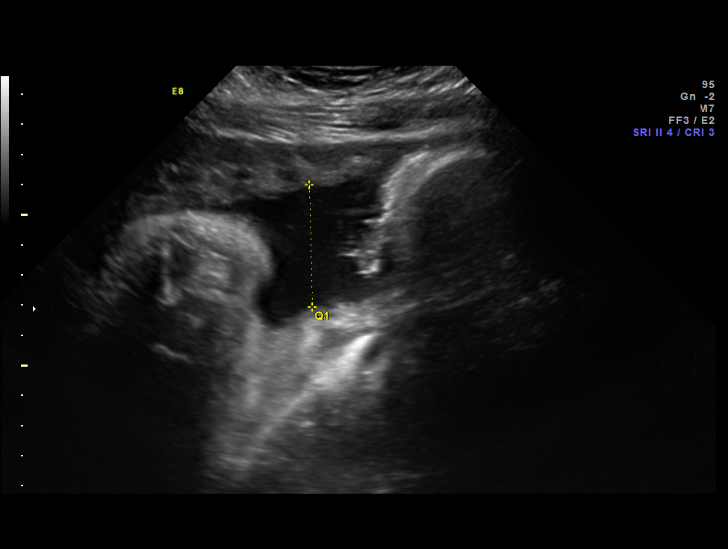
[im 17/58]
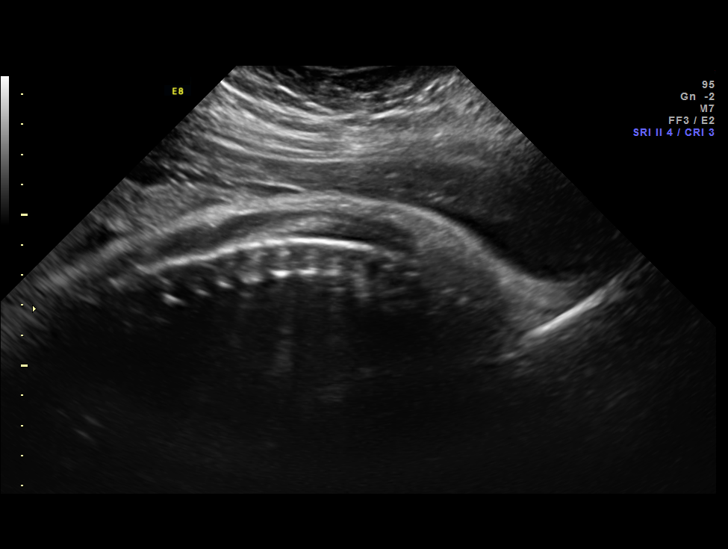
[im 22/58]
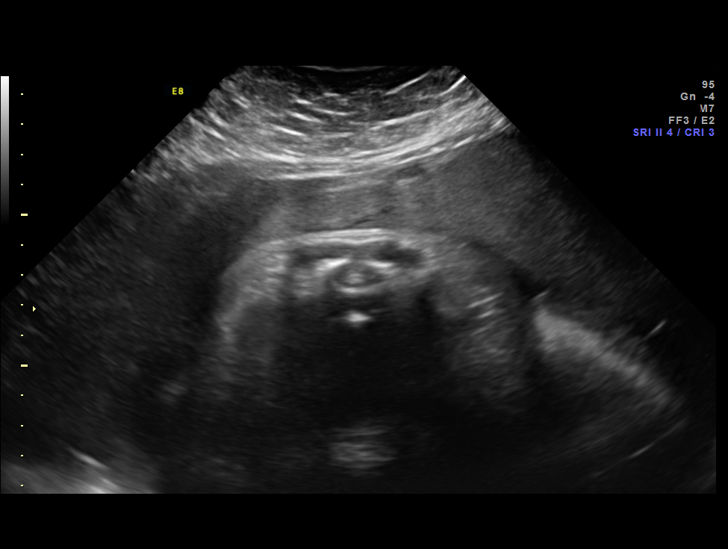
[im 26/58]
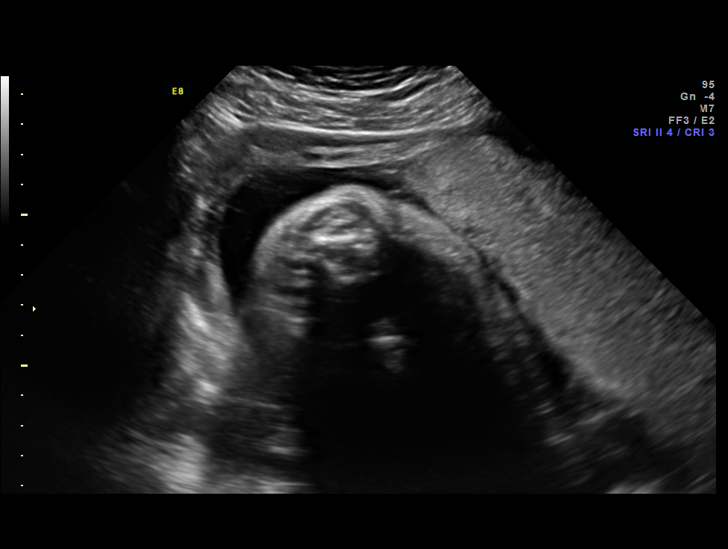
[im 32/58]
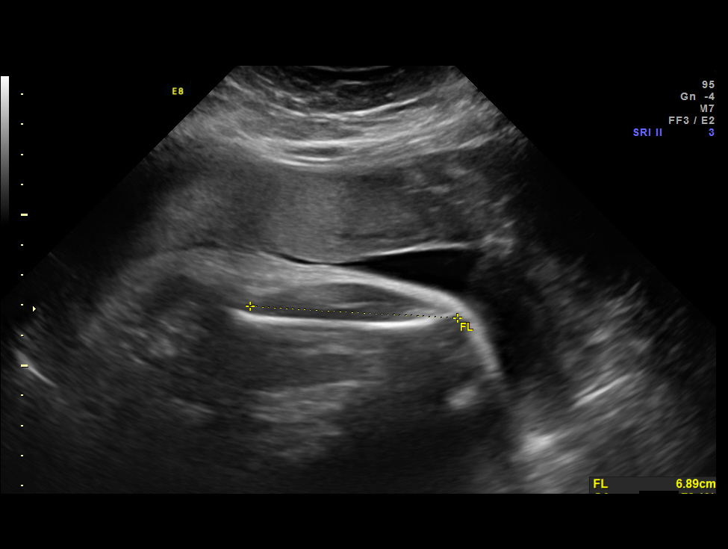
[im 36/58]
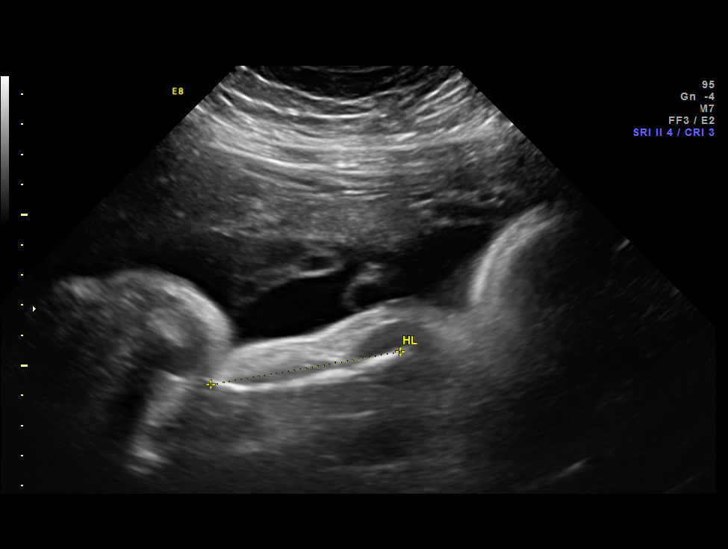
[im 41/58]
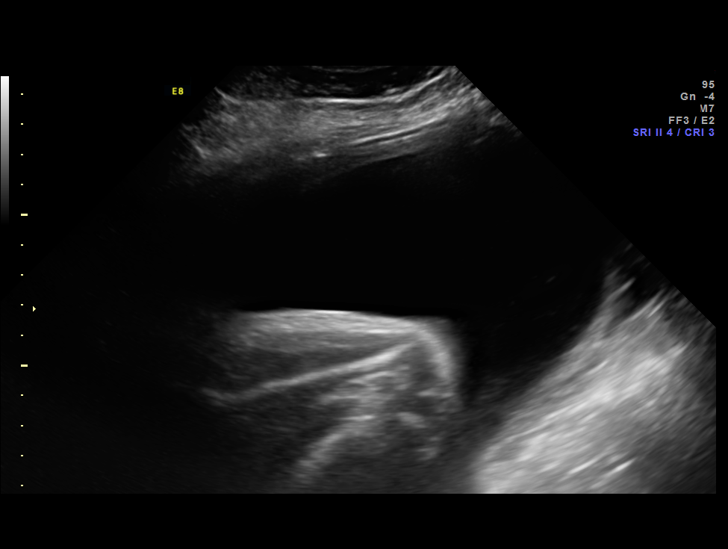
[im 47/58]
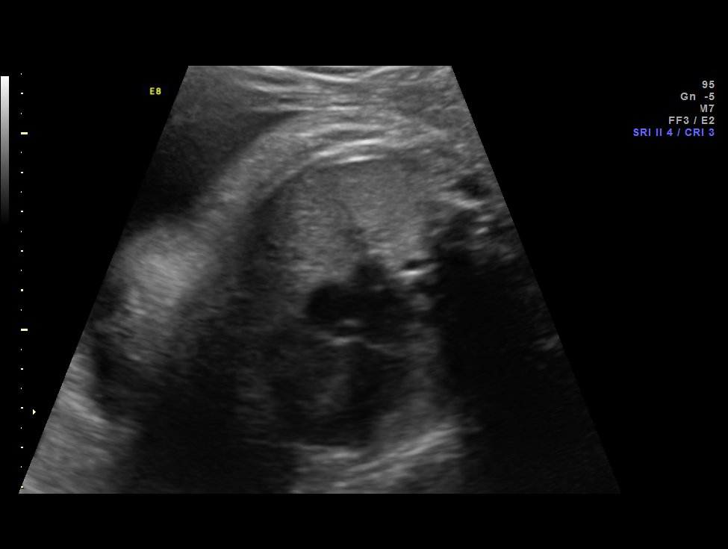
[im 51/58]
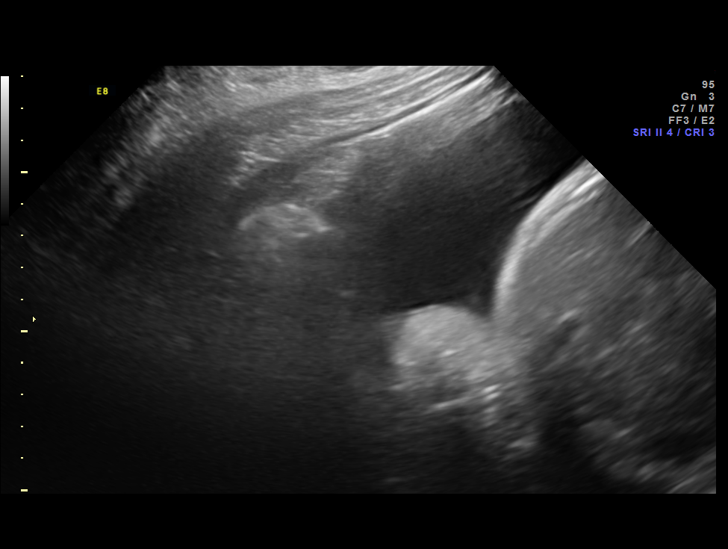
[im 55/58]
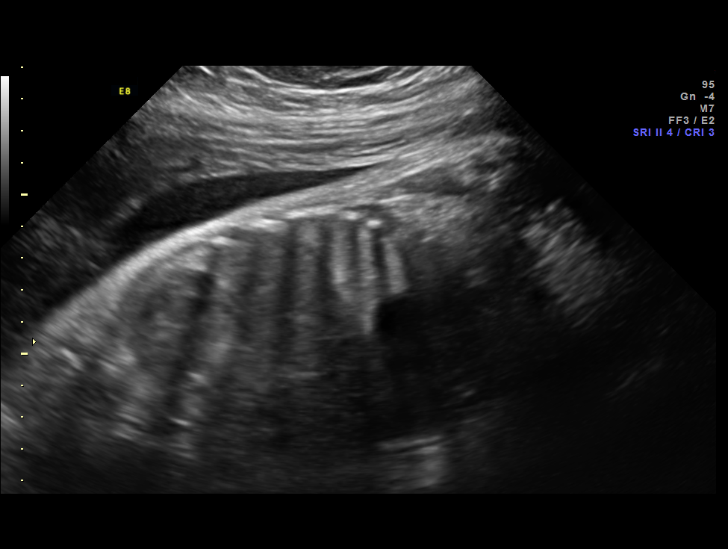

[12 of 28 positions shown; findings below may reference images not displayed]

OBSTETRICS REPORT
                      (Signed Final 09/20/2011 [DATE])

 Order#:         [id number]
Procedures

 US OB COMP + 14 WK                                    76805.1
Indications

 Diabetes - Gestational, A1
 Increased BMI   262 lbs
 Basic anatomic survey
 Assess Fetal Growth / Estimated Fetal Weight
Fetal Evaluation

 Fetal Heart Rate:  136                          bpm
 Cardiac Activity:  Observed
 Presentation:      Cephalic
 Placenta:          Anterior, above cervical os
 P. Cord            Not well visualized
 Insertion:

 Amniotic Fluid
 AFI FV:      Subjectively upper-normal
 AFI Sum:     19.87   cm       74  %Tile
Biometry

 BPD:     84.7  mm     G. Age:  34w 1d                CI:        75.89   70 - 86
                                                      FL/HC:      22.7   19.9 -

 HC:     308.2  mm     G. Age:  34w 3d       38  %    HC/AC:      0.94   0.96 -

 AC:     328.8  mm     G. Age:  36w 5d     > 97  %    FL/BPD:     82.8   71 - 87
 FL:      70.1  mm     G. Age:  36w 0d       92  %    FL/AC:      21.3   20 - 24
 HUM:     63.8  mm     G. Age:  37w 0d     > 95  %
 CER:     42.1  mm     G. Age:  36w 1d       80  %

 Est. FW:    6263  gm      6 lb 4 oz   > 90  %
Gestational Age

 LMP:           33w 3d        Date:  01/29/11                 EDD:   11/05/11
 U/S Today:     35w 2d                                        EDD:   10/23/11
 Best:          33w 3d     Det. By:  LMP  (01/29/11)          EDD:   11/05/11
Anatomy

 Cranium:           Appears normal      Aortic Arch:       Not well
                                                           visualized
 Fetal Cavum:       Not well            Ductal Arch:       Not well
                    visualized                             visualized
 Ventricles:        Appears normal      Diaphragm:         Appears normal
 Choroid Plexus:    Not well            Stomach:           Appears normal
                    visualized
 Cerebellum:        Appears normal      Abdomen:           Appears normal
 Posterior Fossa:   Appears normal      Abdominal Wall:    Not well
                                                           visualized
 Nuchal Fold:       Not applicable      Cord Vessels:      Appears normal
                    (>20 wks GA)                           (3 vessel cord)
 Face:              Not well            Kidneys:           Appear normal
                    visualized
 Heart:             Appears normal      Bladder:           Appears normal
                    (4 chamber &
                    axis)
 RVOT:              Not well            Spine:             Appears normal
                    visualized
 LVOT:              Not well            Limbs:             Four extremities
                    visualized                             seen

 Other:     Technically difficult due to  maternal habitus. Technically
            difficult due to advanced GA and fetal position.
Cervix Uterus Adnexa

 Cervix:       Not evaluated.
 Left Ovary:    Not visualized.
 Right Ovary:   Not visualized.
Impression

 Single IUP at 33 [DATE] weeks
 No fetal anomalies noted; limited views of the anatomy were
 obtained due to late gestational age
 The estimated fetal weight today is > 90th %tile
 Normal amniotic fluid volume
Recommendations

 Continue twice weekly NSTs with weekly AFIs
 Follow up growth scan in 3 weeks

 questions or concerns.

## 2014-10-16 ENCOUNTER — Encounter (HOSPITAL_COMMUNITY): Payer: Self-pay

## 2014-10-16 DIAGNOSIS — Z3202 Encounter for pregnancy test, result negative: Secondary | ICD-10-CM | POA: Diagnosis not present

## 2014-10-16 DIAGNOSIS — R1110 Vomiting, unspecified: Secondary | ICD-10-CM | POA: Diagnosis present

## 2014-10-16 DIAGNOSIS — R1011 Right upper quadrant pain: Secondary | ICD-10-CM | POA: Diagnosis not present

## 2014-10-16 DIAGNOSIS — Z8632 Personal history of gestational diabetes: Secondary | ICD-10-CM | POA: Insufficient documentation

## 2014-10-16 DIAGNOSIS — Z87891 Personal history of nicotine dependence: Secondary | ICD-10-CM | POA: Insufficient documentation

## 2014-10-16 DIAGNOSIS — Z79899 Other long term (current) drug therapy: Secondary | ICD-10-CM | POA: Diagnosis not present

## 2014-10-16 NOTE — ED Notes (Signed)
Pt reports around 3am she woke up vomiting, diarrhea, diaphoresis, body aches, fever of 101.4 earlier today and she took tylenol and nyquil but she just feels worse.

## 2014-10-17 ENCOUNTER — Emergency Department (HOSPITAL_COMMUNITY)
Admission: EM | Admit: 2014-10-17 | Discharge: 2014-10-17 | Disposition: A | Discharge status: Home / Self Care | Payer: No Typology Code available for payment source | Attending: Emergency Medicine | Admitting: Emergency Medicine

## 2014-10-17 ENCOUNTER — Emergency Department (HOSPITAL_COMMUNITY): Payer: No Typology Code available for payment source

## 2014-10-17 DIAGNOSIS — R1011 Right upper quadrant pain: Secondary | ICD-10-CM

## 2014-10-17 LAB — URINALYSIS, ROUTINE W REFLEX MICROSCOPIC
Glucose, UA: NEGATIVE mg/dL
KETONES UR: 15 mg/dL — AB
Leukocytes, UA: NEGATIVE
NITRITE: NEGATIVE
PH: 5.5 (ref 5.0–8.0)
Protein, ur: NEGATIVE mg/dL
Specific Gravity, Urine: 1.03 (ref 1.005–1.030)
Urobilinogen, UA: 1 mg/dL (ref 0.0–1.0)

## 2014-10-17 LAB — LIPASE, BLOOD: Lipase: 25 U/L (ref 22–51)

## 2014-10-17 LAB — COMPREHENSIVE METABOLIC PANEL
ALBUMIN: 3.8 g/dL (ref 3.5–5.0)
ALK PHOS: 109 U/L (ref 38–126)
ALT: 101 U/L — ABNORMAL HIGH (ref 14–54)
AST: 175 U/L — AB (ref 15–41)
Anion gap: 7 (ref 5–15)
BUN: 6 mg/dL (ref 6–20)
CO2: 24 mmol/L (ref 22–32)
Calcium: 9.3 mg/dL (ref 8.9–10.3)
Chloride: 106 mmol/L (ref 101–111)
Creatinine, Ser: 0.89 mg/dL (ref 0.44–1.00)
GFR calc Af Amer: >60 mL/min (ref >60)
GFR calc non Af Amer: >60 mL/min (ref >60)
GLUCOSE: 127 mg/dL — AB (ref 65–99)
POTASSIUM: 4 mmol/L (ref 3.5–5.1)
Sodium: 137 mmol/L (ref 135–145)
Total Bilirubin: 0.8 mg/dL (ref 0.3–1.2)
Total Protein: 7.1 g/dL (ref 6.5–8.1)

## 2014-10-17 LAB — CBC
HEMATOCRIT: 36.3 % (ref 36.0–46.0)
Hemoglobin: 11.9 g/dL — ABNORMAL LOW (ref 12.0–15.0)
MCH: 29.5 pg (ref 26.0–34.0)
MCHC: 32.8 g/dL (ref 30.0–36.0)
MCV: 89.9 fL (ref 78.0–100.0)
Platelets: 262 10*3/uL (ref 150–400)
RBC: 4.04 MIL/uL (ref 3.87–5.11)
RDW: 14.4 % (ref 11.5–15.5)
WBC: 13.6 10*3/uL — ABNORMAL HIGH (ref 4.0–10.5)

## 2014-10-17 LAB — POC URINE PREG, ED: Preg Test, Ur: NEGATIVE

## 2014-10-17 LAB — URINE MICROSCOPIC-ADD ON

## 2014-10-17 MED ORDER — ONDANSETRON 4 MG PO TBDP
4.0000 mg | ORAL_TABLET | Freq: Once | ORAL | Status: AC
Start: 2014-10-17 — End: 2014-10-17
  Administered 2014-10-17: 4 mg via ORAL
  Filled 2014-10-17: qty 1

## 2014-10-17 MED ORDER — ONDANSETRON 4 MG PO TBDP: 4.0000 mg | ORAL_TABLET | Freq: Three times a day (TID) | ORAL | Status: DC | PRN

## 2014-10-17 MED ORDER — KETOROLAC TROMETHAMINE 60 MG/2ML IM SOLN
60.0000 mg | Freq: Once | INTRAMUSCULAR | Status: AC
  Administered 2014-10-17: 60 mg via INTRAMUSCULAR
  Filled 2014-10-17: qty 2

## 2014-10-17 NOTE — Discharge Instructions (Signed)
Nausea and Vomiting Rebecca Spence, your blood work and ultrasound today were normal. Continue to take ibuprofen 800 mg every 8 hours as needed for your pain. Take Zofran as needed for vomiting. See a primary care physician within 3 days for close follow-up. If symptoms worsen come back to the emergency department immediately. Thank you. Nausea means you feel sick to your stomach. Throwing up (vomiting) is a reflex where stomach contents come out of your mouth. HOME CARE   Take medicine as told by your doctor.  Do not force yourself to eat. However, you do need to drink fluids.  If you feel like eating, eat a normal diet as told by your doctor.  Eat rice, wheat, potatoes, bread, lean meats, yogurt, fruits, and vegetables.  Avoid high-fat foods.  Drink enough fluids to keep your pee (urine) clear or pale yellow.  Ask your doctor how to replace body fluid losses (rehydrate). Signs of body fluid loss (dehydration) include:  Feeling very thirsty.  Dry lips and mouth.  Feeling dizzy.  Dark pee.  Peeing less than normal.  Feeling confused.  Fast breathing or heart rate. GET HELP RIGHT AWAY IF:   You have blood in your throw up.  You have black or bloody poop (stool).  You have a bad headache or stiff neck.  You feel confused.  You have bad belly (abdominal) pain.  You have chest pain or trouble breathing.  You do not pee at least once every 8 hours.  You have cold, clammy skin.  You keep throwing up after 24 to 48 hours.  You have a fever. MAKE SURE YOU:   Understand these instructions.  Will watch your condition.  Will get help right away if you are not doing well or get worse. Document Released: 06/26/2007 Document Revised: 04/01/2011 Document Reviewed: 06/08/2010 [facility] Patient Information 2015 [place], [place]. This information is not intended to replace advice given to you by your health care provider. Make sure you discuss any questions you have with your  health care provider. Influenza Influenza (flu) is an infection in the mouth, nose, and throat (respiratory tract) caused by a virus. The flu can make you feel very ill. Influenza spreads easily from person to person (contagious).  HOME CARE   Only take medicines as told by your doctor.  Use a cool mist humidifier to make breathing easier.  Get plenty of rest until your fever goes away. This usually takes 3 to 4 days.  Drink enough fluids to keep your pee (urine) clear or pale yellow.  Cover your mouth and nose when you cough or sneeze.  Wash your hands well to avoid spreading the flu.  Stay home from work or school until your fever has been gone for at least 1 full day.  Get a flu shot every year. GET HELP RIGHT AWAY IF:   You have trouble breathing or feel short of breath.  Your skin or nails turn blue.  You have severe neck pain or stiffness.  You have a severe headache, facial pain, or earache.  Your fever gets worse or keeps coming back.  You feel sick to your stomach (nauseous), throw up (vomit), or have watery poop (diarrhea).  You have chest pain.  You have a deep cough that gets worse, or you cough up more thick spit (mucus). MAKE SURE YOU:   Understand these instructions.  Will watch your condition.  Will get help right away if you are not doing well or get worse. Document  Released: 10/17/2007 Document Revised: 05/24/2013 Document Reviewed: 04/08/2011 [facility] Patient Information 2015 [place], [place]. This information is not intended to replace advice given to you by your health care provider. Make sure you discuss any questions you have with your health care provider.

## 2014-10-17 NOTE — ED Provider Notes (Signed)
CSN: [id number]     Arrival date & time 10/16/14  2333 History   First MD Initiated Contact with Patient 10/17/14 [phone]     Chief Complaint  Patient presents with  . Emesis     (Consider location/radiation/quality/duration/timing/severity/associated sxs/prior Treatment) HPI  Rebecca Spence is a 34 y.o. female with no significant past medical history presenting today with multiple complaints. She states she woke up approximately 24 hours ago with vomiting, diarrhea, severe body aches, and fever to 101.4. She took Tylenol and NyQuil but states she continues to have the symptoms. She does have sick contacts and her children who recently had influenza. She states her worst symptom is the body aches. She denies any coughing during the interval. She denies any pain anywhere. Patient has no further complaints.  10 Systems reviewed and are negative for acute change except as noted in the HPI.     Past Medical History  Diagnosis Date  . Pregnancy induced hypertension   . Bilateral ovarian cysts   . Headache(784.0)   . Gestational diabetes    History reviewed. No pertinent past surgical history. Family History  Problem Relation Age of Onset  . Anesthesia problems Neg Hx   . Hypotension Neg Hx   . Malignant hyperthermia Neg Hx   . Pseudochol deficiency Neg Hx   . Diabetes Mother   . Hypertension Mother   . Diabetes Maternal Grandmother   . Hyperlipidemia Maternal Grandmother    Social History  Substance Use Topics  . Smoking status: Former Smoker    Types: Cigarettes  . Smokeless tobacco: Never Used  . Alcohol Use: No     Comment: socially-not with pregnancy   OB History    Gravida Para Term Preterm AB TAB SAB Ectopic Multiple Living   2 2 2  0      2     Review of Systems    Allergies  Orange  Home Medications   Prior to Admission medications   Medication Sig Start Date End Date Taking? Authorizing Payslee Bateson  acetaminophen (TYLENOL) 500 MG tablet Take 1,000 mg by  mouth every 6 (six) hours as needed for moderate pain.    Historical Quincey Quesinberry, MD  albuterol (PROVENTIL HFA;VENTOLIN HFA) 108 (90 BASE) MCG/ACT inhaler Inhale 2 puffs into the lungs every 4 (four) hours as needed for wheezing or shortness of breath. 12/23/12   Margarita Mail, PA-C  dextromethorphan (DELSYM) 30 MG/5ML liquid Take 60 mg by mouth as needed for cough.    Historical Aamilah Augenstein, MD   BP 155/103 mmHg  Pulse 77  Temp(Src) 98.1 F (36.7 C) (Oral)  Resp 18  Ht 5\' 7"  (1.702 m)  Wt 209 lb (94.802 kg)  BMI 32.73 kg/m2  SpO2 100%  LMP 10/15/2014 Physical Exam  Constitutional: She is oriented to person, place, and time. She appears well-developed and well-nourished. No distress.  HENT:  Head: Normocephalic and atraumatic.  Nose: Nose normal.  Mouth/Throat: Oropharynx is clear and moist. No oropharyngeal exudate.  Eyes: Conjunctivae and EOM are normal. Pupils are equal, round, and reactive to light. No scleral icterus.  Neck: Normal range of motion. Neck supple. No JVD present. No tracheal deviation present. No thyromegaly present.  Cardiovascular: Normal rate, regular rhythm and normal heart sounds.  Exam reveals no gallop and no friction rub.   No murmur heard. Pulmonary/Chest: Effort normal and breath sounds normal. No respiratory distress. She has no wheezes. She exhibits no tenderness.  Abdominal: Soft. Bowel sounds are normal. She exhibits no distension  and no mass. There is no tenderness. There is no rebound and no guarding.  Musculoskeletal: Normal range of motion. She exhibits no edema or tenderness.  Lymphadenopathy:    She has no cervical adenopathy.  Neurological: She is alert and oriented to person, place, and time. No cranial nerve deficit. She exhibits normal muscle tone.  Skin: Skin is warm and dry. No rash noted. No erythema. No pallor.  Nursing note and vitals reviewed.   ED Course  Procedures (including critical care time) Labs Review Labs Reviewed   COMPREHENSIVE METABOLIC PANEL - Abnormal; Notable for the following:    Glucose, Bld 127 (*)    AST 175 (*)    ALT 101 (*)    All other components within normal limits  CBC - Abnormal; Notable for the following:    WBC 13.6 (*)    Hemoglobin 11.9 (*)    All other components within normal limits  URINALYSIS, ROUTINE W REFLEX MICROSCOPIC (NOT AT [facility]) - Abnormal; Notable for the following:    Color, Urine AMBER (*)    APPearance CLOUDY (*)    Hgb urine dipstick MODERATE (*)    Bilirubin Urine SMALL (*)    Ketones, ur 15 (*)    All other components within normal limits  URINE MICROSCOPIC-ADD ON - Abnormal; Notable for the following:    Squamous Epithelial / LPF FEW (*)    All other components within normal limits  LIPASE, BLOOD  POC URINE PREG, ED    Imaging Review US Abdomen Limited Ruq  10/17/2014   CLINICAL DATA:  Right upper quadrant pain  EXAM: US ABDOMEN LIMITED - RIGHT UPPER QUADRANT  COMPARISON:  None.  FINDINGS: Gallbladder:  Partially distended with no wall thickening or visible stone.  Common bile duct:  Diameter: 3 mm.  Where visualized, no filling defect.  Liver:  No focal lesion identified. Within normal limits in parenchymal echogenicity. Antegrade flow in the imaged portal venous system.  IMPRESSION: Negative right upper quadrant ultrasound.   Electronically Signed   By: Monte Fantasia M.D.   On: 10/17/2014 02:15   I have personally reviewed and evaluated these images and lab results as part of my medical decision-making.   EKG Interpretation None      MDM   Final diagnoses:  RUQ pain    Patient presents to the emergency department for multiple complaints. She tells me her worst complaint is the body aches.  She does have a leukocytosis which is nonspecific. she does have elevated LFTs. Will obtain right upper quadrant ultrasound to assure there is no gallbladder pathology. Repeat temperature done by myself as 98.1. Patient was given Toradol and Zofran for her  symptoms. Ultrasound does not reveal any acute abnormalities in the right upper quadrant.  Patient will be advised to continue ibuprofen and Zofran as needed for this likely viral illness. Primary care follow-up was advised. She appears well in no acute distress, her vital signs remain within her normal limits and she is safe for discharge.   Everlene Balls, MD 10/17/14 [phone]

## 2014-11-17 DIAGNOSIS — Z23 Encounter for immunization: Secondary | ICD-10-CM

## 2014-11-17 DIAGNOSIS — I10 Essential (primary) hypertension: Secondary | ICD-10-CM

## 2014-11-26 NOTE — Congregational Nurse Program (Signed)
Congregational Nurse Program Note  Date of Encounter: 11/17/2014  Past Medical History: Past Medical History  Diagnosis Date  . Pregnancy induced hypertension   . Bilateral ovarian cysts   . Headache(784.0)   . Gestational diabetes     Encounter Details:     CNP Questionnaire - 11/17/14 0035    Patient Demographics   Is this a new or existing patient? New   Patient is considered a/an Not Applicable   Patient Assistance   Patient's financial/insurance status Low Income;Uninsured   Patient referred to apply for the following financial assistance Not Applicable   Food insecurities addressed Not Applicable   Transportation assistance No   Assistance securing medications No   Educational health offerings Chronic disease;Hypertension;Health literacy   Encounter Details   Primary purpose of visit Other (comment);Chronic Illness/Condition Visit;Navigating the Healthcare System  flu vaccine   Was an Emergency Department visit averted? No   Does patient have a medical provider? No   Patient referred to Not Applicable   Was a mental health screening completed? (GAINS tool) No   Does patient have dental issues? No   Since previous encounter, have you referred patient for abnormal blood pressure that resulted in a new diagnosis or medication change? No   Since previous encounter, have you referred patient for abnormal blood glucose that resulted in a new diagnosis or medication change? No     Client seen at [facility].  Client given flu vaccine, see note.  Client has hx of hypertension.  BP today is 130/90,  Discussed reducing salt in diet.  Client has 2 children.,  Will followup with her on getting access to healthcare and medical history.

## 2015-01-25 ENCOUNTER — Emergency Department (HOSPITAL_COMMUNITY)
Admission: EM | Admit: 2015-01-25 | Discharge: 2015-01-25 | Disposition: A | Discharge status: Home / Self Care | Payer: BLUE CROSS/BLUE SHIELD | Attending: Emergency Medicine | Admitting: Emergency Medicine

## 2015-01-25 ENCOUNTER — Emergency Department (HOSPITAL_COMMUNITY): Payer: BLUE CROSS/BLUE SHIELD

## 2015-01-25 ENCOUNTER — Encounter (HOSPITAL_COMMUNITY): Payer: Self-pay | Admitting: *Deleted

## 2015-01-25 DIAGNOSIS — Z87891 Personal history of nicotine dependence: Secondary | ICD-10-CM | POA: Insufficient documentation

## 2015-01-25 DIAGNOSIS — Z8742 Personal history of other diseases of the female genital tract: Secondary | ICD-10-CM | POA: Diagnosis not present

## 2015-01-25 DIAGNOSIS — Z8632 Personal history of gestational diabetes: Secondary | ICD-10-CM | POA: Insufficient documentation

## 2015-01-25 DIAGNOSIS — R059 Cough, unspecified: Secondary | ICD-10-CM

## 2015-01-25 DIAGNOSIS — R05 Cough: Secondary | ICD-10-CM | POA: Diagnosis not present

## 2015-01-25 DIAGNOSIS — J45901 Unspecified asthma with (acute) exacerbation: Secondary | ICD-10-CM | POA: Diagnosis not present

## 2015-01-25 MED ORDER — PREDNISONE 20 MG PO TABS
60.0000 mg | ORAL_TABLET | Freq: Once | ORAL | Status: AC
  Administered 2015-01-25: 60 mg via ORAL
  Filled 2015-01-25: qty 3

## 2015-01-25 MED ORDER — PREDNISONE 20 MG PO TABS: 60.0000 mg | ORAL_TABLET | Freq: Every day | ORAL | Status: DC

## 2015-01-25 MED ORDER — AZITHROMYCIN 250 MG PO TABS: 250.0000 mg | ORAL_TABLET | Freq: Every day | ORAL | Status: DC

## 2015-01-25 MED ORDER — IPRATROPIUM-ALBUTEROL 0.5-2.5 (3) MG/3ML IN SOLN
3.0000 mL | Freq: Once | RESPIRATORY_TRACT | Status: AC
  Administered 2015-01-25: 3 mL via RESPIRATORY_TRACT
  Filled 2015-01-25: qty 3

## 2015-01-25 NOTE — ED Provider Notes (Signed)
CSN: [id number]     Arrival date & time 01/25/15  1930 History  By signing my name below, I, Starleen Arms, attest that this documentation has been prepared under the direction and in the presence of Wendie Simmer, PA-C. Electronically Signed: Starleen Arms ED Scribe. 01/25/2015. 8:08 PM.    Chief Complaint  Patient presents with  . Cough  . Facial Pain   The history is provided by the patient. No language interpreter was used.   HPI Comments: Rebecca Spence is a 35 y.o. female with hx of asthma (albuterol nebulizer/inhaler) who presents to the Emergency Department complaining of an unchanged productive cough onset 1 week ago.  Associated symptoms include wheezing, trouble sleeping due to cough.  The patient reports her mother and son have been ill with similar symptoms.  She has received the flu shot this year.  She denies fever, n/v/d, abdominal pain, sore throat, myalgias, decreased appetite, other complaints.    PCP: none  Past Medical History  Diagnosis Date  . Pregnancy induced hypertension   . Bilateral ovarian cysts   . Headache(784.0)   . Gestational diabetes    History reviewed. No pertinent past surgical history. Family History  Problem Relation Age of Onset  . Anesthesia problems Neg Hx   . Hypotension Neg Hx   . Malignant hyperthermia Neg Hx   . Pseudochol deficiency Neg Hx   . Diabetes Mother   . Hypertension Mother   . Diabetes Maternal Grandmother   . Hyperlipidemia Maternal Grandmother    Social History  Substance Use Topics  . Smoking status: Former Smoker    Types: Cigarettes  . Smokeless tobacco: Never Used  . Alcohol Use: No     Comment: socially-not with pregnancy   OB History    Gravida Para Term Preterm AB TAB SAB Ectopic Multiple Living   2 2 2  0      2     Review of Systems 10 Systems reviewed and all are negative for acute change except as noted in the HPI.   Allergies  Orange  Home Medications   Prior to Admission medications    Medication Sig Start Date End Date Taking? Authorizing Provider  acetaminophen (TYLENOL) 500 MG tablet Take 1,000 mg by mouth every 6 (six) hours as needed for moderate pain.    Historical Provider, MD  albuterol (PROVENTIL HFA;VENTOLIN HFA) 108 (90 BASE) MCG/ACT inhaler Inhale 2 puffs into the lungs every 4 (four) hours as needed for wheezing or shortness of breath. 12/23/12   Margarita Mail, PA-C  dextromethorphan (DELSYM) 30 MG/5ML liquid Take 60 mg by mouth as needed for cough.    Historical Provider, MD  ondansetron (ZOFRAN-ODT) 4 MG disintegrating tablet Take 1 tablet (4 mg total) by mouth every 8 (eight) hours as needed for nausea or vomiting. 10/17/14   Everlene Balls, MD   BP 161/99 mmHg  Pulse 94  Temp(Src) 98.3 F (36.8 C) (Oral)  Resp 18  SpO2 99%  LMP 01/02/2015 Physical Exam  Constitutional: She is oriented to person, place, and time. She appears well-developed and well-nourished. No distress.  HENT:  Head: Normocephalic and atraumatic.  Right Ear: External ear normal.  Left Ear: External ear normal.  Nose: Nose normal.  Mouth/Throat: Oropharynx is clear and moist. No oropharyngeal exudate.  Eyes: Conjunctivae and EOM are normal. Pupils are equal, round, and reactive to light. Right eye exhibits no discharge. Left eye exhibits no discharge. No scleral icterus.  Neck: Neck supple. No tracheal deviation present.  Cardiovascular: Normal rate, regular rhythm, normal heart sounds and intact distal pulses.  Exam reveals no gallop and no friction rub.   No murmur heard. Pulmonary/Chest: Effort normal. No respiratory distress. She has wheezes. She has no rales. She exhibits no tenderness.  Minimal end expiratory wheezes  Abdominal: Soft. Bowel sounds are normal. She exhibits no distension and no mass. There is no tenderness. There is no rebound and no guarding.  Musculoskeletal: Normal range of motion. She exhibits no edema.  Lymphadenopathy:    She has no cervical adenopathy.   Neurological: She is alert and oriented to person, place, and time. Coordination normal.  Skin: Skin is warm and dry. No rash noted. She is not diaphoretic. No erythema.  Psychiatric: She has a normal mood and affect. Her behavior is normal.  Nursing note and vitals reviewed.   ED Course  Procedures  DIAGNOSTIC STUDIES: Oxygen Saturation is 95% on RA, normal by my interpretation.    COORDINATION OF CARE:  8:08 PM Discussed treatment plan with patient at bedside.  Patient acknowledges and agrees with plan.    Imaging Review Dg Chest 2 View  01/25/2015  CLINICAL DATA:  Cough for 1 week EXAM: CHEST  2 VIEW COMPARISON:  Jun 03, 2013 FINDINGS: There is no edema or consolidation. Heart size and pulmonary vascularity are normal. No adenopathy. No bone lesions. IMPRESSION: No edema or consolidation. Electronically Signed   By: Lowella Grip III M.D.   On: 01/25/2015 20:49   I have personally reviewed and evaluated these images and lab results as part of my medical decision-making.  MDM   Final diagnoses:  Cough   Patient non-toxic appearing. Hypertensive. Most likely URI. Gave duoneb and prednisone in ED; patient's wheezing resolved in reassessment. Patient may be safely discharged home with z-pack and prednisone for symptomatic treatment. Discussed reasons for return. Stressed importance of PCP follow-up given hypertension here in the ED, and in past visits. Patient to follow-up with primary care provider within one week. Patient in understanding and agreement with the plan.  I personally performed the services described in this documentation, which was scribed in my presence. The recorded information has been reviewed and is accurate.   [place] Lions, PA-C 01/28/15 [place], MD 01/30/15 [phone]

## 2015-01-25 NOTE — Discharge Instructions (Signed)
[name],  Nice meeting you! Please follow-up with your primary care provider. Return to the emergency department if you develop shortness of breath, have chest pain, are unable to keep foods down. Feel better soon!  S. Wendie Simmer, PA-C   Cough, Adult Coughing is a reflex that clears your throat and your airways. Coughing helps to heal and protect your lungs. It is normal to cough occasionally, but a cough that happens with other symptoms or lasts a long time may be a sign of a condition that needs treatment. A cough may last only 2-3 weeks (acute), or it may last longer than 8 weeks (chronic). CAUSES Coughing is commonly caused by:  Breathing in substances that irritate your lungs.  A viral or bacterial respiratory infection.  Allergies.  Asthma.  Postnasal drip.  Smoking.  Acid backing up from the stomach into the esophagus (gastroesophageal reflux).  Certain medicines.  Chronic lung problems, including COPD (or rarely, lung cancer).  Other medical conditions such as heart failure. HOME CARE INSTRUCTIONS  Pay attention to any changes in your symptoms. Take these actions to help with your discomfort:  Take medicines only as told by your health care provider.  If you were prescribed an antibiotic medicine, take it as told by your health care provider. Do not stop taking the antibiotic even if you start to feel better.  Talk with your health care provider before you take a cough suppressant medicine.  Drink enough fluid to keep your urine clear or pale yellow.  If the air is dry, use a cold steam vaporizer or humidifier in your bedroom or your home to help loosen secretions.  Avoid anything that causes you to cough at work or at home.  If your cough is worse at night, try sleeping in a semi-upright position.  Avoid cigarette smoke. If you smoke, quit smoking. If you need help quitting, ask your health care provider.  Avoid caffeine.  Avoid  alcohol.  Rest as needed. SEEK MEDICAL CARE IF:   You have new symptoms.  You cough up pus.  Your cough does not get better after 2-3 weeks, or your cough gets worse.  You cannot control your cough with suppressant medicines and you are losing sleep.  You develop pain that is getting worse or pain that is not controlled with pain medicines.  You have a fever.  You have unexplained weight loss.  You have night sweats. SEEK IMMEDIATE MEDICAL CARE IF:  You cough up blood.  You have difficulty breathing.  Your heartbeat is very fast.   This information is not intended to replace advice given to you by your health care provider. Make sure you discuss any questions you have with your health care provider.   Document Released: 07/06/2010 Document Revised: 09/28/2014 Document Reviewed: 03/16/2014 Elsevier Interactive Patient Education 2016 [employer].   Emergency Department Resource Guide 1) Find a Doctor and Pay Out of Pocket Although you won't have to find out who is covered by your insurance plan, it is a good idea to ask around and get recommendations. You will then need to call the office and see if the doctor you have chosen will accept you as a new patient and what types of options they offer for patients who are self-pay. Some doctors offer discounts or will set up payment plans for their patients who do not have insurance, but you will need to ask so you aren't surprised when you get to your appointment.  2)  [place] Department Not all health departments have doctors that can see patients for sick visits, but many do, so it is worth a call to see if yours does. If you don't know where your local health department is, you can check in your phone book. The CDC also has a tool to help you locate your state's health department, and many state websites also have listings of all of their local health departments.  3) Find a [facility] If your illness is  not likely to be very severe or complicated, you may want to try a walk in clinic. These are popping up all over the country in pharmacies, drugstores, and shopping centers. They're usually staffed by nurse practitioners or physician assistants that have been trained to treat common illnesses and complaints. They're usually fairly quick and inexpensive. However, if you have serious medical issues or chronic medical problems, these are probably not your best option.  No Primary Care Doctor: - Call Health Connect at  [phone] - they can help you locate a primary care doctor that  accepts your insurance, provides certain services, etc. - Physician Referral Service- [phone]  Chronic Pain Problems: Organization         Address  Phone   Notes  [facility]  [phone] Patients need to be referred by their primary care doctor.   Medication Assistance: Organization         Address  Phone   Notes  [facility] Medication [facility] [address] [place], [place] [address] [phone] --Must be a resident of [facility] -- Must have NO insurance coverage whatsoever (no Medicaid/ Medicare, etc.) -- The pt. MUST have a primary care doctor that directs their care regularly and follows them in the community   MedAssist  [phone]   [employer]  [phone]    Agencies that provide inexpensive medical care: Organization         Address  Phone   Notes  [place]  [phone]   [facility]    [phone]   [facility] [place], [place] [address] [phone]   [place] [address] [place] [phone]   Planned Parenthood    [phone]   [facility]    [phone]   [place] and [address], [place] Phone:  [phone], Fax:  [phone] Hours of Operation:  9 am - 6  pm, M-F.  Also accepts Medicaid/Medicare and self-pay.  [facility] for [place] [address], [address], [place] Phone: [phone], Fax: [phone]. Hours of Operation:  8:30 am - 5:30 pm, M-F.  Also accepts Medicaid and self-pay.  [facility] [address] [place] Phone: [phone]   [place], [place], [place] [phone], Ext. [phone] Mondays & Thursdays: 7-9 AM.  First 15 patients are seen on a first come, first serve basis.    [place] Providers:  Organization         Address  Phone   Notes  [facility] [address], [place] [phone] Also accepts self-pay patients.  [place], [place]  [phone]   [place], [address],  [place] [phone]   [place] [address], [place] [phone]   Lucianne Lei [address], [place]   [phone] Only accepts [place] Access [place] patients after they have their name applied to their card.   Self-Pay (no insurance) in [facility]:  Organization         Address  Phone   Notes  Sickle Cell Patients, [facility] [place] [phone]   [facility] [phone]   [facility] [place]  [place], [place], [place] [phone]   [facility]/Dr. Osei-Bonsu  [address], [place] or [address], [place] [phone] Phone number for both [place] and [place] locations is the same.  [facility] [address], [place] [phone]   [facility] [address], [place] or [address] [phone]   [facility] [address], [place] [phone], phone; [phone], fax Sees patients 1st and 3rd Saturday of every month.  Must not qualify for public or private insurance (i.e. Medicaid, Medicare, [place] Health Choice, Veterans' Benefits)  Household income should be no more than 200% of the poverty level The clinic cannot treat you if you are pregnant or think you are pregnant  Sexually transmitted diseases are not treated at the clinic.    Dental Care: Organization         Address  Phone  Notes  [facility] Department of [facility] [place] [phone] Accepts children up to age 45 who are enrolled in [place] or [place]; pregnant women with a Medicaid card; and children who have applied for Medicaid or [place] Health Choice, but were declined, whose parents can pay a reduced fee at time of service.  [facility] Department of [facility]  [address], [place] [phone] Accepts children up to age 71 who are enrolled in [place] or [place]; pregnant women with a Medicaid card; and children who have applied for Medicaid or [place] Health Choice, but were declined, whose parents can pay a reduced fee at time of service.  [place] Adult Dental Access PROGRAM  Industry [phone] Patients are seen by appointment only. Walk-ins are not accepted. [place] will see patients 66 years of age and older. Monday - Tuesday (8am-5pm) Most Wednesdays (8:30-5pm) $30 per visit, cash only  [facility] Adult Dental Access PROGRAM  [address], [place] [phone] Patients are seen by appointment only. Walk-ins are not accepted. [place] will see patients 16 years of age and older. One Wednesday Evening (Monthly: Volunteer Based).  $30 per visit, cash only  [place]  [phone] for adults; Children under age 73, call Graduate Pediatric Dentistry at [phone]. Children aged 81-14, please call [phone] to request a pediatric application.  Dental services are provided in all areas of dental care including fillings, crowns and bridges, complete and partial dentures, implants, gum treatment, root canals, and extractions. Preventive care is also provided. Treatment is provided to both adults and children. Patients are selected via a lottery and there is often a waiting list.   [facility] [address], [place]  [phone] [email or web address]   [facility] [address] [place], [place] [phone], Ext. [phone] Second and Fourth Thursday of each month, opens at 6:30 AM; Clinic ends at 9 AM.  Patients are seen on a first-come first-served basis, and a limited number are seen during each clinic.   [facility]  [address] Hillard Danker [place], [place] [phone]   Eligibility Requirements You must have lived in [place], [place], or [place] counties for at least the last three months.   You cannot be eligible for state or federal sponsored [employer], including [employer], [place], or [employer].   You generally cannot be eligible for healthcare insurance through your employer.    How to apply: Eligibility screenings are held every Tuesday and Wednesday afternoon from 1:00 pm until 4:00 pm. You do not need an appointment for the interview!  [facility] [address], [place], [place]   [place]  [place] Department  [place]  [phone]    Behavioral Health Resources in the Community: Intensive Outpatient Programs Organization         Address  Phone  Notes  [place]. [address] [place], [place] [phone]   [facility] Outpatient [address] [place], [place]   ADS: Alcohol & Drug Svcs  [address], [place], [place]   [place] [address],  [place], [place] or [phone]   Substance Abuse Resources Organization         Address  Phone  Notes  Alcohol and Drug Services  [phone]   [place]  [phone]   [facility] [place]   Chinita Pester  [phone]   Residential & Outpatient Substance Abuse Program  [phone]   Psychological Services Organization         Address  Phone  Notes  [facility] [place]  [place]  [phone]   [place] [address], [place] or [phone]    Mobile Crisis Teams Organization         Address  Phone  Notes  [facility]rapeutic Alternatives, Mobile Crisis Care Unit  [phone]   [facility] Psychotherapeutic Services  [address] [place], [place]   [place] Levels [address] [place] [phone]    Self-Help/Support Groups Organization         Address  Phone             Notes  [place]. of [place] - variety of support groups  [place] Call for more information  Narcotics Anonymous (NA), Caring Services [address], [employer] [place]  2 meetings at this location   Special educational needs teacher         Address  Phone  Notes  [facility] [place] and [place],    [place]  [phone]   [facility]  [address] [place] [id number], [place], [place]   [place], [place] [phone] Admissions: 8am-3pm M-F  Incentives Substance [place] [address]    [place], [place] [phone]   [facility] [address] Jadene Pierini [place], [place]   [facility] [address],  [place], [place]   Insight Programs -  Intensive Outpatient [address] Kristeen [address], [place], [place]  [phone]   [facility] ([place].,  [place], [place] [phone] or [phone]   [facility] Services (RTS) [address], [place], [place] Accepts Medicaid  [facility] [place] [address],  [place] [phone] Substance Abuse/Addiction Treatment   [facility] Organization         Address  Phone  Notes  [facility]  [phone]   Domenic Schwab, PhD [address] Arlis Porta [place], [place]   [phone] or [phone]   [place], [place] [phone]   [address], [place], [place] [phone] Insurance/Medicaid/sponsorship through [facility] and Families [address] [place]                                    [place], [place] [phone] [place] [address], [place] [phone]    Dr. Adele Schilder  [phone]   [facility] Dept. 1) [address] [place] 2) [place] 3)  [place] [address], [place] [phone]  [phone]   [place] [phone] or [phone] (After Hours)

## 2015-01-25 NOTE — ED Notes (Signed)
Pt c/o cough, sinus infection x 7 days. States she has been around family who have been sick. States she has taken nyquil/dayquil for symptoms.

## 2015-04-10 ENCOUNTER — Emergency Department (HOSPITAL_COMMUNITY): Payer: BLUE CROSS/BLUE SHIELD

## 2015-04-10 ENCOUNTER — Encounter (HOSPITAL_COMMUNITY): Payer: Self-pay | Admitting: *Deleted

## 2015-04-10 ENCOUNTER — Emergency Department (HOSPITAL_COMMUNITY)
Admission: EM | Admit: 2015-04-10 | Discharge: 2015-04-11 | Disposition: A | Discharge status: Home / Self Care | Payer: BLUE CROSS/BLUE SHIELD | Attending: Emergency Medicine | Admitting: Emergency Medicine

## 2015-04-10 DIAGNOSIS — Z79899 Other long term (current) drug therapy: Secondary | ICD-10-CM | POA: Diagnosis not present

## 2015-04-10 DIAGNOSIS — R6889 Other general symptoms and signs: Secondary | ICD-10-CM

## 2015-04-10 DIAGNOSIS — Z7952 Long term (current) use of systemic steroids: Secondary | ICD-10-CM | POA: Diagnosis not present

## 2015-04-10 DIAGNOSIS — R079 Chest pain, unspecified: Secondary | ICD-10-CM | POA: Diagnosis not present

## 2015-04-10 DIAGNOSIS — Z8742 Personal history of other diseases of the female genital tract: Secondary | ICD-10-CM | POA: Insufficient documentation

## 2015-04-10 DIAGNOSIS — R51 Headache: Secondary | ICD-10-CM | POA: Insufficient documentation

## 2015-04-10 DIAGNOSIS — R05 Cough: Secondary | ICD-10-CM | POA: Diagnosis not present

## 2015-04-10 DIAGNOSIS — Z8632 Personal history of gestational diabetes: Secondary | ICD-10-CM | POA: Insufficient documentation

## 2015-04-10 DIAGNOSIS — M791 Myalgia: Secondary | ICD-10-CM | POA: Insufficient documentation

## 2015-04-10 DIAGNOSIS — R197 Diarrhea, unspecified: Secondary | ICD-10-CM | POA: Diagnosis not present

## 2015-04-10 DIAGNOSIS — J3489 Other specified disorders of nose and nasal sinuses: Secondary | ICD-10-CM | POA: Insufficient documentation

## 2015-04-10 DIAGNOSIS — Z3202 Encounter for pregnancy test, result negative: Secondary | ICD-10-CM | POA: Insufficient documentation

## 2015-04-10 DIAGNOSIS — Z87891 Personal history of nicotine dependence: Secondary | ICD-10-CM | POA: Diagnosis not present

## 2015-04-10 LAB — URINALYSIS, ROUTINE W REFLEX MICROSCOPIC
Bilirubin Urine: NEGATIVE
Glucose, UA: NEGATIVE mg/dL
Hgb urine dipstick: NEGATIVE
Ketones, ur: NEGATIVE mg/dL
LEUKOCYTES UA: NEGATIVE
NITRITE: NEGATIVE
PH: 8 (ref 5.0–8.0)
Protein, ur: NEGATIVE mg/dL
SPECIFIC GRAVITY, URINE: 1.018 (ref 1.005–1.030)

## 2015-04-10 LAB — POC URINE PREG, ED: PREG TEST UR: NEGATIVE

## 2015-04-10 LAB — CBC
HEMATOCRIT: 37.1 % (ref 36.0–46.0)
HEMOGLOBIN: 12.2 g/dL (ref 12.0–15.0)
MCH: 29.3 pg (ref 26.0–34.0)
MCHC: 32.9 g/dL (ref 30.0–36.0)
MCV: 89.2 fL (ref 78.0–100.0)
Platelets: 210 10*3/uL (ref 150–400)
RBC: 4.16 MIL/uL (ref 3.87–5.11)
RDW: 14.9 % (ref 11.5–15.5)
WBC: 4.2 10*3/uL (ref 4.0–10.5)

## 2015-04-10 LAB — COMPREHENSIVE METABOLIC PANEL
ALBUMIN: 3.9 g/dL (ref 3.5–5.0)
ALT: 18 U/L (ref 14–54)
ANION GAP: 10 (ref 5–15)
AST: 21 U/L (ref 15–41)
Alkaline Phosphatase: 84 U/L (ref 38–126)
BILIRUBIN TOTAL: 0.6 mg/dL (ref 0.3–1.2)
BUN: 5 mg/dL — ABNORMAL LOW (ref 6–20)
CO2: 24 mmol/L (ref 22–32)
Calcium: 9.2 mg/dL (ref 8.9–10.3)
Chloride: 104 mmol/L (ref 101–111)
Creatinine, Ser: 0.96 mg/dL (ref 0.44–1.00)
GFR calc non Af Amer: >60 mL/min (ref >60)
GLUCOSE: 89 mg/dL (ref 65–99)
POTASSIUM: 4.2 mmol/L (ref 3.5–5.1)
Sodium: 138 mmol/L (ref 135–145)
TOTAL PROTEIN: 7 g/dL (ref 6.5–8.1)

## 2015-04-10 LAB — LIPASE, BLOOD: LIPASE: 23 U/L (ref 11–51)

## 2015-04-10 MED ORDER — OSELTAMIVIR PHOSPHATE 75 MG PO CAPS
75.0000 mg | ORAL_CAPSULE | Freq: Once | ORAL | Status: AC
  Administered 2015-04-10: 75 mg via ORAL
  Filled 2015-04-10: qty 1

## 2015-04-10 MED ORDER — HYDROCOD POLST-CPM POLST ER 10-8 MG/5ML PO SUER
5.0000 mL | Freq: Once | ORAL | Status: AC
  Administered 2015-04-10: 5 mL via ORAL
  Filled 2015-04-10: qty 5

## 2015-04-10 MED ORDER — ONDANSETRON 4 MG PO TBDP
4.0000 mg | ORAL_TABLET | Freq: Once | ORAL | Status: AC
  Administered 2015-04-10: 4 mg via ORAL
  Filled 2015-04-10: qty 1

## 2015-04-10 MED ORDER — OSELTAMIVIR PHOSPHATE 75 MG PO CAPS: 75.0000 mg | ORAL_CAPSULE | Freq: Two times a day (BID) | ORAL | Status: DC

## 2015-04-10 MED ORDER — GUAIFENESIN-CODEINE 100-10 MG/5ML PO SOLN: 5.0000 mL | Freq: Three times a day (TID) | ORAL | Status: DC | PRN

## 2015-04-10 MED ORDER — ONDANSETRON HCL 4 MG PO TABS: 4.0000 mg | ORAL_TABLET | Freq: Four times a day (QID) | ORAL | Status: DC

## 2015-04-10 MED ORDER — PROMETHAZINE HCL 25 MG/ML IJ SOLN
12.5000 mg | Freq: Once | INTRAMUSCULAR | Status: AC
  Administered 2015-04-10: 12.5 mg via INTRAVENOUS
  Filled 2015-04-10 (×2): qty 1

## 2015-04-10 MED ORDER — SODIUM CHLORIDE 0.9 % IV SOLN
INTRAVENOUS | Status: AC
  Administered 2015-04-10: 22:00:00 via INTRAVENOUS

## 2015-04-10 NOTE — ED Provider Notes (Signed)
CSN: [id number]     Arrival date & time 04/10/15  1640 History  By signing my name below, I, Rebecca Spence, attest that this documentation has been prepared under the direction and in the presence of Rebecca Baller, NP. Electronically Signed: [name], Scribe. 04/10/2015. 8:00 PM.   Chief Complaint  Patient presents with  . Cough  . Generalized Body Aches  . Headache  . Abdominal Pain  . Diarrhea   Patient is a 35 y.o. female presenting with cough. The history is provided by the patient. No language interpreter was used.  Cough Cough characteristics:  Productive Sputum characteristics:  Green Severity:  Mild Onset quality:  Sudden Duration:  2 days Timing:  Constant Progression:  Unchanged Chronicity:  New Relieved by:  Nothing Worsened by:  Nothing tried Ineffective treatments:  Cough suppressants Associated symptoms: chest pain, headaches and myalgias   Chest pain:    Quality:  Sharp   Severity:  Mild   Onset quality:  Sudden   Duration:  1 day   Timing:  Constant   Progression:  Unchanged   Chronicity:  New Headaches:    Severity:  Mild   Onset quality:  Sudden   Duration:  1 day   Timing:  Constant   Progression:  Unchanged   Chronicity:  New Myalgias:    Location:  Generalized   Quality:  Unable to specify   Severity:  Mild   Onset quality:  Sudden   Duration:  2 days   Timing:  Constant   Progression:  Unchanged  HPI Comments: Rebecca Spence is a 35 y.o. female who presents to the Emergency Department complaining of sudden onset, constant, mild abdominal pain, watery, brown diarrhea, productive cough with green sputum, and generalied body aches which began two day ago. Pt also notes an associated HA, loss of appetite, vomiting, nausea, sharp chest pain which began today, onset of a fever yesterday and two days ago, and four episodes of diarrhea today. She reports that she last ate a piece if a pretzel about 10 minutes ago. She has tried  taking Alkaseltzer, Tylenol, and over the counter medication for cough and congestion with no relief. Pt does not take medication regularly and is healthy otherwise. Reports occasional sharp chest pain with cough.  Past Medical History  Diagnosis Date  . Pregnancy induced hypertension   . Bilateral ovarian cysts   . Headache(784.0)   . Gestational diabetes    History reviewed. No pertinent past surgical history. Family History  Problem Relation Age of Onset  . Anesthesia problems Neg Hx   . Hypotension Neg Hx   . Malignant hyperthermia Neg Hx   . Pseudochol deficiency Neg Hx   . Diabetes Mother   . Hypertension Mother   . Diabetes Maternal Grandmother   . Hyperlipidemia Maternal Grandmother    Social History  Substance Use Topics  . Smoking status: Former Smoker    Types: Cigarettes  . Smokeless tobacco: Never Used  . Alcohol Use: No     Comment: socially-not with pregnancy   OB History    Gravida Para Term Preterm AB TAB SAB Ectopic Multiple Living   2 2 2  0      2     Review of Systems  Respiratory: Positive for cough.   Cardiovascular: Positive for chest pain.  Musculoskeletal: Positive for myalgias.  Neurological: Positive for headaches.  All other systems reviewed and are negative.   Allergies  [place]  Home Medications  Prior to Admission medications   Medication Sig Start Date End Date Taking? Authorizing Provider  acetaminophen (TYLENOL) 500 MG tablet Take 1,000 mg by mouth every 6 (six) hours as needed for moderate pain.    Historical Provider, MD  albuterol (PROVENTIL HFA;VENTOLIN HFA) 108 (90 BASE) MCG/ACT inhaler Inhale 2 puffs into the lungs every 4 (four) hours as needed for wheezing or shortness of breath. 12/23/12   Margarita Mail, PA-C  azithromycin (ZITHROMAX) 250 MG tablet Take 1 tablet (250 mg total) by mouth daily. Take first 2 tablets together, then 1 every day until finished. 01/25/15   [place] Lions, PA-C  dextromethorphan (DELSYM) 30  MG/5ML liquid Take 60 mg by mouth as needed for cough.    Historical Provider, MD  guaiFENesin-codeine 100-10 MG/5ML syrup Take 5 mLs by mouth 3 (three) times daily as needed for cough. 04/10/15   Hope Bunnie Pion, NP  ondansetron (ZOFRAN) 4 MG tablet Take 1 tablet (4 mg total) by mouth every 6 (six) hours. 04/10/15   Hope Bunnie Pion, NP  ondansetron (ZOFRAN-ODT) 4 MG disintegrating tablet Take 1 tablet (4 mg total) by mouth every 8 (eight) hours as needed for nausea or vomiting. 10/17/14   Everlene Balls, MD  oseltamivir (TAMIFLU) 75 MG capsule Take 1 capsule (75 mg total) by mouth every 12 (twelve) hours. 04/10/15   Hope Bunnie Pion, NP  predniSONE (DELTASONE) 20 MG tablet Take 3 tablets (60 mg total) by mouth daily. 01/25/15   Crandon Lions, PA-C   BP 140/90 mmHg  Pulse 85  Temp(Src) 99.4 F (37.4 C) (Oral)  Resp 18  SpO2 96%  LMP 04/02/2015 Physical Exam  Constitutional: She is oriented to person, place, and time. She appears well-developed and well-nourished. No distress.  HENT:  Head: Normocephalic and atraumatic.  Right Ear: Tympanic membrane normal.  Left Ear: Tympanic membrane normal.  Nose: Rhinorrhea present.  Mouth/Throat: Uvula is midline, oropharynx is clear and moist and mucous membranes are normal.  Eyes: Conjunctivae and EOM are normal.  Neck: Normal range of motion. Neck supple.  Cardiovascular: Normal rate and regular rhythm.   Pulmonary/Chest: Effort normal. She has no wheezes. She has no rales. She exhibits tenderness.  Abdominal: Soft. Bowel sounds are normal. She exhibits no distension.  Musculoskeletal: Normal range of motion.  Lymphadenopathy:    She has no cervical adenopathy.  Neurological: She is alert and oriented to person, place, and time.  Skin: Skin is warm and dry.  Psychiatric: She has a normal mood and affect. Her behavior is normal.  Nursing note and vitals reviewed.   ED Course  Procedures  DIAGNOSTIC STUDIES:    Oxygen Saturation is 97% on RA,  adequate by my interpretation.   COORDINATION OF CARE:  7:59 PM Will order a chest x-ray and will review labs. Will administer pt Zofran. Discussed treatment plan with pt at bedside and pt agreed to plan.   9:47 PM Pt received medication for nausea and received the medication for cough and threw it back up.  Labs Review Labs Reviewed  COMPREHENSIVE METABOLIC PANEL - Abnormal; Notable for the following:    BUN 5 (*)    All other components within normal limits  LIPASE, BLOOD  CBC  URINALYSIS, ROUTINE W REFLEX MICROSCOPIC (NOT AT [facility])  POC URINE PREG, ED   Imaging Review Dg Chest 2 View  04/10/2015  CLINICAL DATA:  Productive cough EXAM: CHEST  2 VIEW COMPARISON:  01/25/2015 FINDINGS: The heart size and mediastinal contours are within  normal limits. Both lungs are clear. The visualized skeletal structures are unremarkable. IMPRESSION: No active cardiopulmonary disease. Electronically Signed   By: Inez Catalina M.D.   On: 04/10/2015 21:28   EKG Interpretation NSR, NORMAL EKG  I have personally reviewed and evaluated these images and lab results as part of my medical decision-making.  MDM  35 y.o. female with cough, congestion, fever, chills, nausea and diarrhea stable for d/c without respiratory distress and O2 SAT 96% on R/A. N/v resolved with medication. Patient feeling better after IV hydration. Will treat with Tamiflu, Zofran and Robitussin AC. She will return for worsening symptoms.   Final diagnoses:  Flu-like symptoms   I personally performed the services described in this documentation, which was scribed in my presence. The recorded information has been reviewed and is accurate.    [place], NP 04/11/15 0004  Forde Dandy, MD 04/11/15 [phone]

## 2015-04-10 NOTE — ED Notes (Signed)
For 2 days pt has had abd pain, diarrhea, cough, body aches.  Took alkaseltzer with no relief. States her abd pain is similar to an ovarian cyst she had prior.

## 2015-06-17 ENCOUNTER — Emergency Department (HOSPITAL_COMMUNITY)
Admission: EM | Admit: 2015-06-17 | Discharge: 2015-06-17 | Disposition: A | Discharge status: Home / Self Care | Payer: BLUE CROSS/BLUE SHIELD | Attending: Emergency Medicine | Admitting: Emergency Medicine

## 2015-06-17 ENCOUNTER — Encounter (HOSPITAL_COMMUNITY): Payer: Self-pay

## 2015-06-17 DIAGNOSIS — Y9389 Activity, other specified: Secondary | ICD-10-CM | POA: Diagnosis not present

## 2015-06-17 DIAGNOSIS — Y998 Other external cause status: Secondary | ICD-10-CM | POA: Diagnosis not present

## 2015-06-17 DIAGNOSIS — Z8742 Personal history of other diseases of the female genital tract: Secondary | ICD-10-CM | POA: Insufficient documentation

## 2015-06-17 DIAGNOSIS — Z7952 Long term (current) use of systemic steroids: Secondary | ICD-10-CM | POA: Insufficient documentation

## 2015-06-17 DIAGNOSIS — Z87891 Personal history of nicotine dependence: Secondary | ICD-10-CM | POA: Insufficient documentation

## 2015-06-17 DIAGNOSIS — Z79899 Other long term (current) drug therapy: Secondary | ICD-10-CM | POA: Diagnosis not present

## 2015-06-17 DIAGNOSIS — Y92410 Unspecified street and highway as the place of occurrence of the external cause: Secondary | ICD-10-CM | POA: Insufficient documentation

## 2015-06-17 DIAGNOSIS — S8011XA Contusion of right lower leg, initial encounter: Secondary | ICD-10-CM | POA: Insufficient documentation

## 2015-06-17 DIAGNOSIS — S8992XA Unspecified injury of left lower leg, initial encounter: Secondary | ICD-10-CM | POA: Diagnosis present

## 2015-06-17 DIAGNOSIS — S8012XA Contusion of left lower leg, initial encounter: Secondary | ICD-10-CM | POA: Diagnosis not present

## 2015-06-17 MED ORDER — CYCLOBENZAPRINE HCL 10 MG PO TABS: 10.0000 mg | ORAL_TABLET | Freq: Two times a day (BID) | ORAL | Status: DC | PRN

## 2015-06-17 MED ORDER — IBUPROFEN 800 MG PO TABS: 800.0000 mg | ORAL_TABLET | Freq: Three times a day (TID) | ORAL | Status: DC
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
## 2015-06-17 NOTE — Discharge Instructions (Signed)
Contusion °A contusion is a deep bruise. Contusions are the result of a blunt injury to tissues and muscle fibers under the skin. The injury causes bleeding under the skin. The skin overlying the contusion may turn blue, purple, or yellow. Minor injuries will give you a painless contusion, but more severe contusions may stay painful and swollen for a few weeks.  °CAUSES  °This condition is usually caused by a blow, trauma, or direct force to an area of the body. °SYMPTOMS  °Symptoms of this condition include: °· Swelling of the injured area. °· Pain and tenderness in the injured area. °· Discoloration. The area may have redness and then turn blue, purple, or yellow. °DIAGNOSIS  °This condition is diagnosed based on a physical exam and medical history. An X-ray, CT scan, or MRI may be needed to determine if there are any associated injuries, such as broken bones (fractures). °TREATMENT  °Specific treatment for this condition depends on what area of the body was injured. In general, the best treatment for a contusion is resting, icing, applying pressure to (compression), and elevating the injured area. This is often called the RICE strategy. Over-the-counter anti-inflammatory medicines may also be recommended for pain control.  °HOME CARE INSTRUCTIONS  °· Rest the injured area. °· If directed, apply ice to the injured area: °· Put ice in a plastic bag. °· Place a towel between your skin and the bag. °· Leave the ice on for 20 minutes, 2-3 times per day. °· If directed, apply light compression to the injured area using an elastic bandage. Make sure the bandage is not wrapped too tightly. Remove and reapply the bandage as directed by your health care provider. °· If possible, raise (elevate) the injured area above the level of your heart while you are sitting or lying down. °· Take over-the-counter and prescription medicines only as told by your health care provider. °SEEK MEDICAL CARE IF: °· Your symptoms do not  improve after several days of treatment. °· Your symptoms get worse. °· You have difficulty moving the injured area. °SEEK IMMEDIATE MEDICAL CARE IF:  °· You have severe pain. °· You have numbness in a hand or foot. °· Your hand or foot turns pale or cold. °  °This information is not intended to replace advice given to you by your health care provider. Make sure you discuss any questions you have with your health care provider. °  °Document Released: 10/17/2004 Document Revised: 09/28/2014 Document Reviewed: 05/25/2014 °Elsevier Interactive Patient Education ©2016 [employer] ° °Motor Vehicle Collision °It is common to have multiple bruises and sore muscles after a motor vehicle collision (MVC). These tend to feel worse for the first 24 hours. You may have the most stiffness and soreness over the first several hours. You may also feel worse when you wake up the first morning after your collision. After this point, you will usually begin to improve with each day. The speed of improvement often depends on the severity of the collision, the number of injuries, and the location and nature of these injuries. °HOME CARE INSTRUCTIONS °· Put ice on the injured area. °¨ Put ice in a plastic bag. °¨ Place a towel between your skin and the bag. °¨ Leave the ice on for 15-20 minutes, 3-4 times a day, or as directed by your health care provider. °· Drink enough fluids to keep your urine clear or pale yellow. Do not drink alcohol. °· Take a warm shower or bath once or twice a   day. This will increase blood flow to sore muscles. °· You may return to activities as directed by your caregiver. Be careful when lifting, as this may aggravate neck or back pain. °· Only take over-the-counter or prescription medicines for pain, discomfort, or fever as directed by your caregiver. Do not use aspirin. This may increase bruising and bleeding. °SEEK IMMEDIATE MEDICAL CARE IF: °· You have numbness, tingling, or weakness in the arms or  legs. °· You develop severe headaches not relieved with medicine. °· You have severe neck pain, especially tenderness in the middle of the back of your neck. °· You have changes in bowel or bladder control. °· There is increasing pain in any area of the body. °· You have shortness of breath, light-headedness, dizziness, or fainting. °· You have chest pain. °· You feel sick to your stomach (nauseous), throw up (vomit), or sweat. °· You have increasing abdominal discomfort. °· There is blood in your urine, stool, or vomit. °· You have pain in your shoulder (shoulder strap areas). °· You feel your symptoms are getting worse. °MAKE SURE YOU: °· Understand these instructions. °· Will watch your condition. °· Will get help right away if you are not doing well or get worse. °  °This information is not intended to replace advice given to you by your health care provider. Make sure you discuss any questions you have with your health care provider. °  °Document Released: 01/07/2005 Document Revised: 01/28/2014 Document Reviewed: 06/06/2010 °Elsevier Interactive Patient Education ©2016 [employer] Inc. ° °

## 2015-06-17 NOTE — ED Notes (Signed)
Patient is alert and orientedx4.  Patient was explained discharge instructions and they understood them with no questions.   

## 2015-08-16 ENCOUNTER — Encounter (HOSPITAL_COMMUNITY): Payer: Self-pay | Admitting: *Deleted

## 2015-08-16 ENCOUNTER — Inpatient Hospital Stay (HOSPITAL_COMMUNITY): Payer: BLUE CROSS/BLUE SHIELD

## 2015-08-16 ENCOUNTER — Inpatient Hospital Stay (HOSPITAL_COMMUNITY)
Admission: AD | Admit: 2015-08-16 | Discharge: 2015-08-16 | Disposition: A | Discharge status: Home / Self Care | Payer: BLUE CROSS/BLUE SHIELD | Source: Ambulatory Visit | Attending: Obstetrics and Gynecology | Admitting: Obstetrics and Gynecology

## 2015-08-16 DIAGNOSIS — R102 Pelvic and perineal pain: Secondary | ICD-10-CM | POA: Diagnosis not present

## 2015-08-16 DIAGNOSIS — O10919 Unspecified pre-existing hypertension complicating pregnancy, unspecified trimester: Secondary | ICD-10-CM | POA: Diagnosis present

## 2015-08-16 DIAGNOSIS — R109 Unspecified abdominal pain: Secondary | ICD-10-CM | POA: Insufficient documentation

## 2015-08-16 DIAGNOSIS — O9989 Other specified diseases and conditions complicating pregnancy, childbirth and the puerperium: Secondary | ICD-10-CM | POA: Diagnosis not present

## 2015-08-16 DIAGNOSIS — Z3A01 Less than 8 weeks gestation of pregnancy: Secondary | ICD-10-CM | POA: Diagnosis not present

## 2015-08-16 DIAGNOSIS — O3680X0 Pregnancy with inconclusive fetal viability, not applicable or unspecified: Secondary | ICD-10-CM

## 2015-08-16 DIAGNOSIS — Z87891 Personal history of nicotine dependence: Secondary | ICD-10-CM | POA: Diagnosis not present

## 2015-08-16 DIAGNOSIS — O26891 Other specified pregnancy related conditions, first trimester: Secondary | ICD-10-CM | POA: Diagnosis not present

## 2015-08-16 DIAGNOSIS — O10911 Unspecified pre-existing hypertension complicating pregnancy, first trimester: Secondary | ICD-10-CM

## 2015-08-16 DIAGNOSIS — O26899 Other specified pregnancy related conditions, unspecified trimester: Secondary | ICD-10-CM

## 2015-08-16 LAB — CBC
HCT: 34.5 % — ABNORMAL LOW (ref 36.0–46.0)
HEMOGLOBIN: 11.5 g/dL — AB (ref 12.0–15.0)
MCH: 29.5 pg (ref 26.0–34.0)
MCHC: 33.3 g/dL (ref 30.0–36.0)
MCV: 88.5 fL (ref 78.0–100.0)
Platelets: 301 10*3/uL (ref 150–400)
RBC: 3.9 MIL/uL (ref 3.87–5.11)
RDW: 15.4 % (ref 11.5–15.5)
WBC: 9.8 10*3/uL (ref 4.0–10.5)

## 2015-08-16 LAB — URINALYSIS, ROUTINE W REFLEX MICROSCOPIC
Bilirubin Urine: NEGATIVE
Glucose, UA: NEGATIVE mg/dL
Hgb urine dipstick: NEGATIVE
KETONES UR: NEGATIVE mg/dL
NITRITE: NEGATIVE
PROTEIN: NEGATIVE mg/dL
Specific Gravity, Urine: 1.025 (ref 1.005–1.030)
pH: 6 (ref 5.0–8.0)

## 2015-08-16 LAB — URINE MICROSCOPIC-ADD ON

## 2015-08-16 LAB — WET PREP, GENITAL
CLUE CELLS WET PREP: NONE SEEN
Sperm: NONE SEEN
Trich, Wet Prep: NONE SEEN
Yeast Wet Prep HPF POC: NONE SEEN

## 2015-08-16 LAB — HCG, QUANTITATIVE, PREGNANCY: hCG, Beta Chain, Quant, S: 1746 m[IU]/mL — ABNORMAL HIGH (ref <5)

## 2015-08-16 LAB — POCT PREGNANCY, URINE: PREG TEST UR: POSITIVE — AB

## 2015-08-16 MED ORDER — LABETALOL HCL 200 MG PO TABS: 200.0000 mg | ORAL_TABLET | Freq: Two times a day (BID) | ORAL | 3 refills | Status: DC

## 2015-08-16 NOTE — Discharge Instructions (Signed)

## 2015-08-16 NOTE — MAU Provider Note (Signed)
**Note Rebecca-Identified via Obfuscation** History     CSN: [id number]  Arrival date and time: 08/16/15 [id number]  Provider at bedside at 08/06/15 0946    Chief Complaint  Patient presents with  . Abdominal Cramping   HPI  35 y.o. year old female [id number] @[redacted]w[redacted]d  by LMP presents to the MAU with abdominal pain x 3 days. She describes it as intermittent cramping pains that "feel like menstrual cramps". Rates the pain at 3/10 on the pain scale. Tylenol provides moderate relief and cold drinks and movement make it worse. Associated with increased urinary frequency, feeling like she did not finish urinating. Denies fevers, chills, hematuria, vaginal bleeding, or vaginal discharge,. Patient is not pleased with being pregnant and LMP-6/18-6/20 June. Was not using any contracerption.  Sexual Hx: denies any history of STIs. Same partner for 9 years. Monogamous. Last Pap smear last year-normal.    Past Medical History:  Diagnosis Date  . Bilateral ovarian cysts   . Headache(784.0)   . Pregnancy induced hypertension     History reviewed. No pertinent surgical history.  Family History  Problem Relation Age of Onset  . Diabetes Mother   . Hypertension Mother   . Diabetes Maternal Grandmother   . Hyperlipidemia Maternal Grandmother   . Anesthesia problems Neg Hx   . Hypotension Neg Hx   . Malignant hyperthermia Neg Hx   . Pseudochol deficiency Neg Hx     Social History  Substance Use Topics  . Smoking status: Former Smoker    Types: Cigarettes  . Smokeless tobacco: Never Used  . Alcohol use No     Comment: socially-not with pregnancy    Allergies:  Allergies  Allergen Reactions  . Orange Anaphylaxis and Hives    "My throat starts closing up, my tongue, lips started swelling & burning    Prescriptions Prior to Admission  Medication Sig Dispense Refill Last Dose  . albuterol (PROVENTIL HFA;VENTOLIN HFA) 108 (90 BASE) MCG/ACT inhaler Inhale 2 puffs into the lungs every 4 (four) hours as needed for wheezing or shortness of  breath. 1 Inhaler 0 as needed  . cyclobenzaprine (FLEXERIL) 10 MG tablet Take 1 tablet (10 mg total) by mouth 2 (two) times daily as needed for muscle spasms. (Patient not taking: Reported on 08/16/2015) 20 tablet 0 Not Taking at Unknown time  . guaiFENesin-codeine 100-10 MG/5ML syrup Take 5 mLs by mouth 3 (three) times daily as needed for cough. (Patient not taking: Reported on 08/16/2015) 120 mL 0 Not Taking at Unknown time  . ibuprofen (ADVIL,MOTRIN) 800 MG tablet Take 1 tablet (800 mg total) by mouth 3 (three) times daily. (Patient not taking: Reported on 08/16/2015) 21 tablet 0 Not Taking at Unknown time  . ondansetron (ZOFRAN) 4 MG tablet Take 1 tablet (4 mg total) by mouth every 6 (six) hours. (Patient not taking: Reported on 08/16/2015) 12 tablet 0 Not Taking at Unknown time    Review of Systems  Constitutional: Negative.   HENT: Negative.   Respiratory: Negative.   Cardiovascular: Negative.   Gastrointestinal: Negative.   Genitourinary: Positive for frequency and urgency. Negative for dysuria and hematuria.  Skin: Negative.    All other systems negative.   Physical Exam   Blood pressure 151/98, pulse 85, temperature 98.9 F (37.2 C), resp. rate 18, last menstrual period 07/09/2015.  Physical Exam  Constitutional: She is oriented to person, place, and time. She appears well-developed and well-nourished.  Cardiovascular: Normal rate, regular rhythm and normal heart sounds.   Respiratory: Effort normal and breath sounds normal.  GI: Soft. Bowel sounds are normal.  Neurological: She is alert and oriented to person, place, and time.  Skin: Skin is warm and dry.  Psychiatric: She has a normal mood and affect. Her behavior is normal.   Results for orders placed or performed during the hospital encounter of 08/16/15 (from the past 24 hour(s))  Urinalysis, Routine w reflex microscopic (not at [facility])     Status: Abnormal   Collection Time: 08/16/15  8:00 AM  Result Value Ref Range    Color, Urine YELLOW YELLOW   APPearance HAZY (A) CLEAR   Specific Gravity, Urine 1.025 1.005 - 1.030   pH 6.0 5.0 - 8.0   Glucose, UA NEGATIVE NEGATIVE mg/dL   Hgb urine dipstick NEGATIVE NEGATIVE   Bilirubin Urine NEGATIVE NEGATIVE   Ketones, ur NEGATIVE NEGATIVE mg/dL   Protein, ur NEGATIVE NEGATIVE mg/dL   Nitrite NEGATIVE NEGATIVE   Leukocytes, UA SMALL (A) NEGATIVE  Urine microscopic-add on     Status: Abnormal   Collection Time: 08/16/15  8:00 AM  Result Value Ref Range   Squamous Epithelial / LPF 0-5 (A) Spence SEEN   WBC, UA 0-5 0 - 5 WBC/hpf   RBC / HPF 0-5 0 - 5 RBC/hpf   Bacteria, UA FEW (A) Spence SEEN   Urine-Other MUCOUS PRESENT   Pregnancy, urine POC     Status: Abnormal   Collection Time: 08/16/15  9:05 AM  Result Value Ref Range   Preg Test, Ur POSITIVE (A) NEGATIVE  CBC     Status: Abnormal   Collection Time: 08/16/15 10:37 AM  Result Value Ref Range   WBC 9.8 4.0 - 10.5 K/uL   RBC 3.90 3.87 - 5.11 MIL/uL   Hemoglobin 11.5 (L) 12.0 - 15.0 g/dL   HCT 34.5 (L) 36.0 - 46.0 %   MCV 88.5 78.0 - 100.0 fL   MCH 29.5 26.0 - 34.0 pg   MCHC 33.3 30.0 - 36.0 g/dL   RDW 15.4 11.5 - 15.5 %   Platelets 301 150 - 400 K/uL    Ultrasound OBSTETRIC <14 WK [place] AND TRANSVAGINAL OB US TECHNIQUE: Both transabdominal and transvaginal ultrasound examinations were performed for complete evaluation of the gestation as well as the maternal uterus, adnexal regions, and pelvic cul-Rebecca-sac. Transvaginal technique was performed to assess early pregnancy. COMPARISON:  Spence. FINDINGS: Intrauterine gestational sac: Probable single tiny gestational sac visualized Yolk sac:  Not visualized Embryo:  Not visualized MSD: 3  mm   5 w   0  d Subchorionic hemorrhage:  Spence visualized. Maternal uterus/adnexae: Both ovaries are normal in appearance. No adnexal mass identified. Tiny amount of simple free fluid seen in cul-Rebecca-sac. IMPRESSION: Probable single early intrauterine gestational sac,  but no yolk sac, fetal pole, or cardiac activity yet visualized. Recommend follow-up quantitative B-HCG levels and follow-up US in 14 days to confirm and assess viability.  MAU Course  Procedures Spence  MDM CBC and UA ordered-pregnancy determined Chlamydia and Gonorrhea and Wet Prep collected to rule out infectious process Transvaginal and transabdominal ultrasound to rule out ectopic Beta-HCG quant collected and will be repeated at 48 hr follow-up to rule out ectopic  Assessment and Plan  35 y.o. female [id number] @[redacted]w[redacted]d  presents to the MAU with lower abdominal pain x 3 days. Was unaware she was pregnant at time of admission.    Based on elevated blood pressures in the ED Labetalol 200 mg BID.  Continuing to rule out ectopic pregnancy. Patient to return to MAU for 48  hour repeat Beta-HCG quant.  Patient to return to the MAU prior to that if heavy bleeding or severe pain.   Salomon Mast 08/16/2015, 9:47 AM   Midwife attestation:  I have seen and examined this patient; I agree with above documentation in the student's note.   Desmarie Nandin is a 35 y.o. [id number] reporting abdominal cramping and urinary frequency x3 days.  She is not using contraception but was not expecting a positive pregnancy test today.   She denies LOF, vaginal bleeding, vaginal itching/burning, urinary symptoms, h/a, dizziness, n/v, or fever/chills.    PE: BP 155/97   Pulse 76   Temp 98.9 F (37.2 C)   Resp 16   LMP 07/09/2015  Gen: calm comfortable, NAD Resp: normal effort, no distress Abd: soft, no rebound tenderness or guarding  ROS, labs, PMH reviewed   A/P: Findings today could represent a normal early pregnancy, spontaneous abortion or ectopic pregnancy which can be life-threatening.  Ectopic precautions were given to the patient with plan to return in 48 hours for repeat quant hcg to evaluate pregnancy development.  Pt unable to come to office Fri morning so return to MAU Fri afternoon. Start  labetalol 200 mg BID today.  If pt decides to continue pregnancy, plans to seek care in [place].   1. Pregnancy of unknown anatomic location   2. Pelvic pain affecting pregnancy in first trimester, antepartum   3. Pelvic pain affecting pregnancy   4. Chronic hypertension complicating or reason for care during pregnancy, first trimester     Pt stable at time of D/C    LEFTWICH-KIRBY, LISA, CNM  2:03 PM

## 2015-08-16 NOTE — MAU Note (Signed)
Pt presents to MAU with complaints of lower abdominal cramping, Reports last normal menstrual cycle was June the 18th, and she had spotting this month when her cycle was due

## 2015-08-17 LAB — GC/CHLAMYDIA PROBE AMP (~~LOC~~) NOT AT ARMC
Chlamydia: NEGATIVE
NEISSERIA GONORRHEA: NEGATIVE

## 2015-08-17 LAB — HIV ANTIBODY (ROUTINE TESTING W REFLEX): HIV SCREEN 4TH GENERATION: NONREACTIVE

## 2015-08-18 ENCOUNTER — Inpatient Hospital Stay (HOSPITAL_COMMUNITY)
Admission: AD | Admit: 2015-08-18 | Discharge: 2015-08-18 | Disposition: A | Discharge status: Home / Self Care | Payer: BLUE CROSS/BLUE SHIELD | Source: Ambulatory Visit | Attending: Obstetrics & Gynecology | Admitting: Obstetrics & Gynecology

## 2015-08-18 ENCOUNTER — Inpatient Hospital Stay (HOSPITAL_COMMUNITY): Payer: BLUE CROSS/BLUE SHIELD

## 2015-08-18 DIAGNOSIS — O9989 Other specified diseases and conditions complicating pregnancy, childbirth and the puerperium: Secondary | ICD-10-CM

## 2015-08-18 DIAGNOSIS — O26899 Other specified pregnancy related conditions, unspecified trimester: Secondary | ICD-10-CM

## 2015-08-18 DIAGNOSIS — Z3A01 Less than 8 weeks gestation of pregnancy: Secondary | ICD-10-CM | POA: Insufficient documentation

## 2015-08-18 DIAGNOSIS — Z87891 Personal history of nicotine dependence: Secondary | ICD-10-CM | POA: Insufficient documentation

## 2015-08-18 DIAGNOSIS — R109 Unspecified abdominal pain: Secondary | ICD-10-CM | POA: Diagnosis not present

## 2015-08-18 DIAGNOSIS — Z91018 Allergy to other foods: Secondary | ICD-10-CM | POA: Insufficient documentation

## 2015-08-18 DIAGNOSIS — O09511 Supervision of elderly primigravida, first trimester: Secondary | ICD-10-CM | POA: Diagnosis not present

## 2015-08-18 DIAGNOSIS — O3680X0 Pregnancy with inconclusive fetal viability, not applicable or unspecified: Secondary | ICD-10-CM

## 2015-08-18 LAB — HCG, QUANTITATIVE, PREGNANCY: hCG, Beta Chain, Quant, S: 2969 m[IU]/mL — ABNORMAL HIGH (ref <5)

## 2015-08-18 NOTE — MAU Note (Signed)
Is fine. No bleeding. Still cramping really bad in her stomach

## 2015-08-18 NOTE — Discharge Instructions (Signed)

## 2015-08-18 NOTE — MAU Provider Note (Signed)
History   [id number]   Chief Complaint  Patient presents with  . Follow-up    HPI Rebecca Spence is a 35 y.o. female G3P2002 here for follow-up BHCG.  Upon review of the records patient was first seen on 7/26 for abdominal pain. BHCG on that day was 1746. Ultrasound showed "probable tiny IUGS" & no adnexal mass.  GC/CT and wet prep were collected. Results were negative. Pt discharged home to return for f/u bhcg. Pt denies vaginal bleeding. Reports continued abdominal cramping same as previous visit. All other systems negative.   Patient's last menstrual period was 07/09/2015.  OB History  Gravida Para Term Preterm AB Living  3 2 2  0   2  SAB TAB Ectopic Multiple Live Births          2    # Outcome Date GA Lbr Len/2nd Weight Sex Delivery Anes PTL Lv  3 Current           2 Term 10/21/11 [redacted]w[redacted]d 06:09 / 00:19 7 lb 15 oz (3.6 kg) M Vag-Spont EPI  LIV     Birth Comments: na  1 Term 2006 [redacted]w[redacted]d  5 lb 9 oz (2.523 kg) M Vag-Spont   LIV     Birth Comments: PIH      Past Medical History:  Diagnosis Date  . Bilateral ovarian cysts   . Headache(784.0)   . Pregnancy induced hypertension     Family History  Problem Relation Age of Onset  . Diabetes Mother   . Hypertension Mother   . Diabetes Maternal Grandmother   . Hyperlipidemia Maternal Grandmother   . Anesthesia problems Neg Hx   . Hypotension Neg Hx   . Malignant hyperthermia Neg Hx   . Pseudochol deficiency Neg Hx     Social History   Social History  . Marital status: Single    Spouse name: N/A  . Number of children: N/A  . Years of education: N/A   Social History Main Topics  . Smoking status: Former Smoker    Types: Cigarettes  . Smokeless tobacco: Never Used  . Alcohol use No     Comment: socially-not with pregnancy  . Drug use:     Types: Marijuana     Comment: Last use: last week  . Sexual activity: Yes    Birth control/ protection: None     Comment: "Tonight" wants tubal   Other Topics Concern  .  Not on file   Social History Narrative  . No narrative on file    Allergies  Allergen Reactions  . Orange Anaphylaxis and Hives    "My throat starts closing up, my tongue, lips started swelling & burning    No current facility-administered medications on file prior to encounter.    Current Outpatient Prescriptions on File Prior to Encounter  Medication Sig Dispense Refill  . albuterol (PROVENTIL HFA;VENTOLIN HFA) 108 (90 BASE) MCG/ACT inhaler Inhale 2 puffs into the lungs every 4 (four) hours as needed for wheezing or shortness of breath. 1 Inhaler 0  . labetalol (NORMODYNE) 200 MG tablet Take 1 tablet (200 mg total) by mouth 2 (two) times daily. 60 tablet 3     Physical Exam   Vitals:   08/18/15 1121  BP: 124/75  Pulse: 82  Resp: 16  Temp: 98.5 F (36.9 C)  TempSrc: Oral    Physical Exam  Constitutional: She is oriented to person, place, and time. She appears well-developed and well-nourished. No distress.  HENT:  Head: Normocephalic  and atraumatic.  Eyes: Conjunctivae are normal. Right eye exhibits no discharge. Left eye exhibits no discharge.  Respiratory: Effort normal. No respiratory distress.  GI: Soft. She exhibits no distension. There is no tenderness. There is no rebound.  Musculoskeletal: Normal range of motion.  Neurological: She is alert and oriented to person, place, and time.  Skin: She is not diaphoretic.  Psychiatric: She has a normal mood and affect. Her behavior is normal. Judgment and thought content normal.    MAU Course  Procedures Results for orders placed or performed during the hospital encounter of 08/18/15 (from the past 24 hour(s))  hCG, quantitative, pregnancy     Status: Abnormal   Collection Time: 08/18/15 11:06 AM  Result Value Ref Range   hCG, Beta Chain, Quant, S 2,969 (H) <5 mIU/mL   US Ob Transvaginal  Result Date: 08/18/2015 CLINICAL DATA:  Abdominal pain and cramping in a first trimester pregnancy. EXAM: OBSTETRIC <14 WK [place]  AND TRANSVAGINAL OB US TECHNIQUE: Both transabdominal and transvaginal ultrasound examinations were performed for complete evaluation of the gestation as well as the maternal uterus, adnexal regions, and pelvic cul-de-sac. Transvaginal technique was performed to assess early pregnancy. COMPARISON:  None. FINDINGS: Intrauterine gestational sac: Present Yolk sac:  Probable tiny yolk sac is present. Embryo:  Not seen. MSD: 0.51 cm  mm   5 w   2  d Subchorionic hemorrhage:  None visualized. Maternal uterus/adnexae: 2.6 x 1.5 x 2.9 cm is seen within the anterior vaginal wall. Several subserosal and intramural less than 1.5 cm myometrial masses are seen, likely representing fibroids. Right ovary has normal appearance. There is a 1.0 x 0.6 x 0.9 cm cystic structure adjacent to the left ovary within the left adnexa. IMPRESSION: Intrauterine gestational sac, which may represent early normal intrauterine pregnancy, corresponding to 5 weeks 2 days gestation. Probable early intrauterine gestational sac, but no definite yolk sac, fetal pole, or cardiac activity yet visualized. Recommend follow-up quantitative B-HCG levels and follow-up US in 14 days to confirm and assess viability. This recommendation follows SRU consensus guidelines: Diagnostic Criteria for Nonviable Pregnancy Early in the First Trimester. [employer] Med 2013; C5999891. 1 cm left adnexal cyst, which may represent a paraovarian cyst. In the settings of early pregnancy without visualization of intrauterine embryo however short-term follow-up is warranted to exclude ectopic pregnancy. This structure does not have a sonographic appearance suggestive of ectopic pregnancy on today's exam. Intramural and subserosal uterine fibroids, measuring less than 1.5 cm. Electronically Signed   By: Fidela Salisbury M.D.   On: 08/18/2015 15:25   MDM Component     Latest Ref Rng & Units 08/16/2015 08/18/2015  HCG, Beta Chain, Quant, S     <5 mIU/mL 1,746 (H) 2,969 (H)    70% increase in BHCG Will repeat BHCG d/t pt's pain Ultrasound shows probable IUGS with probably yolk sac & left adnexal cyst measuring 1 cm  Assessment and Plan  35 y.o. [id number] at [redacted]w[redacted]d wks Pregnancy Follow-up BHCG Pregnancy of Unknown Location  P: Discharge home Continue ectopic precautions Return Sunday for repeat BHCG  Jorje Guild, NP 08/18/2015 12:30 PM

## 2015-08-21 ENCOUNTER — Inpatient Hospital Stay (HOSPITAL_COMMUNITY): Payer: BLUE CROSS/BLUE SHIELD

## 2015-08-21 ENCOUNTER — Inpatient Hospital Stay (HOSPITAL_COMMUNITY)
Admission: AD | Admit: 2015-08-21 | Discharge: 2015-08-21 | Disposition: A | Discharge status: Home / Self Care | Payer: BLUE CROSS/BLUE SHIELD | Source: Ambulatory Visit | Attending: Obstetrics and Gynecology | Admitting: Obstetrics and Gynecology

## 2015-08-21 ENCOUNTER — Encounter (HOSPITAL_COMMUNITY): Payer: Self-pay | Admitting: *Deleted

## 2015-08-21 DIAGNOSIS — O10911 Unspecified pre-existing hypertension complicating pregnancy, first trimester: Secondary | ICD-10-CM

## 2015-08-21 DIAGNOSIS — Z87891 Personal history of nicotine dependence: Secondary | ICD-10-CM | POA: Diagnosis not present

## 2015-08-21 DIAGNOSIS — O209 Hemorrhage in early pregnancy, unspecified: Secondary | ICD-10-CM | POA: Diagnosis not present

## 2015-08-21 DIAGNOSIS — R1032 Left lower quadrant pain: Secondary | ICD-10-CM

## 2015-08-21 DIAGNOSIS — K5900 Constipation, unspecified: Secondary | ICD-10-CM | POA: Diagnosis not present

## 2015-08-21 DIAGNOSIS — O26899 Other specified pregnancy related conditions, unspecified trimester: Secondary | ICD-10-CM

## 2015-08-21 DIAGNOSIS — O10011 Pre-existing essential hypertension complicating pregnancy, first trimester: Secondary | ICD-10-CM | POA: Diagnosis not present

## 2015-08-21 DIAGNOSIS — O210 Mild hyperemesis gravidarum: Secondary | ICD-10-CM | POA: Insufficient documentation

## 2015-08-21 DIAGNOSIS — R109 Unspecified abdominal pain: Secondary | ICD-10-CM

## 2015-08-21 DIAGNOSIS — O219 Vomiting of pregnancy, unspecified: Secondary | ICD-10-CM | POA: Diagnosis not present

## 2015-08-21 DIAGNOSIS — O4691 Antepartum hemorrhage, unspecified, first trimester: Secondary | ICD-10-CM | POA: Diagnosis not present

## 2015-08-21 DIAGNOSIS — O99611 Diseases of the digestive system complicating pregnancy, first trimester: Secondary | ICD-10-CM

## 2015-08-21 DIAGNOSIS — Z3A01 Less than 8 weeks gestation of pregnancy: Secondary | ICD-10-CM | POA: Diagnosis not present

## 2015-08-21 DIAGNOSIS — Z3491 Encounter for supervision of normal pregnancy, unspecified, first trimester: Secondary | ICD-10-CM

## 2015-08-21 LAB — CBC
HCT: 33.6 % — ABNORMAL LOW (ref 36.0–46.0)
Hemoglobin: 11.2 g/dL — ABNORMAL LOW (ref 12.0–15.0)
MCH: 29.2 pg (ref 26.0–34.0)
MCHC: 33.3 g/dL (ref 30.0–36.0)
MCV: 87.5 fL (ref 78.0–100.0)
PLATELETS: 270 10*3/uL (ref 150–400)
RBC: 3.84 MIL/uL — ABNORMAL LOW (ref 3.87–5.11)
RDW: 15.3 % (ref 11.5–15.5)
WBC: 11.2 10*3/uL — AB (ref 4.0–10.5)

## 2015-08-21 LAB — HCG, QUANTITATIVE, PREGNANCY: HCG, BETA CHAIN, QUANT, S: 4853 m[IU]/mL — AB (ref <5)

## 2015-08-21 MED ORDER — PROMETHAZINE HCL 25 MG PO TABS: 12.5000 mg | ORAL_TABLET | Freq: Four times a day (QID) | ORAL | 2 refills | Status: DC | PRN

## 2015-08-21 MED ORDER — DOCUSATE SODIUM 100 MG PO CAPS: 100.0000 mg | ORAL_CAPSULE | Freq: Two times a day (BID) | ORAL | 2 refills | Status: DC | PRN

## 2015-08-21 MED ORDER — LABETALOL HCL 200 MG PO TABS: 200.0000 mg | ORAL_TABLET | Freq: Two times a day (BID) | ORAL | 3 refills | Status: DC

## 2015-08-21 MED ORDER — OXYCODONE-ACETAMINOPHEN 5-325 MG PO TABS: 1.0000 | ORAL_TABLET | ORAL | 0 refills | Status: DC | PRN

## 2015-08-21 MED ORDER — HYDROMORPHONE HCL 2 MG/ML IJ SOLN
2.0000 mg | Freq: Once | INTRAMUSCULAR | Status: AC
Start: 2015-08-21 — End: 2015-08-21
  Administered 2015-08-21: 2 mg via INTRAMUSCULAR
  Filled 2015-08-21: qty 1

## 2015-08-21 MED ORDER — POLYETHYLENE GLYCOL 3350 17 GM/SCOOP PO POWD: 17.0000 g | Freq: Every day | ORAL | 0 refills | Status: DC

## 2015-08-21 MED ORDER — OXYCODONE-ACETAMINOPHEN 5-325 MG PO TABS
2.0000 | ORAL_TABLET | Freq: Once | ORAL | Status: AC
  Administered 2015-08-21: 2 via ORAL
  Filled 2015-08-21: qty 2

## 2015-08-21 MED ORDER — ONDANSETRON 8 MG PO TBDP
8.0000 mg | ORAL_TABLET | Freq: Once | ORAL | Status: AC
  Administered 2015-08-21: 8 mg via ORAL
  Filled 2015-08-21: qty 1

## 2015-08-21 NOTE — MAU Note (Signed)
Noted blood on tissue last night when wiped, and again this morning.  Cramping has gotten worse.

## 2015-08-21 NOTE — Discharge Instructions (Signed)
Abdominal Pain During Pregnancy Abdominal pain is common in pregnancy. Most of the time, it does not cause harm. There are many causes of abdominal pain. Some causes are more serious than others. Some of the causes of abdominal pain in pregnancy are easily diagnosed. Occasionally, the diagnosis takes time to understand. Other times, the cause is not determined. Abdominal pain can be a sign that something is very wrong with the pregnancy, or the pain may have nothing to do with the pregnancy at all. For this reason, always tell your health care provider if you have any abdominal discomfort. HOME CARE INSTRUCTIONS  Monitor your abdominal pain for any changes. The following actions may help to alleviate any discomfort you are experiencing:  Do not have sexual intercourse or put anything in your vagina until your symptoms go away completely.  Get plenty of rest until your pain improves.  Drink clear fluids if you feel nauseous. Avoid solid food as long as you are uncomfortable or nauseous.  Only take over-the-counter or prescription medicine as directed by your health care provider.  Keep all follow-up appointments with your health care provider. SEEK IMMEDIATE MEDICAL CARE IF:  You are bleeding, leaking fluid, or passing tissue from the vagina.  You have increasing pain or cramping.  You have persistent vomiting.  You have painful or bloody urination.  You have a fever.  You notice a decrease in your baby's movements.  You have extreme weakness or feel faint.  You have shortness of breath, with or without abdominal pain.  You develop a severe headache with abdominal pain.  You have abnormal vaginal discharge with abdominal pain.  You have persistent diarrhea.  You have abdominal pain that continues even after rest, or gets worse. MAKE SURE YOU:   Understand these instructions.  Will watch your condition.  Will get help right away if you are not doing well or get worse.     This information is not intended to replace advice given to you by your health care provider. Make sure you discuss any questions you have with your health care provider.   Document Released: 01/07/2005 Document Revised: 10/28/2012 Document Reviewed: 08/06/2012 Elsevier Interactive Patient Education 2016 [employer].   Constipation, Adult Constipation is when a person has fewer than three bowel movements a week, has difficulty having a bowel movement, or has stools that are dry, hard, or larger than normal. As people grow older, constipation is more common. A low-fiber diet, not taking in enough fluids, and taking certain medicines may make constipation worse.  CAUSES   Certain medicines, such as antidepressants, pain medicine, iron supplements, antacids, and water pills.   Certain diseases, such as diabetes, irritable bowel syndrome (IBS), thyroid disease, or depression.   Not drinking enough water.   Not eating enough fiber-rich foods.   Stress or travel.   Lack of physical activity or exercise.   Ignoring the urge to have a bowel movement.   Using laxatives too much.  SIGNS AND SYMPTOMS   Having fewer than three bowel movements a week.   Straining to have a bowel movement.   Having stools that are hard, dry, or larger than normal.   Feeling full or bloated.   Pain in the lower abdomen.   Not feeling relief after having a bowel movement.  DIAGNOSIS  Your health care provider will take a medical history and perform a physical exam. Further testing may be done for severe constipation. Some tests may include:  A barium enema  X-ray to examine your rectum, colon, and, sometimes, your small intestine.   A sigmoidoscopy to examine your lower colon.   A colonoscopy to examine your entire colon. TREATMENT  Treatment will depend on the severity of your constipation and what is causing it. Some dietary treatments include drinking more fluids and eating more  fiber-rich foods. Lifestyle treatments may include regular exercise. If these diet and lifestyle recommendations do not help, your health care provider may recommend taking over-the-counter laxative medicines to help you have bowel movements. Prescription medicines may be prescribed if over-the-counter medicines do not work.  HOME CARE INSTRUCTIONS   Eat foods that have a lot of fiber, such as fruits, vegetables, whole grains, and beans.  Limit foods high in fat and processed sugars, such as french fries, hamburgers, cookies, candies, and soda.   A fiber supplement may be added to your diet if you cannot get enough fiber from foods.   Drink enough fluids to keep your urine clear or pale yellow.   Exercise regularly or as directed by your health care provider.   Go to the restroom when you have the urge to go. Do not hold it.   Only take over-the-counter or prescription medicines as directed by your health care provider. Do not take other medicines for constipation without talking to your health care provider first.  [place] IF:   You have bright red blood in your stool.   Your constipation lasts for more than 4 days or gets worse.   You have abdominal or rectal pain.   You have thin, pencil-like stools.   You have unexplained weight loss. MAKE SURE YOU:   Understand these instructions.  Will watch your condition.  Will get help right away if you are not doing well or get worse.   This information is not intended to replace advice given to you by your health care provider. Make sure you discuss any questions you have with your health care provider.   Document Released: 10/06/2003 Document Revised: 01/28/2014 Document Reviewed: 10/19/2012 Elsevier Interactive Patient Education 2016 Else[employer] Inc[employer]   Morning Sickness Morning sickness is when you feel sick to your stomach (nauseous) during pregnancy. This nauseous feeling may or may not come with  vomiting. It often occurs in the morning but can be a problem any time of day. Morning sickness is most common during the first trimester, but it may continue throughout pregnancy. While morning sickness is unpleasant, it is usually harmless unless you develop severe and continual vomiting (hyperemesis gravidarum). This condition requires more intense treatment.  CAUSES  The cause of morning sickness is not completely known but seems to be related to normal hormonal changes that occur in pregnancy. RISK FACTORS You are at greater risk if you:  Experienced nausea or vomiting before your pregnancy.  Had morning sickness during a previous pregnancy.  Are pregnant with more than one baby, such as twins. TREATMENT  Do not use any medicines (prescription, over-the-counter, or herbal) for morning sickness without first talking to your health care provider. Your health care provider may prescribe or recommend:  Vitamin B6 supplements.  Anti-nausea medicines.  The herbal medicine ginger. HOME CARE INSTRUCTIONS   Only take over-the-counter or prescription medicines as directed by your health care provider.  Taking multivitamins before getting pregnant can prevent or decrease the severity of morning sickness in most women.  Eat a piece of dry toast or unsalted crackers before getting out of bed in the morning.  Eat  five or six small meals a day.  Eat dry and bland foods (rice, baked potato). Foods high in carbohydrates are often helpful.  Do not drink liquids with your meals. Drink liquids between meals.  Avoid greasy, fatty, and spicy foods.  Get someone to cook for you if the smell of any food causes nausea and vomiting.  If you feel nauseous after taking prenatal vitamins, take the vitamins at night or with a snack.  Snack on protein foods (nuts, yogurt, cheese) between meals if you are hungry.  Eat unsweetened gelatins for desserts.  Wearing an acupressure wristband (worn for sea  sickness) may be helpful.  Acupuncture may be helpful.  Do not smoke.  Get a humidifier to keep the air in your house free of odors.  Get plenty of fresh air. SEEK MEDICAL CARE IF:   Your home remedies are not working, and you need medicine.  You feel dizzy or lightheaded.  You are losing weight. SEEK IMMEDIATE MEDICAL CARE IF:   You have persistent and uncontrolled nausea and vomiting.  You pass out (faint). MAKE SURE YOU:  Understand these instructions.  Will watch your condition.  Will get help right away if you are not doing well or get worse.   This information is not intended to replace advice given to you by your health care provider. Make sure you discuss any questions you have with your health care provider.   Document Released: 02/28/2006 Document Revised: 01/12/2013 Document Reviewed: 06/24/2012 Elsevier Interactive Patient Education [employer].

## 2015-08-21 NOTE — MAU Provider Note (Signed)
Chief Complaint: Follow-up   None     SUBJECTIVE HPI: Arfa Weissman is a 35 y.o. G3P2002 at [redacted]w[redacted]d by LMP who presents to maternity admissions reporting increased abdominal pain and onset of light bleeding today.  She was initially seen on 7/26 and had inappropriate rise in quant hcg from 1746 to 2969 in 48 hours.  US showed gestational sac and probable yolk sac with small 1 cm ovarian/paraovarian cyst on left.  Pt scheduled to return today for repeat quant hcg but reports her pain is worse and the light bleeding is new onset. She has tried rest, heat, and Tylenol for pain but they have not helped. Last dose of Tylenol was > 4 hours ago.  While in MAU she reports onset of nausea with vomiting x 2-3.  She reports bowel movements daily but they are hard and uncomfortable when she goes.  She has not taken anything for constipation.  She reports she was prescribed labetalol for [facility] but cannot afford it at this time. She denies vaginal itching/burning, urinary symptoms, h/a, dizziness, or fever/chills.     HPI  Past Medical History:  Diagnosis Date  . Bilateral ovarian cysts   . Headache(784.0)   . Pregnancy induced hypertension    Past Surgical History:  Procedure Laterality Date  . NO PAST SURGERIES     Social History   Social History  . Marital status: Single    Spouse name: N/A  . Number of children: N/A  . Years of education: N/A   Occupational History  . Not on file.   Social History Main Topics  . Smoking status: Former Smoker    Types: Cigarettes  . Smokeless tobacco: Never Used  . Alcohol use No     Comment: socially-not with pregnancy  . Drug use:     Types: Marijuana     Comment: Last use: last week  . Sexual activity: Yes    Birth control/ protection: None     Comment: "Tonight" wants tubal   Other Topics Concern  . Not on file   Social History Narrative  . No narrative on file   No current facility-administered medications on file prior to encounter.     Current Outpatient Prescriptions on File Prior to Encounter  Medication Sig Dispense Refill  . albuterol (PROVENTIL HFA;VENTOLIN HFA) 108 (90 BASE) MCG/ACT inhaler Inhale 2 puffs into the lungs every 4 (four) hours as needed for wheezing or shortness of breath. 1 Inhaler 0   Allergies  Allergen Reactions  . Orange Anaphylaxis and Hives    "My throat starts closing up, my tongue, lips started swelling & burning    ROS:  Review of Systems  Constitutional: Negative for chills, fatigue and fever.  Respiratory: Negative for shortness of breath.   Cardiovascular: Negative for chest pain.  Gastrointestinal: Positive for abdominal pain, nausea and vomiting.  Genitourinary: Positive for pelvic pain and vaginal bleeding. Negative for difficulty urinating, dysuria, flank pain, vaginal discharge and vaginal pain.  Neurological: Negative for dizziness and headaches.  Psychiatric/Behavioral: Negative.      I have reviewed patient's Past Medical Hx, Surgical Hx, Family Hx, Social Hx, medications and allergies.   Physical Exam   Patient Vitals for the past 24 hrs:  BP Temp Temp src Pulse Resp  08/21/15 1529 166/85 - - 78 18  08/21/15 1005 128/87 98.2 F (36.8 C) Oral 81 18    Constitutional: Well-developed, well-nourished female in no acute distress.  Cardiovascular: normal rate Respiratory: normal effort  GI: Abd soft, significant LLQ pain, in left anexal region, abdomen soft, no RLQ tenderness/rebound tenderness/guarding, Pos BS x 4 MS: Extremities nontender, no edema, normal ROM Neurologic: Alert and oriented x 4.  GU: Neg CVAT.  PELVIC EXAM: Deferred, done on 7/26.  Pad visualized and scant bleeding on small pad x 1 while pt in MAU    LAB RESULTS Results for orders placed or performed during the hospital encounter of 08/21/15 (from the past 24 hour(s))  hCG, quantitative, pregnancy     Status: Abnormal   Collection Time: 08/21/15  9:40 AM  Result Value Ref Range   hCG, Beta  Chain, Quant, S 4,853 (H) <5 mIU/mL  CBC     Status: Abnormal   Collection Time: 08/21/15  9:40 AM  Result Value Ref Range   WBC 11.2 (H) 4.0 - 10.5 K/uL   RBC 3.84 (L) 3.87 - 5.11 MIL/uL   Hemoglobin 11.2 (L) 12.0 - 15.0 g/dL   HCT 33.6 (L) 36.0 - 46.0 %   MCV 87.5 78.0 - 100.0 fL   MCH 29.2 26.0 - 34.0 pg   MCHC 33.3 30.0 - 36.0 g/dL   RDW 15.3 11.5 - 15.5 %   Platelets 270 150 - 400 K/uL       IMAGING US Ob Comp Less 14 Wks  Result Date: 08/16/2015 CLINICAL DATA:  Pelvic pain and cramping in first trimester pregnancy. EXAM: OBSTETRIC <14 WK [place] AND TRANSVAGINAL OB US TECHNIQUE: Both transabdominal and transvaginal ultrasound examinations were performed for complete evaluation of the gestation as well as the maternal uterus, adnexal regions, and pelvic cul-de-sac. Transvaginal technique was performed to assess early pregnancy. COMPARISON:  None. FINDINGS: Intrauterine gestational sac: Probable single tiny gestational sac visualized Yolk sac:  Not visualized Embryo:  Not visualized MSD: 3  mm   5 w   0  d Subchorionic hemorrhage:  None visualized. Maternal uterus/adnexae: Both ovaries are normal in appearance. No adnexal mass identified. Tiny amount of simple free fluid seen in cul-de-sac. IMPRESSION: Probable single early intrauterine gestational sac, but no yolk sac, fetal pole, or cardiac activity yet visualized. Recommend follow-up quantitative B-HCG levels and follow-up US in 14 days to confirm and assess viability. This recommendation follows SRU consensus guidelines: Diagnostic Criteria for Nonviable Pregnancy Early in the First Trimester. [place] Med [id number]:[id number]. Electronically Signed   By: Earle Gell M.D.   On: 08/16/2015 11:57  US Ob Transvaginal  Result Date: 08/21/2015 CLINICAL DATA:  Early pregnancy. Assess viability. Bleeding since last night. Quantitative beta HCG is 4,853.By LMP patient is 6 weeks 1 day. EDC by LMP is 04/14/2016. Gravida 3 para 2. EXAM: TRANSVAGINAL OB  ULTRASOUND TECHNIQUE: Transvaginal ultrasound was performed for complete evaluation of the gestation as well as the maternal uterus, adnexal regions, and pelvic cul-de-sac. COMPARISON:  08/18/2015 FINDINGS: Intrauterine gestational sac: Present Yolk sac:  Present Embryo:  Not seen Cardiac Activity: Not Heart Rate: Not applicable bpm MSD: 8.0  mm   5 w   4  d Subchorionic hemorrhage:  None visualized. Maternal uterus/adnexae: Right ovary is 3.2 x 2.3 x 2.3 cm. Left ovary is 2.8 x 2.3 x 2 cm. Trace free pelvic fluid noted. Uterine fibroids are again noted. Study quality is degraded by patient's pain level. IMPRESSION: 1. Intrauterine gestational sac. 2. No embryo yet visible. 3. No subchorionic hemorrhage identified. Electronically Signed   By: Nolon Nations M.D.   On: 08/21/2015 12:19  US Ob Transvaginal  Result Date: 08/18/2015 CLINICAL  DATA:  Abdominal pain and cramping in a first trimester pregnancy. EXAM: OBSTETRIC <14 WK [place] AND TRANSVAGINAL OB US TECHNIQUE: Both transabdominal and transvaginal ultrasound examinations were performed for complete evaluation of the gestation as well as the maternal uterus, adnexal regions, and pelvic cul-de-sac. Transvaginal technique was performed to assess early pregnancy. COMPARISON:  None. FINDINGS: Intrauterine gestational sac: Present Yolk sac:  Probable tiny yolk sac is present. Embryo:  Not seen. MSD: 0.51 cm  mm   5 w   2  d Subchorionic hemorrhage:  None visualized. Maternal uterus/adnexae: 2.6 x 1.5 x 2.9 cm is seen within the anterior vaginal wall. Several subserosal and intramural less than 1.5 cm myometrial masses are seen, likely representing fibroids. Right ovary has normal appearance. There is a 1.0 x 0.6 x 0.9 cm cystic structure adjacent to the left ovary within the left adnexa. IMPRESSION: Intrauterine gestational sac, which may represent early normal intrauterine pregnancy, corresponding to 5 weeks 2 days gestation. Probable early intrauterine  gestational sac, but no definite yolk sac, fetal pole, or cardiac activity yet visualized. Recommend follow-up quantitative B-HCG levels and follow-up US in 14 days to confirm and assess viability. This recommendation follows SRU consensus guidelines: Diagnostic Criteria for Nonviable Pregnancy Early in the First Trimester. [place] Med 2013; [id number]. 1 cm left adnexal cyst, which may represent a paraovarian cyst. In the settings of early pregnancy without visualization of intrauterine embryo however short-term follow-up is warranted to exclude ectopic pregnancy. This structure does not have a sonographic appearance suggestive of ectopic pregnancy on today's exam. Intramural and subserosal uterine fibroids, measuring less than 1.5 cm. Electronically Signed   By: Fidela Salisbury M.D.   On: 08/18/2015 15:25  US Ob Transvaginal  Result Date: 08/16/2015 CLINICAL DATA:  Pelvic pain and cramping in first trimester pregnancy. EXAM: OBSTETRIC <14 WK [place] AND TRANSVAGINAL OB US TECHNIQUE: Both transabdominal and transvaginal ultrasound examinations were performed for complete evaluation of the gestation as well as the maternal uterus, adnexal regions, and pelvic cul-de-sac. Transvaginal technique was performed to assess early pregnancy. COMPARISON:  None. FINDINGS: Intrauterine gestational sac: Probable single tiny gestational sac visualized Yolk sac:  Not visualized Embryo:  Not visualized MSD: 3  mm   5 w   0  d Subchorionic hemorrhage:  None visualized. Maternal uterus/adnexae: Both ovaries are normal in appearance. No adnexal mass identified. Tiny amount of simple free fluid seen in cul-de-sac. IMPRESSION: Probable single early intrauterine gestational sac, but no yolk sac, fetal pole, or cardiac activity yet visualized. Recommend follow-up quantitative B-HCG levels and follow-up US in 14 days to confirm and assess viability. This recommendation follows SRU consensus guidelines: Diagnostic Criteria for  Nonviable Pregnancy Early in the First Trimester. [employer] Med [id number]:[id number]. Electronically Signed   By: Earle Gell M.D.   On: 08/16/2015 11:57   MAU Management/MDM: Ordered labs and reviewed results.   Quant  hcg continues to rise at ~60% in >48 hours so low end of normal range for IUP.  Pt pain increased and bleeding is new so sent for repeat US today.  Percocet 5/325 x 2 tabs given for pain prior to [place].  [place] results reviewed with Dr Elly Modena.  IUP is clear with visible yolk sac, but no fetal pole yet.  No free fluid or sizeable cyst noted.  Pt rocking in bed and crying with pain following [place] so Dilaudid 2 mg IM given.  Pt vomited in MAU x 2 so Zofran 8 mg ODT  x 1 dose given.  No changes to pt light bleeding while in MAU.  Pt does have constipation/hard stools recently.  So dx is constipation vs failed pregnancy/early miscarriage not evident on imaging.  Will treat constipation with Colace and Miralax, increase PO fluids and fiber.  Offered pt options to lower cost of labetalol and encouraged her to try a few more pharmacies for lowest cost.  Rx for Percocet 5/325, take 1-2 tabs Q6 hours PRN x 10 tabs.  F/U in [place] for prenatal care or return to MAU if pain persists or worsens, or if vaginal bleeding becomes heavy. Pt stable at time of discharge.  ASSESSMENT 1. Vaginal bleeding in pregnancy, first trimester   2. Abdominal pain complicating pregnancy   3. Normal IUP (intrauterine pregnancy) on prenatal ultrasound, first trimester   4. Abdominal pain, LLQ   5. Constipation during pregnancy in first trimester   6. Nausea and vomiting during pregnancy prior to [redacted] weeks gestation   7. Chronic hypertension complicating or reason for care during pregnancy, first trimester     PLAN Discharge home with bleeding/first trimester/abdominal pain precautions    Medication List    TAKE these medications   albuterol 108 (90 Base) MCG/ACT inhaler Commonly known as:  PROVENTIL HFA;VENTOLIN HFA Inhale 2  puffs into the lungs every 4 (four) hours as needed for wheezing or shortness of breath.   docusate sodium 100 MG capsule Commonly known as:  COLACE Take 1 capsule (100 mg total) by mouth 2 (two) times daily as needed.   labetalol 200 MG tablet Commonly known as:  NORMODYNE Take 1 tablet (200 mg total) by mouth 2 (two) times daily.   oxyCODONE-acetaminophen 5-325 MG tablet Commonly known as:  PERCOCET/ROXICET Take 1 tablet by mouth every 4 (four) hours as needed for severe pain.   polyethylene glycol powder powder Commonly known as:  GLYCOLAX/MIRALAX Take 17 g by mouth daily.   promethazine 25 MG tablet Commonly known as:  PHENERGAN Take 0.5-1 tablets (12.5-25 mg total) by mouth every 6 (six) hours as needed for nausea or vomiting.      Follow-up [facility] for [facility]. Schedule an appointment as soon as possible for a visit today.   Specialty:  Obstetrics and Gynecology Why:  Return to MAU as needed for emergencies Contact information: [place] Certified Nurse-Midwife 08/21/2015  4:43 PM

## 2015-08-27 ENCOUNTER — Encounter (HOSPITAL_COMMUNITY): Payer: Self-pay | Admitting: *Deleted

## 2015-08-27 ENCOUNTER — Inpatient Hospital Stay (HOSPITAL_COMMUNITY)
Admission: AD | Admit: 2015-08-27 | Discharge: 2015-08-27 | Disposition: A | Discharge status: Home / Self Care | Payer: BLUE CROSS/BLUE SHIELD | Source: Ambulatory Visit | Attending: Obstetrics & Gynecology | Admitting: Obstetrics & Gynecology

## 2015-08-27 ENCOUNTER — Inpatient Hospital Stay (HOSPITAL_COMMUNITY): Payer: BLUE CROSS/BLUE SHIELD

## 2015-08-27 DIAGNOSIS — D252 Subserosal leiomyoma of uterus: Secondary | ICD-10-CM | POA: Diagnosis not present

## 2015-08-27 DIAGNOSIS — O418X10 Other specified disorders of amniotic fluid and membranes, first trimester, not applicable or unspecified: Secondary | ICD-10-CM

## 2015-08-27 DIAGNOSIS — Z3A01 Less than 8 weeks gestation of pregnancy: Secondary | ICD-10-CM | POA: Diagnosis not present

## 2015-08-27 DIAGNOSIS — O3482 Maternal care for other abnormalities of pelvic organs, second trimester: Secondary | ICD-10-CM | POA: Insufficient documentation

## 2015-08-27 DIAGNOSIS — O200 Threatened abortion: Secondary | ICD-10-CM | POA: Diagnosis not present

## 2015-08-27 DIAGNOSIS — O3412 Maternal care for benign tumor of corpus uteri, second trimester: Secondary | ICD-10-CM | POA: Diagnosis not present

## 2015-08-27 DIAGNOSIS — N939 Abnormal uterine and vaginal bleeding, unspecified: Secondary | ICD-10-CM | POA: Diagnosis present

## 2015-08-27 DIAGNOSIS — O209 Hemorrhage in early pregnancy, unspecified: Secondary | ICD-10-CM

## 2015-08-27 DIAGNOSIS — O2341 Unspecified infection of urinary tract in pregnancy, first trimester: Secondary | ICD-10-CM | POA: Diagnosis not present

## 2015-08-27 DIAGNOSIS — O43891 Other placental disorders, first trimester: Secondary | ICD-10-CM | POA: Diagnosis not present

## 2015-08-27 DIAGNOSIS — N83202 Unspecified ovarian cyst, left side: Secondary | ICD-10-CM | POA: Diagnosis not present

## 2015-08-27 DIAGNOSIS — O468X1 Other antepartum hemorrhage, first trimester: Secondary | ICD-10-CM

## 2015-08-27 DIAGNOSIS — O3680X0 Pregnancy with inconclusive fetal viability, not applicable or unspecified: Secondary | ICD-10-CM

## 2015-08-27 LAB — URINALYSIS, ROUTINE W REFLEX MICROSCOPIC
Bilirubin Urine: NEGATIVE
Glucose, UA: NEGATIVE mg/dL
Ketones, ur: NEGATIVE mg/dL
NITRITE: POSITIVE — AB
PH: 6 (ref 5.0–8.0)
Protein, ur: 30 mg/dL — AB
SPECIFIC GRAVITY, URINE: 1.025 (ref 1.005–1.030)

## 2015-08-27 LAB — URINE MICROSCOPIC-ADD ON

## 2015-08-27 LAB — CBC
HCT: 31.5 % — ABNORMAL LOW (ref 36.0–46.0)
Hemoglobin: 10.6 g/dL — ABNORMAL LOW (ref 12.0–15.0)
MCH: 29.9 pg (ref 26.0–34.0)
MCHC: 33.7 g/dL (ref 30.0–36.0)
MCV: 89 fL (ref 78.0–100.0)
PLATELETS: 249 10*3/uL (ref 150–400)
RBC: 3.54 MIL/uL — ABNORMAL LOW (ref 3.87–5.11)
RDW: 14.9 % (ref 11.5–15.5)
WBC: 11.1 10*3/uL — AB (ref 4.0–10.5)

## 2015-08-27 LAB — HCG, QUANTITATIVE, PREGNANCY: HCG, BETA CHAIN, QUANT, S: 8358 m[IU]/mL — AB (ref <5)

## 2015-08-27 MED ORDER — NITROFURANTOIN MONOHYD MACRO 100 MG PO CAPS: 100.0000 mg | ORAL_CAPSULE | Freq: Two times a day (BID) | ORAL | 0 refills | Status: DC

## 2015-08-27 NOTE — MAU Note (Signed)
Pt. States that she came in last week for cramping and spotting.  She states that 2 days ago she started bleeding and she has been having a moderate amount of bleeding since then. She is also having cramping on and off.

## 2015-08-27 NOTE — Discharge Instructions (Signed)
Subchorionic Hematoma °A subchorionic hematoma is a gathering of blood between the outer wall of the placenta and the inner wall of the womb (uterus). The placenta is the organ that connects the fetus to the wall of the uterus. The placenta performs the feeding, breathing (oxygen to the fetus), and waste removal (excretory work) of the fetus.  °Subchorionic hematoma is the most common abnormality found on a result from ultrasonography done during the first trimester or early second trimester of pregnancy. If there has been little or no vaginal bleeding, early small hematomas usually shrink on their own and do not affect your baby or pregnancy. The blood is gradually absorbed over 1-2 weeks. When bleeding starts later in pregnancy or the hematoma is larger or occurs in an older pregnant woman, the outcome may not be as good. Larger hematomas may get bigger, which increases the chances for miscarriage. Subchorionic hematoma also increases the risk of premature detachment of the placenta from the uterus, preterm (premature) labor, and stillbirth. °HOME CARE INSTRUCTIONS °· Stay on bed rest if your health care provider recommends this. Although bed rest will not prevent more bleeding or prevent a miscarriage, your health care provider may recommend bed rest until you are advised otherwise. °· Avoid heavy lifting (more than 10 lb [4.5 kg]), exercise, sexual intercourse, or douching as directed by your health care provider. °· Keep track of the number of pads you use each day and how soaked (saturated) they are. Write down this information. °· Do not use tampons. °· Keep all follow-up appointments as directed by your health care provider. Your health care provider may ask you to have follow-up blood tests or ultrasound tests or both. °SEEK IMMEDIATE MEDICAL CARE IF: °· You have severe cramps in your stomach, back, abdomen, or pelvis. °· You have a fever. °· You pass large clots or tissue. Save any tissue for your health  care provider to look at. °· Your bleeding increases or you become lightheaded, feel weak, or have fainting episodes. °  °This information is not intended to replace advice given to you by your health care provider. Make sure you discuss any questions you have with your health care provider. °  °Document Released: 04/24/2006 Document Revised: 01/28/2014 Document Reviewed: 08/06/2012 °Elsevier Interactive Patient Education ©2016 [employer] ° °

## 2015-08-27 NOTE — MAU Provider Note (Signed)
History   First Provider Initiated Contact with Patient 08/27/15 2310     Chief Complaint:  Vaginal Bleeding   Rebecca Spence is  35 y.o. [id number] Patient's last menstrual period was 07/09/2015.Marland Kitchen Patient is here for follow up of increased vaginal bleeding.   She is [redacted]w[redacted]d weeks gestation  by LMP.  Had abnormal rise in quant but ultrasound on 08/21/2015 did show yolk sac confirming IUP.  Since her last visit, the patient is with new complaint.     ROS Abdomin Pain: Mild cramping Vaginal bleeding: similar to period.   Passage of clots or tissue: Unsure Dizziness: None  Her previous Quantitative HCG values are:  Results for Rebecca Spence, Rebecca Spence (MRN [id number]) as of 08/28/2015 07:21  Ref. Range 08/16/2015 10:37 08/18/2015 11:06 08/21/2015 09:40  HCG, Beta Chain, Quant, S Latest Ref Range: <5 mIU/mL 1,746 (H) 2,969 (H) 4,853 (H)    Physical Exam   BP 126/78 (BP Location: Right Arm)   Pulse 77   Temp 98.6 F (37 C)   Ht 5\' 6"  (1.676 m)   LMP 07/09/2015  Constitutional: Well-nourished female in no apparent distress. No pallor Neuro: Alert and oriented 4 Cardiovascular: Normal rate Respiratory: Normal effort and rate Abdomen: Soft, nontender Gynecological Exam: examination not indicated  Labs: Results for orders placed or performed during the hospital encounter of 08/27/15 (from the past 24 hour(s))  Urinalysis, Routine w reflex microscopic (not at [facility])   Collection Time: 08/27/15  8:55 PM  Result Value Ref Range   Color, Urine YELLOW YELLOW   APPearance CLEAR CLEAR   Specific Gravity, Urine 1.025 1.005 - 1.030   pH 6.0 5.0 - 8.0   Glucose, UA NEGATIVE NEGATIVE mg/dL   Hgb urine dipstick LARGE (A) NEGATIVE   Bilirubin Urine NEGATIVE NEGATIVE   Ketones, ur NEGATIVE NEGATIVE mg/dL   Protein, ur 30 (A) NEGATIVE mg/dL   Nitrite POSITIVE (A) NEGATIVE   Leukocytes, UA TRACE (A) NEGATIVE  Urine microscopic-add on   Collection Time: 08/27/15  8:55 PM  Result Value Ref Range    Squamous Epithelial / LPF 0-5 (A) NONE SEEN   WBC, UA 0-5 0 - 5 WBC/hpf   RBC / HPF TOO NUMEROUS TO COUNT 0 - 5 RBC/hpf   Bacteria, UA MANY (A) NONE SEEN  CBC   Collection Time: 08/27/15  9:36 PM  Result Value Ref Range   WBC 11.1 (H) 4.0 - 10.5 K/uL   RBC 3.54 (L) 3.87 - 5.11 MIL/uL   Hemoglobin 10.6 (L) 12.0 - 15.0 g/dL   HCT 31.5 (L) 36.0 - 46.0 %   MCV 89.0 78.0 - 100.0 fL   MCH 29.9 26.0 - 34.0 pg   MCHC 33.7 30.0 - 36.0 g/dL   RDW 14.9 11.5 - 15.5 %   Platelets 249 150 - 400 K/uL  hCG, quantitative, pregnancy   Collection Time: 08/27/15  9:36 PM  Result Value Ref Range   hCG, Beta Chain, Quant, S 8,358 (H) <5 mIU/mL    Ultrasound Studies:   US Ob Comp Less 14 Wks  Result Date: 08/16/2015 CLINICAL DATA:  Pelvic pain and cramping in first trimester pregnancy. EXAM: OBSTETRIC <14 WK [place] AND TRANSVAGINAL OB US TECHNIQUE: Both transabdominal and transvaginal ultrasound examinations were performed for complete evaluation of the gestation as well as the maternal uterus, adnexal regions, and pelvic cul-de-sac. Transvaginal technique was performed to assess early pregnancy. COMPARISON:  None. FINDINGS: Intrauterine gestational sac: Probable single tiny gestational sac visualized Yolk sac:  Not  visualized Embryo:  Not visualized MSD: 3  mm   5 w   0  d Subchorionic hemorrhage:  None visualized. Maternal uterus/adnexae: Both ovaries are normal in appearance. No adnexal mass identified. Tiny amount of simple free fluid seen in cul-de-sac. IMPRESSION: Probable single early intrauterine gestational sac, but no yolk sac, fetal pole, or cardiac activity yet visualized. Recommend follow-up quantitative B-HCG levels and follow-up US in 14 days to confirm and assess viability. This recommendation follows SRU consensus guidelines: Diagnostic Criteria for Nonviable Pregnancy Early in the First Trimester. [place] [place] Med [id number]:[id number]. Electronically Signed   By: Earle Gell M.D.   On: 08/16/2015  11:57  US Ob Transvaginal  Result Date: 08/27/2015 CLINICAL DATA:  Pregnant patient in first-trimester pregnancy with vaginal bleeding and inconclusive fetal viability. EXAM: TRANSVAGINAL OB ULTRASOUND TECHNIQUE: Transvaginal ultrasound was performed for complete evaluation of the gestation as well as the maternal uterus, adnexal regions, and pelvic cul-de-sac. COMPARISON:  Obstetric ultrasound 08/21/2015, 728 17 and 08/16/2015. FINDINGS: Intrauterine gestational sac: Single Yolk sac:  Present. Embryo:  Now visualized. Cardiac Activity: Present. Heart Rate: 109 bpm CRL:   2.7  mm   5 w 6 d                  [place] EDC: 04/22/2016 Subchorionic hemorrhage:  Yes, small to moderate. Maternal uterus/adnexae: Both ovaries are visualized and are normal. Trace pelvic free fluid. IMPRESSION: Fetal pole has appeared in the intrauterine gestational sac in the interim with positive fetal heart tones. There is a single live intrauterine pregnancy with estimated gestational age [redacted] weeks 6 days, estimated date of delivery 04/22/2016. Small to moderate subchorionic hemorrhage. Electronically Signed   By: Jeb Levering M.D.   On: 08/27/2015 22:24   US Ob Transvaginal  Result Date: 08/21/2015 CLINICAL DATA:  Early pregnancy. Assess viability. Bleeding since last night. Quantitative beta HCG is 4,853.By LMP patient is 6 weeks 1 day. EDC by LMP is 04/14/2016. Gravida 3 para 2. EXAM: TRANSVAGINAL OB ULTRASOUND TECHNIQUE: Transvaginal ultrasound was performed for complete evaluation of the gestation as well as the maternal uterus, adnexal regions, and pelvic cul-de-sac. COMPARISON:  08/18/2015 FINDINGS: Intrauterine gestational sac: Present Yolk sac:  Present Embryo:  Not seen Cardiac Activity: Not Heart Rate: Not applicable bpm MSD: 8.0  mm   5 w   4  d Subchorionic hemorrhage:  None visualized. Maternal uterus/adnexae: Right ovary is 3.2 x 2.3 x 2.3 cm. Left ovary is 2.8 x 2.3 x 2 cm. Trace free pelvic fluid noted. Uterine fibroids  are again noted. Study quality is degraded by patient's pain level. IMPRESSION: 1. Intrauterine gestational sac. 2. No embryo yet visible. 3. No subchorionic hemorrhage identified. Electronically Signed   By: Nolon Nations M.D.   On: 08/21/2015 12:19  US Ob Transvaginal  Result Date: 08/18/2015 CLINICAL DATA:  Abdominal pain and cramping in a first trimester pregnancy. EXAM: OBSTETRIC <14 WK [place] AND TRANSVAGINAL OB US TECHNIQUE: Both transabdominal and transvaginal ultrasound examinations were performed for complete evaluation of the gestation as well as the maternal uterus, adnexal regions, and pelvic cul-de-sac. Transvaginal technique was performed to assess early pregnancy. COMPARISON:  None. FINDINGS: Intrauterine gestational sac: Present Yolk sac:  Probable tiny yolk sac is present. Embryo:  Not seen. MSD: 0.51 cm  mm   5 w   2  d Subchorionic hemorrhage:  None visualized. Maternal uterus/adnexae: 2.6 x 1.5 x 2.9 cm is seen within the anterior vaginal wall. Several subserosal and  intramural less than 1.5 cm myometrial masses are seen, likely representing fibroids. Right ovary has normal appearance. There is a 1.0 x 0.6 x 0.9 cm cystic structure adjacent to the left ovary within the left adnexa. IMPRESSION: Intrauterine gestational sac, which may represent early normal intrauterine pregnancy, corresponding to 5 weeks 2 days gestation. Probable early intrauterine gestational sac, but no definite yolk sac, fetal pole, or cardiac activity yet visualized. Recommend follow-up quantitative B-HCG levels and follow-up US in 14 days to confirm and assess viability. This recommendation follows SRU consensus guidelines: Diagnostic Criteria for Nonviable Pregnancy Early in the First Trimester. [employer] Med 2013; [id number]. 1 cm left adnexal cyst, which may represent a paraovarian cyst. In the settings of early pregnancy without visualization of intrauterine embryo however short-term follow-up is warranted to exclude  ectopic pregnancy. This structure does not have a sonographic appearance suggestive of ectopic pregnancy on today's exam. Intramural and subserosal uterine fibroids, measuring less than 1.5 cm. Electronically Signed   By: Fidela Salisbury M.D.   On: 08/18/2015 15:25  US Ob Transvaginal  Result Date: 08/16/2015 CLINICAL DATA:  Pelvic pain and cramping in first trimester pregnancy. EXAM: OBSTETRIC <14 WK [place] AND TRANSVAGINAL OB US TECHNIQUE: Both transabdominal and transvaginal ultrasound examinations were performed for complete evaluation of the gestation as well as the maternal uterus, adnexal regions, and pelvic cul-de-sac. Transvaginal technique was performed to assess early pregnancy. COMPARISON:  None. FINDINGS: Intrauterine gestational sac: Probable single tiny gestational sac visualized Yolk sac:  Not visualized Embryo:  Not visualized MSD: 3  mm   5 w   0  d Subchorionic hemorrhage:  None visualized. Maternal uterus/adnexae: Both ovaries are normal in appearance. No adnexal mass identified. Tiny amount of simple free fluid seen in cul-de-sac. IMPRESSION: Probable single early intrauterine gestational sac, but no yolk sac, fetal pole, or cardiac activity yet visualized. Recommend follow-up quantitative B-HCG levels and follow-up US in 14 days to confirm and assess viability. This recommendation follows SRU consensus guidelines: Diagnostic Criteria for Nonviable Pregnancy Early in the First Trimester. [employer] Med [id number]. Electronically Signed   By: Earle Gell M.D.   On: 08/16/2015 11:57   MAU course/MDM: Quantitative hCG, ultrasound, CBC ordered  Pain and bleeding in early pregnancy with abnormal rise in Quant, but I will IUP confirmed andhemodynamically stable.  Assessment: [redacted]w[redacted]d week live intrauterine pregnancy. Threatened miscarriage  Plan: Discharge home in stable condition. SAB precautions Pregnancy verification letter given. The circumflex providers given. Follow-up  Information    Obstetrician of your choice .   Why:  Start prenatal care       [facility] .   Why:  as needed in emergencies Contact information: [address] [id number] [place] [phone]           Medication List    TAKE these medications   albuterol 108 (90 Base) MCG/ACT inhaler Commonly known as:  PROVENTIL HFA;VENTOLIN HFA Inhale 2 puffs into the lungs every 4 (four) hours as needed for wheezing or shortness of breath.   docusate sodium 100 MG capsule Commonly known as:  COLACE Take 1 capsule (100 mg total) by mouth 2 (two) times daily as needed.   labetalol 200 MG tablet Commonly known as:  NORMODYNE Take 1 tablet (200 mg total) by mouth 2 (two) times daily.   nitrofurantoin (macrocrystal-monohydrate) 100 MG capsule Commonly known as:  MACROBID Take 1 capsule (100 mg total) by mouth  2 (two) times daily.   oxyCODONE-acetaminophen 5-325 MG tablet Commonly known as:  PERCOCET/ROXICET Take 1 tablet by mouth every 4 (four) hours as needed for severe pain.   polyethylene glycol powder powder Commonly known as:  GLYCOLAX/MIRALAX Take 17 g by mouth daily.   promethazine 25 MG tablet Commonly known as:  PHENERGAN Take 0.5-1 tablets (12.5-25 mg total) by mouth every 6 (six) hours as needed for nausea or vomiting.       Manya Silvas, CNM 08/27/2015, 11:09 PM  2/3

## 2015-09-30 ENCOUNTER — Encounter: Payer: Self-pay | Admitting: *Deleted

## 2015-10-11 ENCOUNTER — Emergency Department (HOSPITAL_COMMUNITY)
Admission: EM | Admit: 2015-10-11 | Discharge: 2015-10-11 | Disposition: A | Discharge status: Home / Self Care | Payer: BLUE CROSS/BLUE SHIELD | Attending: Dermatology | Admitting: Dermatology

## 2015-10-11 ENCOUNTER — Emergency Department (HOSPITAL_COMMUNITY): Payer: BLUE CROSS/BLUE SHIELD

## 2015-10-11 ENCOUNTER — Encounter (HOSPITAL_COMMUNITY): Payer: Self-pay | Admitting: *Deleted

## 2015-10-11 DIAGNOSIS — Z87891 Personal history of nicotine dependence: Secondary | ICD-10-CM | POA: Diagnosis not present

## 2015-10-11 DIAGNOSIS — Z5321 Procedure and treatment not carried out due to patient leaving prior to being seen by health care provider: Secondary | ICD-10-CM | POA: Diagnosis not present

## 2015-10-11 DIAGNOSIS — R079 Chest pain, unspecified: Secondary | ICD-10-CM | POA: Insufficient documentation

## 2015-10-11 DIAGNOSIS — R0981 Nasal congestion: Secondary | ICD-10-CM | POA: Insufficient documentation

## 2015-10-11 LAB — CBC
HCT: 36.2 % (ref 36.0–46.0)
Hemoglobin: 11.5 g/dL — ABNORMAL LOW (ref 12.0–15.0)
MCH: 29.1 pg (ref 26.0–34.0)
MCHC: 31.8 g/dL (ref 30.0–36.0)
MCV: 91.6 fL (ref 78.0–100.0)
PLATELETS: 300 10*3/uL (ref 150–400)
RBC: 3.95 MIL/uL (ref 3.87–5.11)
RDW: 14.2 % (ref 11.5–15.5)
WBC: 9.2 10*3/uL (ref 4.0–10.5)

## 2015-10-11 LAB — BASIC METABOLIC PANEL
Anion gap: 8 (ref 5–15)
BUN: 6 mg/dL (ref 6–20)
CALCIUM: 9.5 mg/dL (ref 8.9–10.3)
CHLORIDE: 105 mmol/L (ref 101–111)
CO2: 25 mmol/L (ref 22–32)
CREATININE: 0.99 mg/dL (ref 0.44–1.00)
GFR calc non Af Amer: >60 mL/min (ref >60)
Glucose, Bld: 92 mg/dL (ref 65–99)
Potassium: 3.9 mmol/L (ref 3.5–5.1)
SODIUM: 138 mmol/L (ref 135–145)

## 2015-10-11 LAB — I-STAT TROPONIN, ED: TROPONIN I, POC: 0 ng/mL (ref 0.00–0.08)

## 2015-10-11 NOTE — ED Notes (Signed)
Pt upset about the wait time pt LWBS

## 2015-10-11 NOTE — ED Triage Notes (Signed)
Pt c/o generalized chest pain onset 3 days ago. Pt reports congestion with worsening chest pain on R side and R back. Has also had productive cough

## 2015-10-12 ENCOUNTER — Emergency Department (HOSPITAL_COMMUNITY): Payer: BLUE CROSS/BLUE SHIELD

## 2015-10-12 ENCOUNTER — Encounter (HOSPITAL_COMMUNITY): Payer: Self-pay

## 2015-10-12 ENCOUNTER — Emergency Department (HOSPITAL_COMMUNITY)
Admission: EM | Admit: 2015-10-12 | Discharge: 2015-10-12 | Disposition: A | Discharge status: Home / Self Care | Payer: BLUE CROSS/BLUE SHIELD | Attending: Dermatology | Admitting: Dermatology

## 2015-10-12 DIAGNOSIS — Z87891 Personal history of nicotine dependence: Secondary | ICD-10-CM | POA: Insufficient documentation

## 2015-10-12 DIAGNOSIS — Z5321 Procedure and treatment not carried out due to patient leaving prior to being seen by health care provider: Secondary | ICD-10-CM | POA: Insufficient documentation

## 2015-10-12 DIAGNOSIS — R42 Dizziness and giddiness: Secondary | ICD-10-CM | POA: Diagnosis present

## 2015-10-12 NOTE — ED Notes (Signed)
Charge RN spoke to pt in waiting room regarding delays.  Verbalized understanding.

## 2015-10-12 NOTE — ED Notes (Signed)
Patient to nurses station regarding wait, explained in detail process, patient very upset regarding wait. Patient in no distress and appears within normal.

## 2015-10-12 NOTE — ED Triage Notes (Addendum)
Pt here with c/o ZRight upper back pain and chest pain when she coughs. She also describes symptoms such as headache and productive cough of green sputum. She LWBS last night.  Pt reports she had an abortion on Aug 22.

## 2015-10-12 NOTE — ED Notes (Signed)
Pt called for for room assignment. No answer no response. Checked for pt in waiting area and outside. Pt not present.

## 2015-10-12 NOTE — ED Notes (Signed)
Per radiology, pt cannot have another x-ray due to the fact that she had an x-ray yesterday but left before it was read.

## 2015-10-12 NOTE — ED Notes (Signed)
Pt called for for room assignment no answer.

## 2016-06-03 ENCOUNTER — Encounter (HOSPITAL_COMMUNITY): Payer: Self-pay | Admitting: Nurse Practitioner

## 2016-06-03 ENCOUNTER — Emergency Department (HOSPITAL_COMMUNITY)
Admission: EM | Admit: 2016-06-03 | Discharge: 2016-06-03 | Disposition: A | Discharge status: Home / Self Care | Payer: Medicaid Other | Attending: Emergency Medicine | Admitting: Emergency Medicine

## 2016-06-03 ENCOUNTER — Emergency Department (HOSPITAL_COMMUNITY): Payer: Medicaid Other

## 2016-06-03 DIAGNOSIS — J069 Acute upper respiratory infection, unspecified: Secondary | ICD-10-CM | POA: Diagnosis not present

## 2016-06-03 DIAGNOSIS — F17210 Nicotine dependence, cigarettes, uncomplicated: Secondary | ICD-10-CM | POA: Diagnosis not present

## 2016-06-03 DIAGNOSIS — B9789 Other viral agents as the cause of diseases classified elsewhere: Secondary | ICD-10-CM

## 2016-06-03 DIAGNOSIS — R05 Cough: Secondary | ICD-10-CM

## 2016-06-03 DIAGNOSIS — R059 Cough, unspecified: Secondary | ICD-10-CM

## 2016-06-03 MED ORDER — BENZONATATE 100 MG PO CAPS: 100.0000 mg | ORAL_CAPSULE | Freq: Three times a day (TID) | ORAL | 0 refills | Status: DC

## 2016-06-03 NOTE — ED Triage Notes (Addendum)
Pt presents with c./o URI.  Her symptoms began this past Friday. She reports sweats, chills, headaches, malaise, rhinorrhea. She denies sore throat, nausea, vomiting, diarrhea. She has been taking theraflu with some symptom improvement until today when she began to feel much worse.

## 2016-06-03 NOTE — Discharge Instructions (Signed)
It was my pleasure taking care of you today!   Your symptoms are likely due to a viral upper respiratory infection. Fortunately, we did not see evidence of serious infection and can treat your symptoms. Flonase for nasal congestion, tessalon as needed for cough. You actually can continue taking Theraflu along with cough medication I have prescribed today if needed.   Rest, drink plenty of fluids to be sure you are staying hydrated.   Please follow up with your primary doctor for discussion of your diagnoses and further evaluation after today's visit if symptoms persist longer than 7 days; Return to the ER for high fevers, difficulty breathing or other concerning symptoms

## 2016-06-03 NOTE — ED Notes (Signed)
Patient transported to X-ray 

## 2016-06-03 NOTE — ED Provider Notes (Signed)
[facility] DEPT Provider Note   CSN: [id number] Arrival date & time: 06/03/16  1643  By signing my name below, I, Hansel Feinstein and Alexia Perkins-Maupin, attest that this documentation has been prepared under the direction and in the presence of  [facility], PA-C. Electronically Signed: Catarina Hartshorn, ED Scribe. 06/03/16. 7:37 PM.   History   Chief Complaint Chief Complaint  Patient presents with  . URI    HPI Rebecca Spence is a 36 y.o. female presents to the Emergency Department complaining of generalized body aches with associated congestion, productive cough, watery eyes, sore throat that began 3 days ago and suddenly worsened this morning. She reports sick contact with her son who currently has a cold. She states she has tried OTC cold/flu medication with no relief. Pt reports she has been consistently taking Claritin with the change in season d/t her h/o seasonal allergies. She denies diarrhea and abdominal pain.  The history is provided by the patient. No language interpreter was used.    Past Medical History:  Diagnosis Date  . Bilateral ovarian cysts   . Gestational diabetes   . Headache(784.0)   . Pregnancy induced hypertension     Patient Active Problem List   Diagnosis Date Noted  . Chronic hypertension complicating or reason for care during pregnancy 08/16/2015  . Wheezing 09/24/2013  . Influenza-like illness 09/24/2013    Past Surgical History:  Procedure Laterality Date  . NO PAST SURGERIES      OB History    Gravida Para Term Preterm AB Living   3 2 2  0   2   SAB TAB Ectopic Multiple Live Births           2       Home Medications    Prior to Admission medications   Medication Sig Start Date End Date Taking? Authorizing Provider  albuterol (PROVENTIL HFA;VENTOLIN HFA) 108 (90 BASE) MCG/ACT inhaler Inhale 2 puffs into the lungs every 4 (four) hours as needed for wheezing or shortness of breath. 12/23/12   Harris,  Abigail, PA-C  benzonatate (TESSALON) 100 MG capsule Take 1 capsule (100 mg total) by mouth every 8 (eight) hours. 06/03/16   Shawonda Kerce, Ozella Almond, PA-C  docusate sodium (COLACE) 100 MG capsule Take 1 capsule (100 mg total) by mouth 2 (two) times daily as needed. 08/21/15   Leftwich-Kirby, Kathie Dike, CNM  labetalol (NORMODYNE) 200 MG tablet Take 1 tablet (200 mg total) by mouth 2 (two) times daily. 08/21/15   Leftwich-Kirby, Kathie Dike, CNM  nitrofurantoin, macrocrystal-monohydrate, (MACROBID) 100 MG capsule Take 1 capsule (100 mg total) by mouth 2 (two) times daily. 08/27/15   Tamala Julian, [place], CNM  oxyCODONE-acetaminophen (PERCOCET/ROXICET) 5-325 MG tablet Take 1 tablet by mouth every 4 (four) hours as needed for severe pain. 08/21/15   Leftwich-Kirby, Kathie Dike, CNM  polyethylene glycol powder (GLYCOLAX/MIRALAX) powder Take 17 g by mouth daily. 08/21/15   Leftwich-Kirby, Kathie Dike, CNM  promethazine (PHENERGAN) 25 MG tablet Take 0.5-1 tablets (12.5-25 mg total) by mouth every 6 (six) hours as needed for nausea or vomiting. 08/21/15   Leftwich-Kirby, Kathie Dike, CNM    Family History Family History  Problem Relation Age of Onset  . Diabetes Mother   . Hypertension Mother   . Diabetes Maternal Grandmother   . Hyperlipidemia Maternal Grandmother   . Anesthesia problems Neg Hx   . Hypotension Neg Hx   . Malignant hyperthermia Neg Hx   . Pseudochol deficiency Neg Hx  Social History Social History  Substance Use Topics  . Smoking status: Current Every Day Smoker    Types: Cigarettes  . Smokeless tobacco: Never Used  . Alcohol use Yes     Comment: socially-not with pregnancy     Allergies   [place]   Review of Systems Review of Systems  HENT: Positive for congestion and sore throat.   Eyes: Positive for discharge.  Respiratory: Positive for cough.   Gastrointestinal: Negative for abdominal pain and diarrhea.  Musculoskeletal: Positive for myalgias.  Allergic/Immunologic: Positive for environmental  allergies.     Physical Exam Updated Vital Signs BP (!) 142/102 (BP Location: Left Arm)   Pulse 90   Temp 99.2 F (37.3 C) (Oral)   Resp 16   LMP 07/09/2015   SpO2 100%   Physical Exam  Constitutional: She appears well-developed and well-nourished. No distress.  HENT:  Head: Normocephalic and atraumatic.  OP with erythema but no exudates or tonsillar hypertrophy. No focal areas of sinus tenderness. +nasal discharge  Neck: Neck supple.  Cardiovascular: Normal rate, regular rhythm and normal heart sounds.   No murmur heard. Pulmonary/Chest: Effort normal and breath sounds normal. No respiratory distress. She has no wheezes. She has no rales.  Lungs CTA bilaterally.   Musculoskeletal: Normal range of motion.  Neurological: She is alert.  Skin: Skin is warm and dry.  Nursing note and vitals reviewed.    ED Treatments / Results  DIAGNOSTIC STUDIES: Oxygen Saturation is 100% on RA, normal by my interpretation.   COORDINATION OF CARE: 7:07 PM-Discussed next steps with pt. Pt verbalized understanding and is agreeable with the plan. Xray will be ordered   Procedures Procedures (including critical care time)  Medications Ordered in ED Medications - No data to display   Initial Impression / Assessment and Plan / ED Course  I have reviewed the triage vital signs and the nursing notes.     Rebecca Spence is a 36 y.o. female who presents to ED for cough, congestion x 3 days. On exam, patient is afebrile, non-toxic appearing with a clear lung exam. Mild rhinorrhea and OP with erythema but no exudates or tonsillar hypertrophy. CXR negative.   Sxs today likely due to viral URI. Symptomatic home care instructions discussed. Rx for Tessalon given. PCP follow up strongly encouraged if symptoms persist. Reasons to return to ER discussed. All questions answered.   Final Clinical Impressions(s) / ED Diagnoses   Final diagnoses:  Cough  Viral URI with cough    New  Prescriptions New Prescriptions   BENZONATATE (TESSALON) 100 MG CAPSULE    Take 1 capsule (100 mg total) by mouth every 8 (eight) hours.    I personally performed the services described in this documentation, which was scribed in my presence. The recorded information has been reviewed and is accurate.     Flower Franko, Ozella Almond, PA-C 06/03/16 Leeanne Deed, MD 06/03/16 2135

## 2016-08-30 ENCOUNTER — Encounter: Payer: Self-pay | Admitting: Internal Medicine

## 2016-08-30 ENCOUNTER — Ambulatory Visit (INDEPENDENT_AMBULATORY_CARE_PROVIDER_SITE_OTHER): Payer: Medicaid Other | Admitting: Internal Medicine

## 2016-08-30 VITALS — BP 142/100 | HR 68 | Resp 12 | Ht 66.0 in | Wt 236.0 lb

## 2016-08-30 DIAGNOSIS — R609 Edema, unspecified: Secondary | ICD-10-CM | POA: Diagnosis not present

## 2016-08-30 DIAGNOSIS — I10 Essential (primary) hypertension: Secondary | ICD-10-CM | POA: Diagnosis not present

## 2016-08-30 DIAGNOSIS — F439 Reaction to severe stress, unspecified: Secondary | ICD-10-CM | POA: Diagnosis not present

## 2016-08-30 MED ORDER — HYDROCHLOROTHIAZIDE 12.5 MG PO TABS: 12.5000 mg | ORAL_TABLET | Freq: Every day | ORAL | 11 refills | Status: DC

## 2016-08-30 NOTE — Patient Instructions (Signed)
Drink a glass of water before every meal Drink 6-8 glasses of water daily Eat three meals daily Eat a protein and healthy fat with every meal (eggs,fish, chicken, [place] and limit red meats) Eat 5 servings of vegetables daily, mix the colors Eat 2 servings of fruit daily with skin, if skin is edible Use smaller plates Put food/utensils down as you chew and swallow each bite Eat at a table with friends/family at least once daily, no TV Do not eat in front of the TV  Start with weaning your pepsi intake!

## 2016-08-30 NOTE — Progress Notes (Signed)
Subjective:    Patient ID: Rebecca Spence, female    DOB: [birth year], 36 y.o.   MRN: [id number]  HPI   Here to establish.  Interested in speaking with Macie Burows, LCSW.  1.  Essential Hypertension:  BP up and down in years past.  Was on Labetolol for bp while pregnant in 2013, which was stopped after delivery, but BP went up and stayed up thereafter.  Went to get Depo Provera 3 months ago and her bp was high--told she needs to get on medication for bp before they would consider her next shot due on the 16th. Was on Labetolol for bp while pregnant in 2013.  After delivered, this was stopped.   Hypertension runs in family. She is a frequent urinator, so not interested in diuretic.   Does, however, have swelling of ankles when on feet a lot.  No varicose veins.   Does drink 2-3 sodas daily--Pepsi, some salty snacks, Does add salt when cooking.  Has a very hectic schedule with work at [employer] in [employer], then the kids' activities and school for Psychologist, sport and exercise at night.  Has to pick up kids around 11 pm and get them home and back to bed.  Starts again at 6:15 a.m. The next day.  Little sleep.   No outpatient prescriptions have been marked as taking for the 08/30/16 encounter (Office Visit) with Mack Hook, MD.    Allergies  Allergen Reactions  . Orange Anaphylaxis and Hives    "My throat starts closing up, my tongue, lips started swelling & burning.  Similar reaction to onions.    Past Medical History:  Diagnosis Date  . Bilateral ovarian cysts   . Gestational diabetes   . Headache(784.0)   . Pregnancy induced hypertension     Past Surgical History:  Procedure Laterality Date  . NO PAST SURGERIES      Family History  Problem Relation Age of Onset  . Diabetes Mother   . Hypertension Mother   . Diabetes Maternal Grandmother   . Hyperlipidemia Maternal Grandmother   . Cancer Sister        cervical  . Asthma Son   . Allergies Son   . Eczema  Son   . Asthma Son   . Allergies Son   . Eczema Son     Social History   Social History  . Marital status: Single    Spouse name: N/A  . Number of children: 2  . Years of education: associates degree working on.  Has GED   Occupational History  . 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 Smoking status: Current Every Day Smoker    Packs/day: 0.05    Years: 13.00    Types: Cigarettes  . Smokeless tobacco: Never Used     Comment: no time to consider--bums off others.  . Alcohol use Yes     Comment: socially-not with pregnancy  . Drug use: No     Comment: No use of drugs currently--2018  . Sexual activity: Yes    Birth control/ protection: Injection, Condom   Other Topics Concern  . Not on file   Social History Narrative   Born and raised in [place].  Lives close to [employer] with 2 sons.  Father involved.   Mother takes care of children as well.        Review of Systems     Objective:   Physical Exam NAD Obese HEENT:  PERRL, EOMI, TMs pearly gray, throat without injection. Neck:  Supple, No adenopathy, no thyromegaly Chest:  CTA CV: RRR with normal S1 and S2, No S3, S4 or murmur, radial and DP pulses normal and equal Abd:  S NT, No HSM or mass, + BS, no abdominal bruits LE:  No current edema       Assessment & Plan:  1. Essential Hypertension:  Describes frequent urination, but has a very large caffeinated fluid intake.   Encouraged to cut back on sodas first for weight loss as well. With history of LE edema, will start on HCTZ 12.5 mg daily. Follow up in 2 weeks for bp and pulse check as well as fasting labs:  CBC, CMP, FLP With me in 2 months for CPE no pap.  2.  Birth control:  To get her next upcoming Depo Provera from [facility].  In future, we can order and have her bring to nurse visit appt. For administration every 3 months.  3.  Obesity:  Discussed lifestyle changes for diet, physical activity and sleep to improve  weight loss and control bp/avoid DM as well.  See #4.  4.  Stress:  Incredibly hectic schedule, though with significant family support.  Referral to Macie Burows, LCSW when she returns next week.

## 2016-09-06 ENCOUNTER — Telehealth: Payer: Self-pay | Admitting: Licensed Clinical Social Worker

## 2016-09-06 NOTE — Telephone Encounter (Signed)
LCSW called pt to follow up after counseling referral from Dr Amil Amen. Was not able to leave voicemail.

## 2016-09-13 ENCOUNTER — Other Ambulatory Visit (INDEPENDENT_AMBULATORY_CARE_PROVIDER_SITE_OTHER): Payer: Medicaid Other

## 2016-09-13 VITALS — BP 126/82 | HR 80

## 2016-09-13 DIAGNOSIS — Z13220 Encounter for screening for lipoid disorders: Secondary | ICD-10-CM

## 2016-09-13 DIAGNOSIS — Z79899 Other long term (current) drug therapy: Secondary | ICD-10-CM

## 2016-09-14 LAB — LIPID PANEL W/O CHOL/HDL RATIO
CHOLESTEROL TOTAL: 194 mg/dL (ref 100–199)
HDL: 32 mg/dL — AB (ref >39)
LDL CALC: 114 mg/dL — AB (ref 0–99)
TRIGLYCERIDES: 242 mg/dL — AB (ref 0–149)
VLDL CHOLESTEROL CAL: 48 mg/dL — AB (ref 5–40)

## 2016-09-14 LAB — CBC WITH DIFFERENTIAL/PLATELET
BASOS ABS: 0.1 10*3/uL (ref 0.0–0.2)
Basos: 1 %
EOS (ABSOLUTE): 0.2 10*3/uL (ref 0.0–0.4)
Eos: 2 %
HEMOGLOBIN: 12.6 g/dL (ref 11.1–15.9)
Hematocrit: 39.6 % (ref 34.0–46.6)
IMMATURE GRANS (ABS): 0 10*3/uL (ref 0.0–0.1)
IMMATURE GRANULOCYTES: 0 %
LYMPHS: 45 %
Lymphocytes Absolute: 4.4 10*3/uL — ABNORMAL HIGH (ref 0.7–3.1)
MCH: 29 pg (ref 26.6–33.0)
MCHC: 31.8 g/dL (ref 31.5–35.7)
MCV: 91 fL (ref 79–97)
MONOS ABS: 0.7 10*3/uL (ref 0.1–0.9)
Monocytes: 7 %
NEUTROS ABS: 4.4 10*3/uL (ref 1.4–7.0)
Neutrophils: 45 %
PLATELETS: 311 10*3/uL (ref 150–379)
RBC: 4.35 x10E6/uL (ref 3.77–5.28)
RDW: 15.3 % (ref 12.3–15.4)
WBC: 9.7 10*3/uL (ref 3.4–10.8)

## 2016-09-14 LAB — COMPREHENSIVE METABOLIC PANEL
ALT: 12 IU/L (ref 0–32)
AST: 16 IU/L (ref 0–40)
Albumin/Globulin Ratio: 1.5 (ref 1.2–2.2)
Albumin: 4.3 g/dL (ref 3.5–5.5)
Alkaline Phosphatase: 96 IU/L (ref 39–117)
BUN/Creatinine Ratio: 9 (ref 9–23)
BUN: 9 mg/dL (ref 6–20)
Bilirubin Total: 0.4 mg/dL (ref 0.0–1.2)
CO2: 23 mmol/L (ref 20–29)
Calcium: 9.4 mg/dL (ref 8.7–10.2)
Chloride: 101 mmol/L (ref 96–106)
Creatinine, Ser: 0.96 mg/dL (ref 0.57–1.00)
GFR calc Af Amer: 87 mL/min/{1.73_m2} (ref >59)
GFR calc non Af Amer: 76 mL/min/{1.73_m2} (ref >59)
Globulin, Total: 2.8 g/dL (ref 1.5–4.5)
Glucose: 119 mg/dL — ABNORMAL HIGH (ref 65–99)
Potassium: 4.5 mmol/L (ref 3.5–5.2)
Sodium: 142 mmol/L (ref 134–144)
Total Protein: 7.1 g/dL (ref 6.0–8.5)

## 2016-10-01 ENCOUNTER — Encounter: Payer: Self-pay | Admitting: Internal Medicine

## 2016-10-01 DIAGNOSIS — E785 Hyperlipidemia, unspecified: Secondary | ICD-10-CM

## 2016-10-01 DIAGNOSIS — R7303 Prediabetes: Secondary | ICD-10-CM

## 2016-10-01 DIAGNOSIS — E119 Type 2 diabetes mellitus without complications: Secondary | ICD-10-CM

## 2016-10-01 HISTORY — DX: Type 2 diabetes mellitus without complications: E11.9

## 2016-10-01 HISTORY — DX: Prediabetes: R73.03

## 2016-10-01 HISTORY — DX: Hyperlipidemia, unspecified: E78.5

## 2016-10-08 ENCOUNTER — Other Ambulatory Visit (INDEPENDENT_AMBULATORY_CARE_PROVIDER_SITE_OTHER): Payer: Medicaid Other

## 2016-10-08 DIAGNOSIS — R739 Hyperglycemia, unspecified: Secondary | ICD-10-CM | POA: Diagnosis not present

## 2016-10-09 LAB — HGB A1C W/O EAG: HEMOGLOBIN A1C: 6.3 % — AB (ref 4.8–5.6)

## 2016-10-29 ENCOUNTER — Telehealth: Payer: Self-pay | Admitting: Internal Medicine

## 2016-10-29 ENCOUNTER — Ambulatory Visit (INDEPENDENT_AMBULATORY_CARE_PROVIDER_SITE_OTHER): Payer: Medicaid Other | Admitting: Internal Medicine

## 2016-10-29 ENCOUNTER — Encounter: Payer: Self-pay | Admitting: Internal Medicine

## 2016-10-29 VITALS — BP 130/80 | HR 82 | Resp 12 | Ht 66.0 in | Wt 236.0 lb

## 2016-10-29 DIAGNOSIS — E010 Iodine-deficiency related diffuse (endemic) goiter: Secondary | ICD-10-CM | POA: Diagnosis not present

## 2016-10-29 DIAGNOSIS — H547 Unspecified visual loss: Secondary | ICD-10-CM

## 2016-10-29 DIAGNOSIS — L309 Dermatitis, unspecified: Secondary | ICD-10-CM | POA: Diagnosis not present

## 2016-10-29 DIAGNOSIS — Z0000 Encounter for general adult medical examination without abnormal findings: Secondary | ICD-10-CM

## 2016-10-29 DIAGNOSIS — L853 Xerosis cutis: Secondary | ICD-10-CM | POA: Diagnosis not present

## 2016-10-29 HISTORY — DX: Dermatitis, unspecified: L30.9

## 2016-10-29 MED ORDER — TRIAMCINOLONE ACETONIDE 0.1 % EX CREA: TOPICAL_CREAM | CUTANEOUS | 0 refills | Status: DC

## 2016-10-29 NOTE — Telephone Encounter (Signed)
To Dr. Mulberry  

## 2016-10-29 NOTE — Patient Instructions (Addendum)
Drink a glass of water before every meal Drink 6-8 glasses of water daily Eat three meals daily Eat a protein and healthy fat with every meal (eggs,fish, chicken, [place] and limit red meats) Eat 5 servings of vegetables daily, mix the colors Eat 2 servings of fruit daily with skin, if skin is edible Use smaller plates Put food/utensils down as you chew and swallow each bite Eat at a table with friends/family at least once daily, no TV Do not eat in front of the TV  For feet:  Mix [place] Foot Cream with Tea Tree Oil and rub into feet and toenail meds after bathing twice daily Eucerin Cream for Eczema relief all over after bathing.

## 2016-10-29 NOTE — Progress Notes (Signed)
Subjective:    Patient ID: Rebecca Spence, female    DOB: 1980/04/22, 36 y.o.   MRN: [id number]  HPI   CPE with pap  1.  Pap: She believes her last pap was 08/2015.  Reportedly normal at the [facility].  No history of abnormal pap.  Sister may have history of cervical cancer, unknown age  43.  Mammogram:  Never.  No first degree relative with breast cancer.  Cousins and aunts have been diagnosed.  (distant--second cousins and great aunt)  3.  Osteoprevention:  Drinks 1 cup milk daily.  Has diarrhea with dairy.  No interest in yogurt.    4.  Guaiac Cards:  Never.  No family history of colon cancer.  5.  Colonoscopy:  Never.  See above.  6.  Immunizations:  Has not had influenza vaccine this fall. Immunization History  Administered Date(s) Administered  . Tdap 10/23/2011    7.  Glucose/Cholesterol:  Recently found to be prediabetic with A1C of 6.3%.  Discussed diet and physical activity. Has dyslipidemia as well. Lipid Panel     Component Value Date/Time   CHOL 194 09/13/2016 0844   TRIG 242 (H) 09/13/2016 0844   HDL 32 (L) 09/13/2016 0844   LDLCALC 114 (H) 09/13/2016 0844   Current Meds  Medication Sig  . hydrochlorothiazide (HYDRODIURIL) 12.5 MG tablet Take 1 tablet (12.5 mg total) by mouth daily.    Allergies  Allergen Reactions  . Orange Anaphylaxis and Hives    "My throat starts closing up, my tongue, lips started swelling & burning.  Similar reaction to onions.    Past Medical History:  Diagnosis Date  . Bilateral ovarian cysts   . Essential hypertension 2016  . Gestational diabetes    09/2016:  prediabetes  . Headache(784.0)   . Hyperglycemia 10/01/2016  . Hyperlipidemia 10/01/2016    Past Surgical History:  Procedure Laterality Date  . NO PAST SURGERIES     Family History  Problem Relation Age of Onset  . Diabetes Mother   . Hypertension Mother   . Diabetes Maternal Grandmother   . Hyperlipidemia Maternal Grandmother   . Cancer Sister    cervical  . Asthma Son   . Allergies Son   . Eczema Son   . Asthma Son   . Allergies Son   . Eczema Son    Social History   Social History  . Marital status: Single    Spouse name: N/A  . Number of children: 2  . Years of education: associates degree working on.  Has GED   Occupational History  . 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 Smoking status: Current Every Day Smoker    Packs/day: 0.05    Years: 13.00    Types: Cigarettes  . Smokeless tobacco: Never Used     Comment: no time to consider--bums off others.  . Alcohol use Yes     Comment: socially-not with pregnancy  . Drug use: No     Comment: No use of drugs currently--2018  . Sexual activity: Yes    Birth control/ protection: Injection, Condom   Other Topics Concern  . Not on file   Social History Narrative   Born and raised in [place].  Lives close to [employer] with 2 sons.  Father involved.   Mother takes care of children as well.     Review of Systems  Constitutional: Negative for fatigue, fever and unexpected weight change.  HENT:  Negative for dental problem, ear pain, hearing loss and sore throat.   Eyes: Positive for visual disturbance (thinks may need glasses). Negative for itching.  Respiratory: Negative for cough and shortness of breath.   Cardiovascular: Negative for chest pain, palpitations and leg swelling.  Gastrointestinal: Negative for abdominal pain, blood in stool (no melena), constipation and diarrhea.  Endocrine: Positive for polyuria (Since starting diuretic). Negative for polydipsia.  Genitourinary: Negative for dyspareunia, dysuria, pelvic pain and vaginal discharge.  Musculoskeletal: Negative for arthralgias.  Skin: Positive for rash (little bumps on backs of hands, face.).  Neurological: Negative for weakness and numbness.  Hematological: Negative for adenopathy. Does not bruise/bleed easily.  Psychiatric/Behavioral: Negative for dysphoric mood.  The patient is not nervous/anxious.        Objective:   Physical Exam  Constitutional: She is oriented to person, place, and time. She appears well-developed and well-nourished.  Obese  HENT:  Head: Normocephalic and atraumatic.  Right Ear: Hearing, tympanic membrane, external ear and ear canal normal.  Left Ear: Hearing, tympanic membrane, external ear and ear canal normal.  Nose: Nose normal.  Mouth/Throat: Uvula is midline, oropharynx is clear and moist and mucous membranes are normal.  Eyes: Pupils are equal, round, and reactive to light. Conjunctivae and EOM are normal.  Discs sharp bilaterally  Neck: Normal range of motion and full passive range of motion without pain. Neck supple. Thyromegaly present.  Cardiovascular: Normal rate, regular rhythm, S1 normal and S2 normal.  Exam reveals no S3, no S4 and no friction rub.   No murmur heard. No carotid bruits.  Carotid, radial, femoral, DP and PT pulses normal and equal.   Pulmonary/Chest: Effort normal and breath sounds normal.  Breast exam deferred as previous examination at [facility] this year.  Abdominal: Soft. Bowel sounds are normal. She exhibits no mass. There is no hepatosplenomegaly. There is no tenderness. No hernia.  Genitourinary:  Genitourinary Comments: Deferred as previous exam earlier this year per patient at [facility]  Musculoskeletal: Normal range of motion.  Mildly tender over lateral right ankle without swelling or erythema.  Full ROM  Lymphadenopathy:       Head (right side): No submental and no submandibular adenopathy present.       Head (left side): No submental and no submandibular adenopathy present.    She has no cervical adenopathy.    She has no axillary adenopathy.       Right: No inguinal and no supraclavicular adenopathy present.       Left: No inguinal and no supraclavicular adenopathy present.  Neurological: She is alert and oriented to person, place, and time. She has normal strength and normal reflexes. She  displays normal reflexes. No cranial nerve deficit or sensory deficit. Coordination and gait normal.  Skin: Skin is warm. Rash (translucent 3 mm bumps on tip of nose.  Dry, lichenified skin on dorsal forearms mixed with tiny bumps to dorsal hands bilaterally.) noted.  Psychiatric: She has a normal mood and affect. Her speech is normal and behavior is normal. Judgment and thought content normal. Cognition and memory are normal.    Bumps on nose tip and forearms--spring and fall especially      Assessment & Plan:  1.  CPE Already had pap, pelvic and breast exam at [facility]. Will call when influenza available  2.  Birth Control:  Sending for records from [facility] and also patient to call in when her next Depo provera is due (November) and will send in Rx for Depo.  3.  Eczema?  Triamcinolone cream 0.1% twice daily as needed to areas of rash--minimal to tip of nose.  Eucerin cream all over twice daily  4.  Prediabetes/hyperlipidemia:  To work on lifestyle for next 3 months and recheck A1C and FLP. Discuss medication if no improvement.  Make small goals.   5.  Thyromegaly:  TSH  6.  Decreased visual acuity:  Eye referral.

## 2016-10-29 NOTE — Telephone Encounter (Signed)
Patient called to inform us that Depro shot - next shot due November 15-22nd.  [facility] Department on [address].

## 2016-10-30 LAB — TSH: TSH: 1.42 u[IU]/mL (ref 0.450–4.500)

## 2016-11-05 NOTE — Telephone Encounter (Signed)
Have we sent for the records regarding her visits there and to confirm the date?

## 2016-11-06 NOTE — Telephone Encounter (Signed)
To Mrs. Pat for further direction

## 2016-11-12 NOTE — Progress Notes (Signed)
Referral, OV notes and demographics faxed to Dr. Patrici Ranks office. Patient scheduled for appointment on 12/04/16 @ 9 am. Message left for patient with this information.

## 2016-11-13 NOTE — Telephone Encounter (Signed)
Pat re-faxed Authorization to Release Medical Information to [facility]  Department.

## 2016-11-13 NOTE — Telephone Encounter (Signed)
To Dr. Mulberry  

## 2016-11-13 NOTE — Telephone Encounter (Addendum)
Pat faxed release of information to [facility] Department on August 30, 2016.  Records have not been received.  Will follow up and re-fax.

## 2016-11-25 NOTE — Telephone Encounter (Signed)
Have the records arrived yet?  Please notify me verbally

## 2016-11-26 ENCOUNTER — Telehealth: Payer: Self-pay | Admitting: Internal Medicine

## 2016-11-26 MED ORDER — MEDROXYPROGESTERONE ACETATE 150 MG/ML IM SUSP: 150.0000 mg | Freq: Once | INTRAMUSCULAR | 1 refills | Status: DC

## 2016-11-26 NOTE — Telephone Encounter (Signed)
Spoke with patient scheduled for depo shot on 12/04/16 @ 11:30 am

## 2016-11-26 NOTE — Telephone Encounter (Signed)
January appointment cancelled and will schedule February appointment when patient comes in for shot next week. Information is listed in appointment notes for front desk to schedule

## 2016-11-26 NOTE — Telephone Encounter (Signed)
Records still have not come over yet. refaxed request for the third time

## 2016-11-26 NOTE — Telephone Encounter (Signed)
Will need follow up in February between Feb 6th to the 13th for pap/breast exam.  Can reschedule her January follow up for then as well. She has a refill for her Depo provera to bring for injection at that visit.

## 2016-11-26 NOTE — Telephone Encounter (Signed)
Received her records.  Last documented Depo provera 150 mg IM was given on 09/05/2016, so she needs to bring Depo Provera in for injection sometime next week on or before Thursday, the 15th, which is exactly 13 weeks.  Will then need to be scheduled for every 13 weeks  Depo.

## 2016-12-04 ENCOUNTER — Ambulatory Visit (INDEPENDENT_AMBULATORY_CARE_PROVIDER_SITE_OTHER): Payer: Medicaid Other

## 2016-12-04 DIAGNOSIS — Z309 Encounter for contraceptive management, unspecified: Secondary | ICD-10-CM

## 2016-12-04 LAB — POCT URINE PREGNANCY: Preg Test, Ur: NEGATIVE

## 2016-12-04 MED ORDER — MEDROXYPROGESTERONE ACETATE 150 MG/ML IM SUSP
150.0000 mg | Freq: Once | INTRAMUSCULAR | Status: AC
Start: 2016-12-04 — End: 2016-12-04
  Administered 2016-12-04: 150 mg via INTRAMUSCULAR

## 2016-12-23 ENCOUNTER — Telehealth: Payer: Self-pay | Admitting: Internal Medicine

## 2016-12-23 NOTE — Telephone Encounter (Signed)
Patient called with concerns regarding re-call of Rx.  hydrochlorothiazide (HYDRODIURIL) 12.5 mg tablet.  Patient would like to be called with advise. [phone]  Patient is concerned if she should take Rx in the morning.

## 2016-12-24 NOTE — Telephone Encounter (Signed)
Spoke with patient. Informed it was not this medication specifically that was on recall. It was Losartan/HCTZ that was on the recall list. Patient verbalized understanding.

## 2017-01-24 ENCOUNTER — Encounter: Payer: Self-pay | Admitting: Internal Medicine

## 2017-01-27 ENCOUNTER — Ambulatory Visit: Payer: Medicaid Other | Admitting: Internal Medicine

## 2017-02-24 ENCOUNTER — Other Ambulatory Visit (INDEPENDENT_AMBULATORY_CARE_PROVIDER_SITE_OTHER): Payer: Medicaid Other

## 2017-02-24 DIAGNOSIS — R7303 Prediabetes: Secondary | ICD-10-CM

## 2017-02-24 DIAGNOSIS — E785 Hyperlipidemia, unspecified: Secondary | ICD-10-CM

## 2017-02-25 LAB — LIPID PANEL W/O CHOL/HDL RATIO
Cholesterol, Total: 189 mg/dL (ref 100–199)
HDL: 35 mg/dL — ABNORMAL LOW (ref >39)
LDL CALC: 122 mg/dL — AB (ref 0–99)
Triglycerides: 159 mg/dL — ABNORMAL HIGH (ref 0–149)
VLDL CHOLESTEROL CAL: 32 mg/dL (ref 5–40)

## 2017-02-25 LAB — HGB A1C W/O EAG: Hgb A1c MFr Bld: 6.6 % — ABNORMAL HIGH (ref 4.8–5.6)

## 2017-02-26 ENCOUNTER — Ambulatory Visit: Payer: Medicaid Other | Admitting: Internal Medicine

## 2017-02-26 ENCOUNTER — Encounter: Payer: Self-pay | Admitting: Internal Medicine

## 2017-02-26 VITALS — BP 128/80 | HR 86 | Resp 12 | Ht 66.0 in | Wt 230.0 lb

## 2017-02-26 DIAGNOSIS — E785 Hyperlipidemia, unspecified: Secondary | ICD-10-CM

## 2017-02-26 DIAGNOSIS — Z309 Encounter for contraceptive management, unspecified: Secondary | ICD-10-CM | POA: Diagnosis not present

## 2017-02-26 DIAGNOSIS — E119 Type 2 diabetes mellitus without complications: Secondary | ICD-10-CM

## 2017-02-26 DIAGNOSIS — N898 Other specified noninflammatory disorders of vagina: Secondary | ICD-10-CM | POA: Diagnosis not present

## 2017-02-26 DIAGNOSIS — Z124 Encounter for screening for malignant neoplasm of cervix: Secondary | ICD-10-CM

## 2017-02-26 LAB — POCT WET PREP WITH KOH
KOH PREP POC: NEGATIVE
RBC Wet Prep HPF POC: NEGATIVE
TRICHOMONAS UA: NEGATIVE
Yeast Wet Prep HPF POC: NEGATIVE

## 2017-02-26 MED ORDER — MEDROXYPROGESTERONE ACETATE 150 MG/ML IM SUSP
150.0000 mg | Freq: Once | INTRAMUSCULAR | Status: AC
  Administered 2017-02-26: 150 mg via INTRAMUSCULAR

## 2017-02-26 MED ORDER — METFORMIN HCL 500 MG PO TABS: 500.0000 mg | ORAL_TABLET | Freq: Two times a day (BID) | ORAL | 11 refills | Status: DC

## 2017-02-26 NOTE — Progress Notes (Signed)
   Subjective:    Patient ID: Rebecca Spence, female    DOB: Aug 05, 1980, 37 y.o.   MRN: [id number]  HPI  1.  New onset DM:  A1C recently up to 6.6%, despite 6 lb weight loss Here to follow up on CPE with breast and pelvic/CPAP exam.   Has cut sodas to one daily.  Was doing 4-5 daily. Has cut back on bad foods Eating lots of nuts and only eating one meal daily.  Had 2 garlic knots and large salad last night and 5 salad dressing cup containers of ranch dressing.  Discussed the dressing is not healthy, nor the garlic knots Breads switched to whole grains.  2.  Dyslipidemia:  Discussed with DM, she is not at goal with LDL of 122, trigs of 159 and HDL of 35.  Is trying to make lifestyle changes as above.  Current Meds  Medication Sig  . hydrochlorothiazide (HYDRODIURIL) 12.5 MG tablet Take 1 tablet (12.5 mg total) by mouth daily.  Marland Kitchen triamcinolone cream (KENALOG) 0.1 % Apply small amount twice daily to affected areas (Patient not taking: Reported on 03/05/2017)   Allergies  Allergen Reactions  . Orange Anaphylaxis and Hives    "My throat starts closing up, my tongue, lips started swelling & burning.  Similar reaction to onions.    Review of Systems     Objective:   Physical Exam  NAD Breasts:  Pendulous, but without focal mass, skin dimpling, nipple discharge or axillary adenopathy.   GU:  Normal external genitalia, No cervical lesions.  No uterine or adnexal mass or tenderness. Frothy white yellow vaginal discharge, no odor      Assessment & Plan:  1.  DM:  Start Metformin 500 mg twice daily.  Long discussion on dietary and physical activity changes for weight loss and improved control Fasting labs in 3 months with follow up 2 days later.  2. Tobacco Abuse:  Encouraged to get started with Nicotine Patches for cessation.  Extended discussion on how to get started and how to taper.  She was unable to promise to quit with start of patch, but agreed to 3 cigs daily at time of  return.   3.  Completion of CPE with breast and pelvic/pap exam.  Wet prep, GC/chlamydia for vaginal discharge.  Did have wet prep supporting BV, but asymptomatic, so will not treat.   4.  Birth control:  Return for DepoProvera 150 mg IM today after pick up from pharmacy.

## 2017-02-26 NOTE — Patient Instructions (Addendum)
Tobacco Cessation:   1800QUITNOW or [phone], the former for support and possibly free nicotine patches/gum and support; the latter for [facility] Smoking cessation class. Get rid of all smoking supplies:  Cigarettes, lighters, ashtrays--no stashes just in case at home if you are serious.  Recommend nicotine patches:  14 mg to skin and change daily for 28 days, then down to 7 mg patches for 14 days, then stop. Get rid of all cigarettes, lighters, ash trays, smoking paraphernalia before start first patch  Today, you promised to be down to 3 cigarettes daily when you return.  Option for breakfast: [place] yogurt with Mueslix instead of granola and fruit. Boiled egg  Drink a glass of water before every meal Drink 6-8 glasses of water daily Eat three meals daily Eat a protein and healthy fat with every meal (eggs,fish, chicken, [place] and limit red meats) Eat 5 servings of vegetables daily, mix the colors Eat 2 servings of fruit daily with skin, if skin is edible Use smaller plates Put food/utensils down as you chew and swallow each bite Eat at a table with friends/family at least once daily, no TV Do not eat in front of the TV  Recent studies show that people who consume all of their calories in a 12 hour period lose weight more efficiently.  For example, if you eat your first meal at 7:00 a.m., your last meal of the day should be completed by 7:00 p.m.

## 2017-02-27 LAB — CYTOLOGY - PAP

## 2017-02-28 LAB — GC/CHLAMYDIA PROBE AMP
CHLAMYDIA, DNA PROBE: NEGATIVE
Neisseria gonorrhoeae by PCR: NEGATIVE

## 2017-03-05 ENCOUNTER — Emergency Department (HOSPITAL_COMMUNITY)
Admission: EM | Admit: 2017-03-05 | Discharge: 2017-03-05 | Disposition: A | Discharge status: Home / Self Care | Payer: Medicaid Other | Attending: Emergency Medicine | Admitting: Emergency Medicine

## 2017-03-05 ENCOUNTER — Encounter (HOSPITAL_COMMUNITY): Payer: Self-pay | Admitting: Emergency Medicine

## 2017-03-05 ENCOUNTER — Other Ambulatory Visit: Payer: Self-pay

## 2017-03-05 DIAGNOSIS — R112 Nausea with vomiting, unspecified: Secondary | ICD-10-CM | POA: Diagnosis present

## 2017-03-05 DIAGNOSIS — R197 Diarrhea, unspecified: Secondary | ICD-10-CM | POA: Diagnosis not present

## 2017-03-05 DIAGNOSIS — R0981 Nasal congestion: Secondary | ICD-10-CM | POA: Insufficient documentation

## 2017-03-05 DIAGNOSIS — R51 Headache: Secondary | ICD-10-CM | POA: Insufficient documentation

## 2017-03-05 DIAGNOSIS — R05 Cough: Secondary | ICD-10-CM | POA: Diagnosis not present

## 2017-03-05 DIAGNOSIS — R509 Fever, unspecified: Secondary | ICD-10-CM | POA: Insufficient documentation

## 2017-03-05 DIAGNOSIS — R059 Cough, unspecified: Secondary | ICD-10-CM

## 2017-03-05 DIAGNOSIS — R7303 Prediabetes: Secondary | ICD-10-CM | POA: Insufficient documentation

## 2017-03-05 DIAGNOSIS — Z7984 Long term (current) use of oral hypoglycemic drugs: Secondary | ICD-10-CM | POA: Diagnosis not present

## 2017-03-05 DIAGNOSIS — I10 Essential (primary) hypertension: Secondary | ICD-10-CM | POA: Insufficient documentation

## 2017-03-05 DIAGNOSIS — F17210 Nicotine dependence, cigarettes, uncomplicated: Secondary | ICD-10-CM | POA: Diagnosis not present

## 2017-03-05 LAB — I-STAT BETA HCG BLOOD, ED (MC, WL, AP ONLY): I-stat hCG, quantitative: <5 m[IU]/mL (ref <5)

## 2017-03-05 LAB — CBC
HCT: 37.6 % (ref 36.0–46.0)
Hemoglobin: 12.6 g/dL (ref 12.0–15.0)
MCH: 29.8 pg (ref 26.0–34.0)
MCHC: 33.5 g/dL (ref 30.0–36.0)
MCV: 88.9 fL (ref 78.0–100.0)
PLATELETS: 314 10*3/uL (ref 150–400)
RBC: 4.23 MIL/uL (ref 3.87–5.11)
RDW: 14.4 % (ref 11.5–15.5)
WBC: 9.7 10*3/uL (ref 4.0–10.5)

## 2017-03-05 LAB — COMPREHENSIVE METABOLIC PANEL
ALT: 22 U/L (ref 14–54)
AST: 25 U/L (ref 15–41)
Albumin: 4 g/dL (ref 3.5–5.0)
Alkaline Phosphatase: 83 U/L (ref 38–126)
Anion gap: 11 (ref 5–15)
BUN: 9 mg/dL (ref 6–20)
CHLORIDE: 103 mmol/L (ref 101–111)
CO2: 22 mmol/L (ref 22–32)
CREATININE: 1.04 mg/dL — AB (ref 0.44–1.00)
Calcium: 9.1 mg/dL (ref 8.9–10.3)
GFR calc non Af Amer: >60 mL/min (ref >60)
Glucose, Bld: 96 mg/dL (ref 65–99)
Potassium: 3.7 mmol/L (ref 3.5–5.1)
Sodium: 136 mmol/L (ref 135–145)
Total Bilirubin: 0.6 mg/dL (ref 0.3–1.2)
Total Protein: 7.5 g/dL (ref 6.5–8.1)

## 2017-03-05 LAB — LIPASE, BLOOD: LIPASE: 28 U/L (ref 11–51)

## 2017-03-05 LAB — INFLUENZA PANEL BY PCR (TYPE A & B)
INFLBPCR: NEGATIVE
Influenza A By PCR: NEGATIVE

## 2017-03-05 MED ORDER — ONDANSETRON 4 MG PO TBDP: 4.0000 mg | ORAL_TABLET | Freq: Once | ORAL | Status: DC | PRN

## 2017-03-05 MED ORDER — ONDANSETRON 4 MG PO TBDP: 4.0000 mg | ORAL_TABLET | Freq: Three times a day (TID) | ORAL | 0 refills | Status: DC | PRN

## 2017-03-05 MED ORDER — FLUTICASONE PROPIONATE 50 MCG/ACT NA SUSP: 1.0000 | Freq: Every day | NASAL | 2 refills | Status: DC

## 2017-03-05 MED ORDER — DM-GUAIFENESIN ER 30-600 MG PO TB12: 1.0000 | ORAL_TABLET | Freq: Two times a day (BID) | ORAL | 0 refills | Status: AC

## 2017-03-05 MED ORDER — ONDANSETRON 4 MG PO TBDP
4.0000 mg | ORAL_TABLET | Freq: Once | ORAL | Status: AC
  Administered 2017-03-05: 4 mg via ORAL
  Filled 2017-03-05: qty 1

## 2017-03-05 MED ORDER — OSELTAMIVIR PHOSPHATE 75 MG PO CAPS: 75.0000 mg | ORAL_CAPSULE | Freq: Two times a day (BID) | ORAL | 0 refills | Status: DC

## 2017-03-05 NOTE — ED Notes (Addendum)
Pt is alert and orinted x 4 and is verbally responsive. Pt reports that she has n/V/D x 2 days. Pt reports that she took Nightquil/dayquil for symptoms but has had no relief. Pt reports feeling weak and not having strength. Pt reports productive cough.

## 2017-03-05 NOTE — ED Triage Notes (Signed)
Patient c/o vomiting, diarrhea, productive cough and body aches with congestion, chills and sweats for couple days.

## 2017-03-05 NOTE — Discharge Instructions (Signed)
Please read and follow all provided instructions.  Your diagnoses today include:  1. Non-intractable vomiting with nausea, unspecified vomiting type   2. Diarrhea, unspecified type     Tests performed today include: Blood counts and electrolytes Blood tests to check liver and kidney function Blood tests to check pancreas function Urine test to look for infection and pregnancy (in women) Vital signs. See below for your results today.   Medications prescribed:   Take any prescribed medications only as directed.  Zofran.  You may take this 3 times a day as needed for nausea.  You may take the Imodium that you have at home as prescribed on the packaging to relieve your diarrhea.  Tamiflu. This medicine is for influenza treatment. Please check your MyChart in the morning before filling the prescription.  Mucinex Dm.  This is a medicine to shake up mucus in the lungs.  As long as you are checking her home blood pressure, you may also take Sudafed.  Flonase is to help relieve congestion.  Please place 1 spray in each nostril in the morning.  Home care instructions:  Follow any educational materials contained in this packet.  Your abdominal pain, nausea, vomiting, and diarrhea may be caused by a viral gastroenteritis also called 'stomach flu'. You should rest for the next several days. Keep drinking plenty of fluids and use the medicine for nausea as directed.   Drink clear liquids for the next 24 hours and introduce solid foods slowly after 24 hours using the b.r.a.t. diet (Bananas, Rice, Applesauce, Toast, Yogurt).    Follow-up instructions: Please follow-up with your primary care provider in the next 2 days for further evaluation of your symptoms. If you are not feeling better in 48 hours you may have a condition that is more serious and you need re-evaluation.   Please do not return to work until you are vomiting and diarrhea has resolved.  Return instructions:  SEEK IMMEDIATE  MEDICAL ATTENTION IF: If you have pain that does not go away or becomes severe  A temperature above 101F develops  Repeated vomiting occurs (multiple episodes)  If you have pain that becomes localized to portions of the abdomen. The right side could possibly be appendicitis. In an adult, the left lower portion of the abdomen could be colitis or diverticulitis.  Blood is being passed in stools or vomit (bright red or black tarry stools)  You develop chest pain, difficulty breathing, dizziness or fainting, or become confused, poorly responsive, or inconsolable (young children) If you have any other emergent concerns regarding your health  Additional Information: Abdominal (belly) pain can be caused by many things. Your caregiver performed an examination and possibly ordered blood/urine tests and imaging (CT scan, x-rays, ultrasound). Many cases can be observed and treated at home after initial evaluation in the emergency department. Even though you are being discharged home, abdominal pain can be unpredictable. Therefore, you need a repeated exam if your pain does not resolve, returns, or worsens. Most patients with abdominal pain don't have to be admitted to the hospital or have surgery, but serious problems like appendicitis and gallbladder attacks can start out as nonspecific pain. Many abdominal conditions cannot be diagnosed in one visit, so follow-up evaluations are very important.  Your vital signs today were: BP (!) 141/98 (BP Location: Right Arm)    Pulse 81    Temp 98.7 F (37.1 C) (Oral)    Resp 16    Ht 5\' 6"  (1.676 m)  Wt 104.3 kg (230 lb)    LMP  (LMP Unknown)    SpO2 99%    BMI 37.12 kg/m  If your blood pressure (bp) was elevated above 135/85 this visit, please have this repeated by your doctor within one month. -------------- Influenza is a viral respiratory illness that is most active in the community between November-March each Winter.  The most common symptoms in adults are  fever, cough, sore throat, nasal congestion, headache, and muscle aches and pains. Some patients also get vomiting and diarrhea.   The acute phase of the illness can last 2-5 days during which you may continue to have body aches and low energy.   Influenza testing. Results will be available on your MyChart. Instructions on how to set up MyChart are listed in this packet.  Medications prescribed:    Take any prescribed medications only as prescribed, and any over the counter medications only as directed on the packaging.  Tamiflu (Oseltamivir). This is an antiviral medication. We treat in patients who the Centers for Disease Control and Prevention recommends be treated to prevent secondary complications of influenza. The most common side effects include headache, nausea, and vomiting.  For fever, you may take ibuprofen, a non-steroidal anti-inflammatory agent (NSAID) for pain. You may take 600mg  every 6 hours as needed for pain. If still requiring this medication around the clock for acute pain after 10 days, please see your primary healthcare provider.  Women who are pregnant, breastfeeding, or planning on becoming pregnant should not take non-steroidal anti-inflammatories such as   You may combine this medication with Tylenol, 650 mg every 6 hours, so you are receiving something for pain every 3 hours.  This is not a long-term medication unless under the care and direction of your primary provider. Taking this medication long-term and not under the supervision of a healthcare provider could increase the risk of stomach ulcers, kidney problems, and cardiovascular problems such as high blood pressure.    Home care instructions:  Please follow any educational materials contained in this packet.   Your illness is contagious and can be spread to others, especially during the first 3 or 4 days. It cannot be cured by antibiotics or other medicines. Take basic precautions such as washing your hands  often, covering your mouth when you cough or sneeze, and avoiding public places where you could spread your illness to others.   Do not return to work, school, or other scheduled activities until you are without fever for 24 hours and not requiring ibuprofen (Advil) or acetaminophen (Tylenol) to reduce your fever.  Please continue drinking plenty of fluids.  Use over-the-counter medicines as needed as directed on packaging for symptom relief.  You may also use ibuprofen or tylenol as directed on packaging for pain or fever.  Do not take multiple medicines containing Tylenol or acetaminophen to avoid taking too much of this medication.  Return instructions:  Please return to the Emergency Department if you experience worsening symptoms.  RETURN IMMEDIATELY IF you develop shortness of breath, chest pain, confusion or altered mental status, a new rash, become dizzy, faint, or poorly responsive, or are unable to be cared for at home. Some patients develop bacterial pneumonia as a result of influenza. Signs and symptoms include return of fever, increased production of green or yellow phlegm, chest pain, shortness of breath. This most commonly occurs 4-5 days after your initial infection. You need to be reevaluated if you experience these symptoms. Please return if you have  persistent vomiting and cannot keep down fluids or develop a fever that is not controlled by tylenol or motrin.   Please return if you have any other emergent concerns.

## 2017-03-06 NOTE — ED Provider Notes (Signed)
[facility] DEPT Provider Note   CSN: [id number] Arrival date & time: 03/05/17  1825     History   Chief Complaint Chief Complaint  Patient presents with  . Emesis  . Diarrhea  . Cough    HPI Rebecca Spence is a 37 y.o. female.  HPI  Patient is a 37 year old female with a history of bilateral ovarian cysts, essential hypertension, headaches, hyperlipidemia, and prediabetes on metformin presenting for cough, myalgias, emesis, and diarrhea.  Patient reports that her symptoms have been occurring for 2 days.  Patient reports she has had 5-6 episodes of emesis today mostly after eating, and 3 episodes of diarrhea today.  Patient reports diarrhea is watery without melena or hematochezia.  Patient reports she was febrile yesterday, but has not had any fevers today and has not taken any antipyretics.  Patient denies any focal abdominal tenderness with her symptoms.  Patient reports she has been congested with purulent rhinorrhea.  Cough productive of green to yellow sputum.  Patient reports a frontal headache that is consistent with prior episodes of respiratory infection where she was congested.  No visual changes or other neurologic changes such as weakness or numbness associated with headache.  Patient has taken Nyquil and Dayquil for her symptoms.  Patient reports that her sister was positive for the flu.  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  Patient did not receive the influenza vaccine this year.  Past Medical History:  Diagnosis Date  . Bilateral ovarian cysts   . Eczema 10/29/2016   Nose and hands/forearms  . Essential hypertension 2016  . Gestational diabetes    09/2016:  prediabetes  . Headache(784.0)   . Hyperlipidemia 10/01/2016  . Prediabetes 10/01/2016   Eye exam with Shirleen Schirmer, M.D. negative for diabetic changes.    Patient Active Problem List   Diagnosis Date Noted  .  Eczema 10/29/2016  . Prediabetes 10/01/2016  . Hyperlipidemia 10/01/2016  . Essential hypertension 01/21/2014    Past Surgical History:  Procedure Laterality Date  . NO PAST SURGERIES      OB History    Gravida Para Term Preterm AB Living   3 2 2  0   2   SAB TAB Ectopic Multiple Live Births           2       Home Medications    Prior to Admission medications   Medication Sig Start Date End Date Taking? Authorizing Provider  hydrochlorothiazide (HYDRODIURIL) 12.5 MG tablet Take 1 tablet (12.5 mg total) by mouth daily. 08/30/16  Yes Mack Hook, MD  medroxyPROGESTERone (DEPO-PROVERA) 150 MG/ML injection Inject 1 mL (150 mg total) once for 1 dose into the muscle. 11/26/16 03/05/17 Yes Mack Hook, MD  metFORMIN (GLUCOPHAGE) 500 MG tablet Take 1 tablet (500 mg total) by mouth 2 (two) times daily with a meal. 02/26/17  Yes Mack Hook, MD  dextromethorphan-guaiFENesin [facility] DM) 30-600 MG 12hr tablet Take 1 tablet by mouth 2 (two) times daily for 7 days. 03/05/17 03/12/17  Langston Masker B, PA-C  fluticasone (FLONASE) 50 MCG/ACT nasal spray Place 1 spray into both nostrils daily. 03/05/17   Langston Masker B, PA-C  ondansetron (ZOFRAN ODT) 4 MG disintegrating tablet Take 1 tablet (4 mg total) by mouth every 8 (eight) hours as needed for nausea or vomiting. 03/05/17   Langston Masker B, PA-C  oseltamivir (TAMIFLU) 75 MG capsule Take 1 capsule (75 mg  total) by mouth every 12 (twelve) hours. 03/05/17   Langston Masker B, PA-C  triamcinolone cream (KENALOG) 0.1 % Apply small amount twice daily to affected areas Patient not taking: Reported on 03/05/2017 10/29/16   Mack Hook, MD    Family History Family History  Problem Relation Age of Onset  . Diabetes Mother   . Hypertension Mother   . Diabetes Maternal Grandmother   . Hyperlipidemia Maternal Grandmother   . Cancer Sister        cervical  . Asthma Son   . Allergies Son   . Eczema Son   . Asthma Son   .  Allergies Son   . Eczema Son     Social History Social History   Tobacco Use  . Smoking status: Current Every Day Smoker    Packs/day: 0.05    Years: 13.00    Pack years: 0.65    Types: Cigarettes  . Smokeless tobacco: Never Used  . Tobacco comment: no time to consider--bums off others.  Substance Use Topics  . Alcohol use: Yes    Comment: socially-not with pregnancy  . Drug use: No    Comment: No use of drugs currently--2018     Allergies   Orange   Review of Systems Review of Systems  Constitutional: Positive for chills and fever.  HENT: Positive for congestion and rhinorrhea. Negative for sore throat.   Eyes: Negative for visual disturbance.  Respiratory: Positive for cough. Negative for shortness of breath and wheezing.   Cardiovascular: Negative for chest pain.  Gastrointestinal: Positive for diarrhea, nausea and vomiting. Negative for abdominal pain.  Genitourinary: Negative for dysuria.  Musculoskeletal: Positive for myalgias. Negative for neck pain and neck stiffness.  Skin: Negative for rash.  Neurological: Positive for headaches. Negative for speech difficulty, weakness and numbness.     Physical Exam Updated Vital Signs BP (!) 141/98 (BP Location: Right Arm)   Pulse 81   Temp 98.7 F (37.1 C) (Oral)   Resp 16   Ht 5\' 6"  (1.676 m)   Wt 104.3 kg (230 lb)   LMP  (LMP Unknown)   SpO2 99%   BMI 37.12 kg/m   Physical Exam  Constitutional: She appears well-developed and well-nourished. No distress.  HENT:  Head: Normocephalic and atraumatic.  Mouth/Throat: Oropharynx is clear and moist.  No frontal or sinus tenderness to palpation.  Eyes: Conjunctivae and EOM are normal. Pupils are equal, round, and reactive to light.  Neck: Normal range of motion. Neck supple.  Cardiovascular: Normal rate, regular rhythm, S1 normal and S2 normal.  No murmur heard. Pulmonary/Chest: Effort normal and breath sounds normal. She has no wheezes. She has no rales.    Abdominal: Soft. She exhibits no distension. There is no tenderness. There is no guarding.  Musculoskeletal: Normal range of motion. She exhibits no edema or deformity.  Lymphadenopathy:    She has no cervical adenopathy.  Neurological: She is alert.  Cranial nerves grossly intact. Patient moves extremities symmetrically and with good coordination.  Skin: Skin is warm and dry. No rash noted. No erythema.  Psychiatric: She has a normal mood and affect. Her behavior is normal. Judgment and thought content normal.  Nursing note and vitals reviewed.    ED Treatments / Results  Labs (all labs ordered are listed, but only abnormal results are displayed) Labs Reviewed  COMPREHENSIVE METABOLIC PANEL - Abnormal; Notable for the following components:      Result Value   Creatinine, Ser 1.04 (*)  All other components within normal limits  LIPASE, BLOOD  CBC  INFLUENZA PANEL BY PCR (TYPE A & B)  URINALYSIS, ROUTINE W REFLEX MICROSCOPIC  I-STAT BETA HCG BLOOD, ED ([facility], [facility], [facility] ONLY)    EKG  EKG Interpretation None       Radiology No results found.  Procedures Procedures (including critical care time)  Medications Ordered in ED Medications  ondansetron (ZOFRAN-ODT) disintegrating tablet 4 mg (not administered)  ondansetron (ZOFRAN-ODT) disintegrating tablet 4 mg (4 mg Oral Given 03/05/17 2210)     Initial Impression / Assessment and Plan / ED Course  I have reviewed the triage vital signs and the nursing notes.  Pertinent labs & imaging results that were available during my care of the patient were reviewed by me and considered in my medical decision making (see chart for details).     Patient is nontoxic-appearing, afebrile, non-tachycardic and in no acute distress.  Differential diagnosis includes viral upper respiratory infection with concomitant gastroenteritis, influenza, pneumonia.  Patient has not had active emesis or diarrhea in the emergency department.  Patient  reporting that she does not wish to stay for further evaluation, as her children are at home and she had with her significant other in the waiting room.  I discussed with patient that I recommend she get a chest x-ray, which patient declined.  Lungs are clear given that patient is afebrile at this time, do not suspect pneumonia. Patient also declined IV fluids.  CBC and CMP without abnormality.  Patient is not pregnant.  I discussed with patient that she is to develop any return of fevers or worsening productive cough or chest pain, she should be reevaluated.  Patient swab for influenza, and given that she meets CDC requirements for treatment  with Tamiflu, will provide this prescription.  Patient has my chart and discussed that she will look at my chart before filling the prescription to see if she is positive for influenza.  Patient performed oral challenge after Zofran ODT.  Patient discharged with p.o. Zofran, and symptomatic therapy for her respiratory symptoms including Flonase, Mucinex.  Patient is in understanding and agrees with plan of care.  Patient to follow-up with her PCP.  Final Clinical Impressions(s) / ED Diagnoses   Final diagnoses:  Non-intractable vomiting with nausea, unspecified vomiting type  Diarrhea, unspecified type  Cough    ED Discharge Orders        Ordered    ondansetron (ZOFRAN ODT) 4 MG disintegrating tablet  Every 8 hours PRN     03/05/17 2215    oseltamivir (TAMIFLU) 75 MG capsule  Every 12 hours     03/05/17 2215    dextromethorphan-guaiFENesin (MUCINEX DM) 30-600 MG 12hr tablet  2 times daily     03/05/17 2215    fluticasone (FLONASE) 50 MCG/ACT nasal spray  Daily     03/05/17 2215       Tamala Julian 03/06/17 0052    Lacretia Leigh, MD 03/06/17 1325

## 2017-04-02 ENCOUNTER — Encounter (HOSPITAL_COMMUNITY): Payer: Self-pay | Admitting: Emergency Medicine

## 2017-04-02 ENCOUNTER — Ambulatory Visit: Payer: Self-pay | Admitting: Internal Medicine

## 2017-04-02 ENCOUNTER — Ambulatory Visit (HOSPITAL_COMMUNITY)
Admission: EM | Admit: 2017-04-02 | Discharge: 2017-04-02 | Disposition: A | Discharge status: Home / Self Care | Payer: BLUE CROSS/BLUE SHIELD | Attending: Family Medicine | Admitting: Family Medicine

## 2017-04-02 ENCOUNTER — Other Ambulatory Visit: Payer: Self-pay

## 2017-04-02 DIAGNOSIS — J40 Bronchitis, not specified as acute or chronic: Secondary | ICD-10-CM

## 2017-04-02 MED ORDER — PREDNISONE 20 MG PO TABS: ORAL_TABLET | ORAL | 0 refills | Status: DC

## 2017-04-02 MED ORDER — HYDROCOD POLST-CPM POLST ER 10-8 MG/5ML PO SUER: 5.0000 mL | Freq: Two times a day (BID) | ORAL | 0 refills | Status: DC | PRN

## 2017-04-02 NOTE — ED Triage Notes (Signed)
Pt reports cough, congestion and chest tightness for several weeks.  Pt was seen in an ED a few weeks ago, tested negative for flu, but was given tamiflu, zofran and nasal spray.  Pt states she has not been able to get rid of the congestion and she has chills as well.

## 2017-04-02 NOTE — ED Provider Notes (Addendum)
[facility]   [id number] 04/02/17 Arrival Time: 1407   SUBJECTIVE:  Rebecca Spence is a 37 y.o. female who presents to the urgent care with complaint of cough, congestion and chest tightness for several weeks.  Pt was seen in an ED a few weeks ago, tested negative for flu, but was given tamiflu, zofran and nasal spray.  Pt states she has not been able to get rid of the congestion and she has chills as well.  Past Medical History:  Diagnosis Date  . Bilateral ovarian cysts   . Eczema 10/29/2016   Nose and hands/forearms  . Essential hypertension 2016  . Gestational diabetes    09/2016:  prediabetes  . Headache(784.0)   . Hyperlipidemia 10/01/2016  . Prediabetes 10/01/2016   Eye exam with Shirleen Schirmer, M.D. negative for diabetic changes.   Family History  Problem Relation Age of Onset  . Diabetes Mother   . Hypertension Mother   . Diabetes Maternal Grandmother   . Hyperlipidemia Maternal Grandmother   . Cancer Sister        cervical  . Asthma Son   . Allergies Son   . Eczema Son   . Asthma Son   . Allergies Son   . Eczema Son    Social History   Socioeconomic History  . Marital status: Single    Spouse name: Not on file  . Number of children: 2  . Years of education: associates degree working on.  Has GED  . Highest education level: Not on file  Social Needs  . Financial resource strain: Not on file  . Food insecurity - worry: Not on file  . Food insecurity - inability: Not on file  . Transportation needs - medical: Not on file  . Transportation needs - non-medical: Not on file  Occupational History  . 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 Smoking status: Current Every Day Smoker    Packs/day: 0.05    Years: 13.00    Pack years: 0.65    Types: Cigarettes  . Smokeless tobacco: Never Used  . Tobacco comment: no time to consider--bums off others.  Substance and Sexual Activity  . Alcohol use: Yes    Comment: socially-not with  pregnancy  . Drug use: No    Comment: No use of drugs currently--2018  . Sexual activity: Yes    Birth control/protection: Injection, Condom  Other Topics Concern  . Not on file  Social History Narrative   Born and raised in [place].  Lives close to [employer] with 2 sons.  Father involved.   Mother takes care of children as well.   Current Meds  Medication Sig  . fluticasone (FLONASE) 50 MCG/ACT nasal spray Place 1 spray into both nostrils daily.  . hydrochlorothiazide (HYDRODIURIL) 12.5 MG tablet Take 1 tablet (12.5 mg total) by mouth daily.  . metFORMIN (GLUCOPHAGE) 500 MG tablet Take 1 tablet (500 mg total) by mouth 2 (two) times daily with a meal.  . ondansetron (ZOFRAN ODT) 4 MG disintegrating tablet Take 1 tablet (4 mg total) by mouth every 8 (eight) hours as needed for nausea or vomiting.  Marland Kitchen oseltamivir (TAMIFLU) 75 MG capsule Take 1 capsule (75 mg total) by mouth every 12 (twelve) hours.   Allergies  Allergen Reactions  . Orange Anaphylaxis and Hives    "My throat starts closing up, my tongue, lips started swelling & burning.  Similar reaction to onions.      ROS: As per  HPI, remainder of ROS negative.   OBJECTIVE:   Vitals:   04/02/17 1526  BP: (!) 142/96  Pulse: 80  Temp: 98.9 F (37.2 C)  TempSrc: Oral  SpO2: 100%     General appearance: alert; no distress Eyes: PERRL; EOMI; conjunctiva normal HENT: normocephalic; atraumatic; TMs normal, canal normal, external ears normal without trauma; nasal mucosa normal; oral mucosa normal Neck: supple Lungs: clear to auscultation bilaterally Heart: regular rate and rhythm Back: no CVA tenderness Extremities: no cyanosis or edema; symmetrical with no gross deformities Skin: warm and dry Neurologic: normal gait; grossly normal Psychological: alert and cooperative; normal mood and affect      Labs:  Results for orders placed or performed during the hospital encounter of 03/05/17  Lipase, blood    Result Value Ref Range   Lipase 28 11 - 51 U/L  Comprehensive metabolic panel  Result Value Ref Range   Sodium 136 135 - 145 mmol/L   Potassium 3.7 3.5 - 5.1 mmol/L   Chloride 103 101 - 111 mmol/L   CO2 22 22 - 32 mmol/L   Glucose, Bld 96 65 - 99 mg/dL   BUN 9 6 - 20 mg/dL   Creatinine, Ser 1.04 (H) 0.44 - 1.00 mg/dL   Calcium 9.1 8.9 - 10.3 mg/dL   Total Protein 7.5 6.5 - 8.1 g/dL   Albumin 4.0 3.5 - 5.0 g/dL   AST 25 15 - 41 U/L   ALT 22 14 - 54 U/L   Alkaline Phosphatase 83 38 - 126 U/L   Total Bilirubin 0.6 0.3 - 1.2 mg/dL   GFR calc non Af Amer >60 >60 mL/min   GFR calc Af Amer >60 >60 mL/min   Anion gap 11 5 - 15  CBC  Result Value Ref Range   WBC 9.7 4.0 - 10.5 K/uL   RBC 4.23 3.87 - 5.11 MIL/uL   Hemoglobin 12.6 12.0 - 15.0 g/dL   HCT 37.6 36.0 - 46.0 %   MCV 88.9 78.0 - 100.0 fL   MCH 29.8 26.0 - 34.0 pg   MCHC 33.5 30.0 - 36.0 g/dL   RDW 14.4 11.5 - 15.5 %   Platelets 314 150 - 400 K/uL  Influenza panel by PCR (type A & B)  Result Value Ref Range   Influenza A By PCR NEGATIVE NEGATIVE   Influenza B By PCR NEGATIVE NEGATIVE  I-Stat beta hCG blood, ED  Result Value Ref Range   I-stat hCG, quantitative <5.0 <5 mIU/mL   Comment 3            Labs Reviewed - No data to display  No results found.     ASSESSMENT & PLAN:  1. Bronchitis     Meds ordered this encounter  Medications  . predniSONE (DELTASONE) 20 MG tablet    Sig: Two daily with food    Dispense:  10 tablet    Refill:  0  . chlorpheniramine-HYDROcodone (TUSSIONEX PENNKINETIC ER) 10-8 MG/5ML SUER    Sig: Take 5 mLs by mouth every 12 (twelve) hours as needed for cough.    Dispense:  100 mL    Refill:  0    Reviewed expectations re: course of current medical issues. Questions answered. Outlined signs and symptoms indicating need for more acute intervention. Patient verbalized understanding. After Visit Summary given.     Robyn Haber, MD 04/02/17 1542    Robyn Haber,  MD 04/02/17 [phone]

## 2017-05-26 ENCOUNTER — Other Ambulatory Visit: Payer: Self-pay

## 2017-05-26 ENCOUNTER — Other Ambulatory Visit (INDEPENDENT_AMBULATORY_CARE_PROVIDER_SITE_OTHER): Payer: Medicaid Other

## 2017-05-26 DIAGNOSIS — E119 Type 2 diabetes mellitus without complications: Secondary | ICD-10-CM | POA: Diagnosis not present

## 2017-05-26 DIAGNOSIS — Z309 Encounter for contraceptive management, unspecified: Secondary | ICD-10-CM | POA: Diagnosis not present

## 2017-05-26 DIAGNOSIS — E785 Hyperlipidemia, unspecified: Secondary | ICD-10-CM | POA: Diagnosis not present

## 2017-05-26 LAB — POCT URINE PREGNANCY: Preg Test, Ur: NEGATIVE

## 2017-05-26 MED ORDER — MEDROXYPROGESTERONE ACETATE 150 MG/ML IM SUSP: 150.0000 mg | Freq: Once | INTRAMUSCULAR | 1 refills | Status: DC

## 2017-05-26 MED ORDER — MEDROXYPROGESTERONE ACETATE 150 MG/ML IM SUSP
150.0000 mg | Freq: Once | INTRAMUSCULAR | Status: AC
  Administered 2017-05-26: 150 mg via INTRAMUSCULAR

## 2017-05-27 LAB — LIPID PANEL W/O CHOL/HDL RATIO
CHOLESTEROL TOTAL: 189 mg/dL (ref 100–199)
HDL: 36 mg/dL — AB (ref >39)
LDL Calculated: 118 mg/dL — ABNORMAL HIGH (ref 0–99)
TRIGLYCERIDES: 174 mg/dL — AB (ref 0–149)
VLDL Cholesterol Cal: 35 mg/dL (ref 5–40)

## 2017-05-27 LAB — HGB A1C W/O EAG: Hgb A1c MFr Bld: 6.3 % — ABNORMAL HIGH (ref 4.8–5.6)

## 2017-05-28 ENCOUNTER — Ambulatory Visit: Payer: Medicaid Other | Admitting: Internal Medicine

## 2017-05-28 ENCOUNTER — Encounter: Payer: Self-pay | Admitting: Internal Medicine

## 2017-05-28 VITALS — BP 110/70 | HR 90 | Resp 14 | Ht 66.0 in | Wt 236.0 lb

## 2017-05-28 DIAGNOSIS — I10 Essential (primary) hypertension: Secondary | ICD-10-CM | POA: Diagnosis not present

## 2017-05-28 DIAGNOSIS — E785 Hyperlipidemia, unspecified: Secondary | ICD-10-CM | POA: Diagnosis not present

## 2017-05-28 DIAGNOSIS — M62838 Other muscle spasm: Secondary | ICD-10-CM | POA: Diagnosis not present

## 2017-05-28 MED ORDER — CYCLOBENZAPRINE HCL 5 MG PO TABS: ORAL_TABLET | ORAL | 1 refills | Status: DC

## 2017-05-28 NOTE — Progress Notes (Signed)
   Subjective:    Patient ID: Rebecca Spence, female    DOB: 02-22-1980, 37 y.o.   MRN: [id number]  HPI   1.  DM:  With Metformin 500 mg twice daily, her A1C is down to 6.3%.  She has gained 6 lbs back.    2.  Dyslipidemia:  Not at goal.  Lipid Panel     Component Value Date/Time   CHOL 189 05/26/2017 1017   TRIG 174 (H) 05/26/2017 1017   HDL 36 (L) 05/26/2017 1017   LDLCALC 118 (H) 05/26/2017 1017   Up at 5 a.m.   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 Leaves school at 1:00 p.m. Then back and forth to pick up and drop off kids. School is from 6-10 p.m.  Gets home and eats around 11:00p.m.  Bed at 1-1:30 a.m. Has 8 more months of school.  May eat chips here and there. Pistachios while watching kids play sports.  3.  Insomnia:  Problems falling asleep.  Lies in bed thinking of things to do.  Does like to read.  She is so tired with all her activities.   4.  Traps tight bilaterally.  Taking Aleve 2 tabs, helps, but taking several times daily.   Started after lifting pallets of juice boxes at work.  The next day, increased pain.  1-2 weeks ago. No numbness, tingling or weakness of upper extrems  Current Meds  Medication Sig  . fluticasone (FLONASE) 50 MCG/ACT nasal spray Place 1 spray into both nostrils daily.  . hydrochlorothiazide (HYDRODIURIL) 12.5 MG tablet Take 1 tablet (12.5 mg total) by mouth daily.  . metFORMIN (GLUCOPHAGE) 500 MG tablet Take 1 tablet (500 mg total) by mouth 2 (two) times daily with a meal.    Review of Systems     Objective:   Physical Exam NAD Eyes injected and watery, tired appearing. Neck:  Supple, NT over spinous processes.  Tender over traps bilaterally. Chest:  CTA CV:  RRR without murmur or rub.        Assessment & Plan:  1.  DM:  Back to prediabetic range with A1C.  To work on organizing small meals each Sunday to grab and go with  her so not eating chips at work. Discussed small bag of nuts with piece of fruit and Edamame or soybeans with a boiled egg.   She is very picky about what she eats.  2. Dyslipidemia:  As above.  No medication for now, but discussed needs to make changes with lifestyle to get LDL down to below 70.  3.  Insomnia:  Read when unable to fall asleep to shut down thoughts.    4.  Trap muscle spasm and pain:  Cyclobenzaprine 5 mg 1/2 to 1 tab at bedtime.  Only Take 2 tabs of Aleve twice daily with meals.  Followup in 3 months for FLP and then with me.

## 2017-07-09 ENCOUNTER — Emergency Department (HOSPITAL_COMMUNITY)
Admission: EM | Admit: 2017-07-09 | Discharge: 2017-07-09 | Disposition: A | Discharge status: Home / Self Care | Payer: Medicaid Other | Attending: Emergency Medicine | Admitting: Emergency Medicine

## 2017-07-09 ENCOUNTER — Encounter (HOSPITAL_COMMUNITY): Payer: Self-pay | Admitting: Emergency Medicine

## 2017-07-09 ENCOUNTER — Emergency Department (HOSPITAL_COMMUNITY): Payer: Medicaid Other

## 2017-07-09 DIAGNOSIS — Z79899 Other long term (current) drug therapy: Secondary | ICD-10-CM | POA: Insufficient documentation

## 2017-07-09 DIAGNOSIS — I10 Essential (primary) hypertension: Secondary | ICD-10-CM | POA: Insufficient documentation

## 2017-07-09 DIAGNOSIS — B001 Herpesviral vesicular dermatitis: Secondary | ICD-10-CM | POA: Diagnosis not present

## 2017-07-09 DIAGNOSIS — Z7984 Long term (current) use of oral hypoglycemic drugs: Secondary | ICD-10-CM | POA: Insufficient documentation

## 2017-07-09 DIAGNOSIS — Z3202 Encounter for pregnancy test, result negative: Secondary | ICD-10-CM | POA: Diagnosis not present

## 2017-07-09 DIAGNOSIS — R5383 Other fatigue: Secondary | ICD-10-CM | POA: Insufficient documentation

## 2017-07-09 DIAGNOSIS — F17210 Nicotine dependence, cigarettes, uncomplicated: Secondary | ICD-10-CM | POA: Insufficient documentation

## 2017-07-09 DIAGNOSIS — R21 Rash and other nonspecific skin eruption: Secondary | ICD-10-CM | POA: Diagnosis present

## 2017-07-09 LAB — COMPREHENSIVE METABOLIC PANEL
ALT: 22 U/L (ref 14–54)
ANION GAP: 12 (ref 5–15)
AST: 20 U/L (ref 15–41)
Albumin: 4.3 g/dL (ref 3.5–5.0)
Alkaline Phosphatase: 87 U/L (ref 38–126)
BUN: 9 mg/dL (ref 6–20)
CO2: 26 mmol/L (ref 22–32)
CREATININE: 1.16 mg/dL — AB (ref 0.44–1.00)
Calcium: 9.8 mg/dL (ref 8.9–10.3)
Chloride: 101 mmol/L (ref 101–111)
GFR, EST NON AFRICAN AMERICAN: 59 mL/min — AB (ref >60)
Glucose, Bld: 90 mg/dL (ref 65–99)
Potassium: 3.6 mmol/L (ref 3.5–5.1)
Sodium: 139 mmol/L (ref 135–145)
TOTAL PROTEIN: 7.5 g/dL (ref 6.5–8.1)
Total Bilirubin: 1 mg/dL (ref 0.3–1.2)

## 2017-07-09 LAB — CBC WITH DIFFERENTIAL/PLATELET
Abs Immature Granulocytes: 0.1 10*3/uL (ref 0.0–0.1)
BASOS ABS: 0.1 10*3/uL (ref 0.0–0.1)
BASOS PCT: 1 %
EOS ABS: 0.1 10*3/uL (ref 0.0–0.7)
Eosinophils Relative: 1 %
HCT: 42.2 % (ref 36.0–46.0)
Hemoglobin: 13.4 g/dL (ref 12.0–15.0)
IMMATURE GRANULOCYTES: 1 %
Lymphocytes Relative: 39 %
Lymphs Abs: 3.8 10*3/uL (ref 0.7–4.0)
MCH: 28.3 pg (ref 26.0–34.0)
MCHC: 31.8 g/dL (ref 30.0–36.0)
MCV: 89 fL (ref 78.0–100.0)
Monocytes Absolute: 0.7 10*3/uL (ref 0.1–1.0)
Monocytes Relative: 8 %
NEUTROS PCT: 50 %
Neutro Abs: 4.9 10*3/uL (ref 1.7–7.7)
PLATELETS: 353 10*3/uL (ref 150–400)
RBC: 4.74 MIL/uL (ref 3.87–5.11)
RDW: 14.3 % (ref 11.5–15.5)
WBC: 9.7 10*3/uL (ref 4.0–10.5)

## 2017-07-09 LAB — MONONUCLEOSIS SCREEN: MONO SCREEN: NEGATIVE

## 2017-07-09 LAB — PREGNANCY, URINE: PREG TEST UR: NEGATIVE

## 2017-07-09 LAB — TSH: TSH: 0.988 u[IU]/mL (ref 0.350–4.500)

## 2017-07-09 LAB — CBG MONITORING, ED: Glucose-Capillary: 85 mg/dL (ref 65–99)

## 2017-07-09 MED ORDER — IOHEXOL 300 MG/ML  SOLN
75.0000 mL | Freq: Once | INTRAMUSCULAR | Status: AC | PRN
  Administered 2017-07-09: 75 mL via INTRAVENOUS

## 2017-07-09 MED ORDER — IOPAMIDOL (ISOVUE-300) INJECTION 61%: 75.0000 mL | Freq: Once | INTRAVENOUS | Status: DC | PRN

## 2017-07-09 MED ORDER — VALACYCLOVIR HCL 1 G PO TABS: 2000.0000 mg | ORAL_TABLET | Freq: Two times a day (BID) | ORAL | 0 refills | Status: AC

## 2017-07-09 MED ORDER — ONDANSETRON 4 MG PO TBDP
8.0000 mg | ORAL_TABLET | Freq: Once | ORAL | Status: DC
Start: 2017-07-09 — End: 2017-07-09
  Filled 2017-07-09: qty 2

## 2017-07-09 MED ORDER — SODIUM CHLORIDE 0.9 % IV BOLUS
1000.0000 mL | Freq: Once | INTRAVENOUS | Status: AC
  Administered 2017-07-09: 1000 mL via INTRAVENOUS

## 2017-07-09 NOTE — ED Provider Notes (Signed)
Patient placed in Quick Look pathway, seen and evaluated   Chief Complaint: +fatigue, malaise  HPI:   37 year old female presents with generalized malaise, fatigue, body aches. Symptoms started two days ago. She hasn't been able to get out of bed. She reports a couple episodes of N/V. She also has developed a rash on her nose and side of her mouth. She thinks it might be herpes. She's also had some swelling under her chin which is painful. No chest pain, SOB, cough, abdominal pain, diarrhea, dysuria. She denies any tick bites. No fever    ROS: +fatigue, rash, +nausea  Physical Exam:   Gen: No distress, Tearful  Neuro: Awake and Alert  Skin: Warm    Focused Exam: Papular rash on the nose and side of the mouth on the R side. Tender mass under the chin. No swelling or erythema of the throat      Initiation of care has begun. The patient has been counseled on the process, plan, and necessity for staying for the completion/evaluation, and the remainder of the medical screening examination    Recardo Evangelist, PA-C 07/09/17 1654    Isla Pence, MD 07/12/17 [phone]

## 2017-07-09 NOTE — ED Notes (Signed)
Patient transported to CT 

## 2017-07-09 NOTE — ED Notes (Signed)
Patient ambulatory to bathroom with steady gait at this time 

## 2017-07-09 NOTE — ED Notes (Signed)
ED Provider at bedside. 

## 2017-07-09 NOTE — ED Triage Notes (Signed)
Pt presents to ER after not feeling well since Monday, laying around house, no energy, no appetite; new lump/mass under chin, rash on nose; pt denies any sickness in her household, no new products, pt states she has hx of diabetes and HTN, not missed any doses

## 2017-07-09 NOTE — ED Notes (Signed)
E-Signature not available. Patient verbalizes understanding of discharge instructions, no further questions at this time.

## 2017-07-09 NOTE — ED Provider Notes (Signed)
[name]   CSN: [id number] Arrival date & time: 07/09/17  1602     History   Chief Complaint Chief Complaint  Patient presents with  . Generalized Body Aches  . Mass    neck  . Anorexia    HPI Rebecca Spence is a 37 y.o. female.  HPI Patient presents with has a rash on her nose and her right lip.  History of cold sores.  No fevers.  Decreased appetite.  No injury.  No confusion.  States she just had no energy.  No sick contacts.  States she had recent dental work but all he did was a cleaning.  States she has pain below her lower jaw.  Has not had episodes like this before.  No weight loss.  No vision changes or eye pain. Past Medical History:  Diagnosis Date  . Bilateral ovarian cysts   . Eczema 10/29/2016   Nose and hands/forearms  . Essential hypertension 2016  . Gestational diabetes    09/2016:  prediabetes  . Headache(784.0)   . Hyperlipidemia 10/01/2016  . Prediabetes 10/01/2016   Eye exam with Shirleen Schirmer, M.D. negative for diabetic changes.    Patient Active Problem List   Diagnosis Date Noted  . Eczema 10/29/2016  . Prediabetes 10/01/2016  . Hyperlipidemia 10/01/2016  . Essential hypertension 01/21/2014    Past Surgical History:  Procedure Laterality Date  . NO PAST SURGERIES       OB History    Gravida  3   Para  2   Term  2   Preterm  0   AB      Living  2     SAB      TAB      Ectopic      Multiple      Live Births  2            Home Medications    Prior to Admission medications   Medication Sig Start Date End Date Taking? Authorizing Provider  fluticasone (FLONASE) 50 MCG/ACT nasal spray Place 1 spray into both nostrils daily. 03/05/17  Yes Valere Dross, Alyssa B, PA-C  hydrochlorothiazide (HYDRODIURIL) 12.5 MG tablet Take 1 tablet (12.5 mg total) by mouth daily. 08/30/16  Yes Mack Hook, MD  medroxyPROGESTERone (DEPO-PROVERA) 150 MG/ML injection Inject 1 mL (150  mg total) into the muscle once for 1 dose. 05/26/17 07/09/17 Yes Mack Hook, MD  metFORMIN (GLUCOPHAGE) 500 MG tablet Take 1 tablet (500 mg total) by mouth 2 (two) times daily with a meal. 02/26/17  Yes Mack Hook, MD  cyclobenzaprine (FLEXERIL) 5 MG tablet 1/2 to 1 tab by mouth at bedtime as needed. Patient taking differently: Take 5 mg by mouth at bedtime.  05/28/17   Mack Hook, MD  valACYclovir (VALTREX) 1000 MG tablet Take 2 tablets (2,000 mg total) by mouth 2 (two) times daily for 14 days. 07/09/17 07/23/17  Davonna Belling, MD    Family History Family History  Problem Relation Age of Onset  . Diabetes Mother   . Hypertension Mother   . Diabetes Maternal Grandmother   . Hyperlipidemia Maternal Grandmother   . Cancer Sister        cervical  . Asthma Son   . Allergies Son   . Eczema Son   . Asthma Son   . Allergies Son   . Eczema Son     Social History Social History   Tobacco Use  . Smoking status:  Current Every Day Smoker    Packs/day: 0.05    Years: 13.00    Pack years: 0.65    Types: Cigarettes  . Smokeless tobacco: Never Used  . Tobacco comment: no time to consider--bums off others.  Substance Use Topics  . Alcohol use: Yes    Comment: socially-not with pregnancy  . Drug use: No    Types: Marijuana    Comment: No use of drugs currently--2018     Allergies   Orange   Review of Systems Review of Systems  Constitutional: Positive for appetite change and fatigue. Negative for fever.  HENT: Negative for facial swelling, sore throat, trouble swallowing and voice change.   Eyes: Negative for photophobia.  Respiratory: Negative for shortness of breath.   Gastrointestinal: Positive for nausea and vomiting. Negative for abdominal pain.  Genitourinary: Negative for flank pain.  Musculoskeletal: Negative for back pain.  Neurological: Positive for weakness.  Hematological: Negative for adenopathy.  Psychiatric/Behavioral: Negative for  confusion.     Physical Exam Updated Vital Signs BP (!) 178/107 (BP Location: Right Arm)   Pulse 99   Temp 98.3 F (36.8 C) (Oral)   Resp 16   Ht 5\' 6"  (1.676 m)   Wt 96.2 kg (212 lb)   SpO2 100%   BMI 34.22 kg/m    Physical Exam  Constitutional: She appears well-developed.  HENT:  Head: Atraumatic.  Vesicular lesions on anterior nose.  Also lesion right lateral lips.  Eyes: EOM are normal.  Neck: Neck supple.  Tenderness below mandible without frank swelling.  Cardiovascular: Normal rate.  Pulmonary/Chest: Effort normal.  Abdominal: Soft. There is no tenderness.  Musculoskeletal: She exhibits no edema.  Neurological: She is alert.  Skin: Skin is warm. Capillary refill takes less than 2 seconds.  Psychiatric:  Patient is initially tearful.     ED Treatments / Results  Labs (all labs ordered are listed, but only abnormal results are displayed) Labs Reviewed  COMPREHENSIVE METABOLIC PANEL - Abnormal; Notable for the following components:      Result Value   Creatinine, Ser 1.16 (*)    GFR calc non Af Amer 59 (*)    All other components within normal limits  CBC WITH DIFFERENTIAL/PLATELET  MONONUCLEOSIS SCREEN  TSH  PREGNANCY, URINE  CBG MONITORING, ED    EKG None  Radiology Ct Soft Tissue Neck W Contrast  Result Date: 07/09/2017 CLINICAL DATA:  Neck mass, solitary, afebrile. Mass under the chin for 4 days EXAM: CT NECK WITH CONTRAST TECHNIQUE: Multidetector CT imaging of the neck was performed using the standard protocol following the bolus administration of intravenous contrast. CONTRAST:  8mL OMNIPAQUE IOHEXOL 300 MG/ML  SOLN COMPARISON:  None. FINDINGS: Pharynx and larynx: Normal. No mass or swelling. Salivary glands: No inflammation, mass, or stone. Thyroid: Normal. Lymph nodes: None enlarged or abnormal density. Vascular: Negative. Limited intracranial: Negative. Visualized orbits: Negative. Mastoids and visualized paranasal sinuses: Clear. Skeleton: No  acute or aggressive process. Upper chest: Negative. IMPRESSION: Negative neck CT. Electronically Signed   By: Monte Fantasia M.D.   On: 07/09/2017 19:36    Procedures Procedures (including critical care time)  Medications Ordered in ED Medications  ondansetron (ZOFRAN-ODT) disintegrating tablet 8 mg (8 mg Oral Refused 07/09/17 1732)  iopamidol (ISOVUE-300) 61 % injection 75 mL (has no administration in time range)  sodium chloride 0.9 % bolus 1,000 mL (0 mLs Intravenous Stopped 07/09/17 1907)  iohexol (OMNIPAQUE) 300 MG/ML solution 75 mL (75 mLs Intravenous Contrast Given 07/09/17 1854)  Initial Impression / Assessment and Plan / ED Course  I have reviewed the triage vital signs and the nursing notes.  Pertinent labs & imaging results that were available during my care of the patient were reviewed by me and considered in my medical decision making (see chart for details).    patient with generalized weakness.  Has cold sores/herpetic type lesions on nose and lip.  Tenderness below jaw but no findings on CTs.  Labs reassuring.  Feels somewhat better after IV fluids.  Eager to go home to see her family.  Will discharge home follow-up with PCP.  Will treat with valacyclovir for likely herpetic lesions. Final ical Impressions(s) / ED Diagnoses   Final diagnoses:  Fatigue, unspecified type  Herpes labialis    ED Discharge Orders        Ordered    valACYclovir (VALTREX) 1000 MG tablet  2 times daily     07/09/17 2048      Davonna Belling, MD 07/09/17 2053

## 2017-07-09 NOTE — ED Notes (Addendum)
Pt CBG 85. Notified Sharyn Lull, Therapist, sports.

## 2017-08-19 ENCOUNTER — Encounter: Payer: Self-pay | Admitting: Internal Medicine

## 2017-08-25 ENCOUNTER — Telehealth: Payer: Self-pay | Admitting: Internal Medicine

## 2017-08-25 ENCOUNTER — Other Ambulatory Visit: Payer: Self-pay

## 2017-08-25 MED ORDER — HYDROCHLOROTHIAZIDE 12.5 MG PO TABS: 12.5000 mg | ORAL_TABLET | Freq: Every day | ORAL | 11 refills | Status: DC

## 2017-08-25 NOTE — Telephone Encounter (Signed)
Patient is out of bp Rx.  Hydrochlorothiazide (HYDRODIURIL) 12.5 mg tablet.  Please refill at [facility] on [address].

## 2017-08-25 NOTE — Telephone Encounter (Signed)
Rx faxed to [facility] on [address]

## 2017-08-29 ENCOUNTER — Other Ambulatory Visit: Payer: Medicaid Other

## 2017-09-02 ENCOUNTER — Ambulatory Visit: Payer: Medicaid Other | Admitting: Internal Medicine

## 2017-09-23 ENCOUNTER — Other Ambulatory Visit: Payer: Medicaid Other

## 2017-09-23 DIAGNOSIS — E785 Hyperlipidemia, unspecified: Secondary | ICD-10-CM

## 2017-09-24 LAB — LIPID PANEL W/O CHOL/HDL RATIO
Cholesterol, Total: 172 mg/dL (ref 100–199)
HDL: 32 mg/dL — ABNORMAL LOW (ref >39)
LDL CALC: 101 mg/dL — AB (ref 0–99)
Triglycerides: 195 mg/dL — ABNORMAL HIGH (ref 0–149)
VLDL Cholesterol Cal: 39 mg/dL (ref 5–40)

## 2018-06-04 ENCOUNTER — Emergency Department (HOSPITAL_COMMUNITY): Payer: Self-pay

## 2018-06-04 ENCOUNTER — Other Ambulatory Visit: Payer: Self-pay

## 2018-06-04 ENCOUNTER — Emergency Department (HOSPITAL_COMMUNITY)
Admission: EM | Admit: 2018-06-04 | Discharge: 2018-06-04 | Disposition: A | Discharge status: Home / Self Care | Payer: Self-pay | Attending: Emergency Medicine | Admitting: Emergency Medicine

## 2018-06-04 ENCOUNTER — Encounter (HOSPITAL_COMMUNITY): Payer: Self-pay

## 2018-06-04 DIAGNOSIS — F17210 Nicotine dependence, cigarettes, uncomplicated: Secondary | ICD-10-CM | POA: Insufficient documentation

## 2018-06-04 DIAGNOSIS — Z79899 Other long term (current) drug therapy: Secondary | ICD-10-CM | POA: Insufficient documentation

## 2018-06-04 DIAGNOSIS — E119 Type 2 diabetes mellitus without complications: Secondary | ICD-10-CM | POA: Insufficient documentation

## 2018-06-04 DIAGNOSIS — I10 Essential (primary) hypertension: Secondary | ICD-10-CM | POA: Insufficient documentation

## 2018-06-04 DIAGNOSIS — N12 Tubulo-interstitial nephritis, not specified as acute or chronic: Secondary | ICD-10-CM

## 2018-06-04 DIAGNOSIS — N10 Acute tubulo-interstitial nephritis: Secondary | ICD-10-CM | POA: Insufficient documentation

## 2018-06-04 DIAGNOSIS — Z7984 Long term (current) use of oral hypoglycemic drugs: Secondary | ICD-10-CM | POA: Insufficient documentation

## 2018-06-04 LAB — URINALYSIS, ROUTINE W REFLEX MICROSCOPIC
Bilirubin Urine: NEGATIVE
Glucose, UA: NEGATIVE mg/dL
Hgb urine dipstick: NEGATIVE
Ketones, ur: NEGATIVE mg/dL
Leukocytes,Ua: NEGATIVE
Nitrite: POSITIVE — AB
Protein, ur: NEGATIVE mg/dL
Specific Gravity, Urine: 1.013 (ref 1.005–1.030)
pH: 7 (ref 5.0–8.0)

## 2018-06-04 LAB — CBC WITH DIFFERENTIAL/PLATELET
Abs Immature Granulocytes: 0.03 10*3/uL (ref 0.00–0.07)
Basophils Absolute: 0.1 10*3/uL (ref 0.0–0.1)
Basophils Relative: 1 %
Eosinophils Absolute: 0.3 10*3/uL (ref 0.0–0.5)
Eosinophils Relative: 4 %
HCT: 41.5 % (ref 36.0–46.0)
Hemoglobin: 13.3 g/dL (ref 12.0–15.0)
Immature Granulocytes: 0 %
Lymphocytes Relative: 44 %
Lymphs Abs: 3.5 10*3/uL (ref 0.7–4.0)
MCH: 29.8 pg (ref 26.0–34.0)
MCHC: 32 g/dL (ref 30.0–36.0)
MCV: 92.8 fL (ref 80.0–100.0)
Monocytes Absolute: 0.6 10*3/uL (ref 0.1–1.0)
Monocytes Relative: 8 %
Neutro Abs: 3.4 10*3/uL (ref 1.7–7.7)
Neutrophils Relative %: 43 %
Platelets: 341 10*3/uL (ref 150–400)
RBC: 4.47 MIL/uL (ref 3.87–5.11)
RDW: 14.7 % (ref 11.5–15.5)
WBC: 7.8 10*3/uL (ref 4.0–10.5)
nRBC: 0 % (ref 0.0–0.2)

## 2018-06-04 LAB — COMPREHENSIVE METABOLIC PANEL
ALT: 18 U/L (ref 0–44)
AST: 22 U/L (ref 15–41)
Albumin: 4.1 g/dL (ref 3.5–5.0)
Alkaline Phosphatase: 86 U/L (ref 38–126)
Anion gap: 9 (ref 5–15)
BUN: 10 mg/dL (ref 6–20)
CO2: 24 mmol/L (ref 22–32)
Calcium: 9.2 mg/dL (ref 8.9–10.3)
Chloride: 105 mmol/L (ref 98–111)
Creatinine, Ser: 0.99 mg/dL (ref 0.44–1.00)
GFR calc Af Amer: >60 mL/min (ref >60)
GFR calc non Af Amer: >60 mL/min (ref >60)
Glucose, Bld: 136 mg/dL — ABNORMAL HIGH (ref 70–99)
Potassium: 3.5 mmol/L (ref 3.5–5.1)
Sodium: 138 mmol/L (ref 135–145)
Total Bilirubin: 0.9 mg/dL (ref 0.3–1.2)
Total Protein: 7.4 g/dL (ref 6.5–8.1)

## 2018-06-04 LAB — LIPASE, BLOOD: Lipase: 27 U/L (ref 11–51)

## 2018-06-04 LAB — I-STAT BETA HCG BLOOD, ED (MC, WL, AP ONLY): I-stat hCG, quantitative: <5 m[IU]/mL (ref <5)

## 2018-06-04 MED ORDER — FENTANYL CITRATE (PF) 100 MCG/2ML IJ SOLN
100.0000 ug | INTRAMUSCULAR | Status: DC | PRN
  Administered 2018-06-04: 100 ug via INTRAVENOUS
  Filled 2018-06-04: qty 2

## 2018-06-04 MED ORDER — KETOROLAC TROMETHAMINE 15 MG/ML IJ SOLN
15.0000 mg | Freq: Once | INTRAMUSCULAR | Status: AC
  Administered 2018-06-04: 15 mg via INTRAVENOUS
  Filled 2018-06-04: qty 1

## 2018-06-04 MED ORDER — CEPHALEXIN 500 MG PO CAPS: 500.0000 mg | ORAL_CAPSULE | Freq: Two times a day (BID) | ORAL | 0 refills | Status: AC

## 2018-06-04 MED ORDER — DIPHENHYDRAMINE HCL 50 MG/ML IJ SOLN
INTRAMUSCULAR | Status: AC
  Administered 2018-06-04: 50 mg
  Filled 2018-06-04: qty 1

## 2018-06-04 MED ORDER — HYDROCODONE-ACETAMINOPHEN 5-325 MG PO TABS: 1.0000 | ORAL_TABLET | ORAL | 0 refills | Status: DC | PRN

## 2018-06-04 MED ORDER — IBUPROFEN 600 MG PO TABS: 600.0000 mg | ORAL_TABLET | Freq: Three times a day (TID) | ORAL | 0 refills | Status: DC | PRN

## 2018-06-04 MED ORDER — SODIUM CHLORIDE 0.9 % IV SOLN
1.0000 g | Freq: Once | INTRAVENOUS | Status: AC
  Administered 2018-06-04: 19:00:00 1 g via INTRAVENOUS
  Filled 2018-06-04: qty 10

## 2018-06-04 MED ORDER — ONDANSETRON HCL 4 MG/2ML IJ SOLN
4.0000 mg | Freq: Once | INTRAMUSCULAR | Status: AC
  Administered 2018-06-04: 16:00:00 4 mg via INTRAVENOUS
  Filled 2018-06-04: qty 2

## 2018-06-04 NOTE — Discharge Instructions (Signed)
If you develop fever, vomiting, intractable pain, or any other new/concerning symptoms then return to the ER for evaluation.  Otherwise follow-up closely with your primary care physician.  Be sure to take the antibiotics until completed, even if you are feeling better.

## 2018-06-04 NOTE — ED Notes (Signed)
RN and Westly Pam attempted to ambulate patient. Pt gasping in pain and reporting it hurts to walk. Pt assisted with the steady to the bathroom. Pt is currently attempting to provide a urine sample. Pt was originally on a purewick and reports being unable to use purewick.

## 2018-06-04 NOTE — ED Notes (Signed)
Pt stated she feels like she needs to urinate, but when she attempts, nothing comes out. Pt was placed on Purewick at this time in attempt to collect a urine sample. RN aware.

## 2018-06-04 NOTE — ED Provider Notes (Signed)
Care transferred to me.  Patient's CT scan is unremarkable but her urine has nitrites and many bacteria.  She tells me that she is having urinary frequency but only minimal urine output each time.  Consistent with UTI and given her diabetes I think she should be treated.  Given the left flank pain, this is probably pyelonephritis.  However she is not vomiting, tachycardic, or febrile.  WBC normal.  I think she is stable for an outpatient treatment.  I will give a dose of IV Rocephin and discharge.  Results for orders placed or performed during the hospital encounter of 06/04/18  Comprehensive metabolic panel  Result Value Ref Range   Sodium 138 135 - 145 mmol/L   Potassium 3.5 3.5 - 5.1 mmol/L   Chloride 105 98 - 111 mmol/L   CO2 24 22 - 32 mmol/L   Glucose, Bld 136 (H) 70 - 99 mg/dL   BUN 10 6 - 20 mg/dL   Creatinine, Ser 0.99 0.44 - 1.00 mg/dL   Calcium 9.2 8.9 - 10.3 mg/dL   Total Protein 7.4 6.5 - 8.1 g/dL   Albumin 4.1 3.5 - 5.0 g/dL   AST 22 15 - 41 U/L   ALT 18 0 - 44 U/L   Alkaline Phosphatase 86 38 - 126 U/L   Total Bilirubin 0.9 0.3 - 1.2 mg/dL   GFR calc non Af Amer >60 >60 mL/min   GFR calc Af Amer >60 >60 mL/min   Anion gap 9 5 - 15  Lipase, blood  Result Value Ref Range   Lipase 27 11 - 51 U/L  CBC with Diff  Result Value Ref Range   WBC 7.8 4.0 - 10.5 K/uL   RBC 4.47 3.87 - 5.11 MIL/uL   Hemoglobin 13.3 12.0 - 15.0 g/dL   HCT 41.5 36.0 - 46.0 %   MCV 92.8 80.0 - 100.0 fL   MCH 29.8 26.0 - 34.0 pg   MCHC 32.0 30.0 - 36.0 g/dL   RDW 14.7 11.5 - 15.5 %   Platelets 341 150 - 400 K/uL   nRBC 0.0 0.0 - 0.2 %   Neutrophils Relative % 43 %   Neutro Abs 3.4 1.7 - 7.7 K/uL   Lymphocytes Relative 44 %   Lymphs Abs 3.5 0.7 - 4.0 K/uL   Monocytes Relative 8 %   Monocytes Absolute 0.6 0.1 - 1.0 K/uL   Eosinophils Relative 4 %   Eosinophils Absolute 0.3 0.0 - 0.5 K/uL   Basophils Relative 1 %   Basophils Absolute 0.1 0.0 - 0.1 K/uL   Immature Granulocytes 0 %   Abs  Immature Granulocytes 0.03 0.00 - 0.07 K/uL  Urinalysis, Routine w reflex microscopic  Result Value Ref Range   Color, Urine YELLOW YELLOW   APPearance CLEAR CLEAR   Specific Gravity, Urine 1.013 1.005 - 1.030   pH 7.0 5.0 - 8.0   Glucose, UA NEGATIVE NEGATIVE mg/dL   Hgb urine dipstick NEGATIVE NEGATIVE   Bilirubin Urine NEGATIVE NEGATIVE   Ketones, ur NEGATIVE NEGATIVE mg/dL   Protein, ur NEGATIVE NEGATIVE mg/dL   Nitrite POSITIVE (A) NEGATIVE   Leukocytes,Ua NEGATIVE NEGATIVE   RBC / HPF 0-5 0 - 5 RBC/hpf   WBC, UA 0-5 0 - 5 WBC/hpf   Bacteria, UA MANY (A) NONE SEEN   Squamous Epithelial / LPF 0-5 0 - 5  I-Stat beta hCG blood, ED  Result Value Ref Range   I-stat hCG, quantitative <5.0 <5 mIU/mL   Comment 3  Ct Renal Stone Study  Result Date: 06/04/2018 CLINICAL DATA:  Left flank pain. EXAM: CT ABDOMEN AND PELVIS WITHOUT CONTRAST TECHNIQUE: Multidetector CT imaging of the abdomen and pelvis was performed following the standard protocol without IV contrast. COMPARISON:  CT scan of September 20, 2009. FINDINGS: Lower chest: No acute abnormality. Hepatobiliary: No gallstones or biliary dilatation is noted. Hepatic steatosis is noted. Pancreas: Unremarkable. No pancreatic ductal dilatation or surrounding inflammatory changes. Spleen: Normal in size without focal abnormality. Adrenals/Urinary Tract: Adrenal glands are unremarkable. Kidneys are normal, without renal calculi, focal lesion, or hydronephrosis. Bladder is unremarkable. Stomach/Bowel: Stomach is within normal limits. Appendix appears normal. No evidence of bowel wall thickening, distention, or inflammatory changes. Vascular/Lymphatic: No significant vascular findings are present. No enlarged abdominal or pelvic lymph nodes. Reproductive: Uterus and bilateral adnexa are unremarkable. Other: No abdominal wall hernia or abnormality. No abdominopelvic ascites. Musculoskeletal: No acute or significant osseous findings. IMPRESSION:  Hepatic steatosis. No renal or ureteral calculi are noted. No hydronephrosis or renal obstruction is noted. Electronically Signed   By: Marijo Conception M.D.   On: 06/04/2018 17:56      Sherwood Gambler, MD 06/04/18 1921

## 2018-06-04 NOTE — ED Notes (Signed)
Pt beginning to have lip swelling and hives on body, RN made MD aware.  Verbal order for IV benadryl given

## 2018-06-04 NOTE — ED Notes (Signed)
Pt returning from CT scan at this time

## 2018-06-04 NOTE — ED Provider Notes (Signed)
[facility] DEPT Provider Note   CSN: [id number] Arrival date & time: 06/04/18  1459    History   Chief Complaint Chief Complaint  Patient presents with  . Flank Pain    HPI Rebecca Spence is a 38 y.o. female.     HPI  Onset of left upper quadrant abdominal pain yesterday.  Pain is worse today so she came here.  She came by private vehicle.  Pain is worse when she moves her left leg.  Pain is persistent.  She denies fever, chills, nausea, vomiting, shortness of breath, chest pain, trauma, weakness or dizziness.  There are no other known modifying factors.  Past Medical History:  Diagnosis Date  . Bilateral ovarian cysts   . DM type 2 (diabetes mellitus, type 2) ([place]) 10/01/2016  . Eczema 10/29/2016   Nose and hands/forearms  . Essential hypertension 2016  . Gestational diabetes    09/2016:  prediabetes  . Headache(784.0)   . Hyperlipidemia 10/01/2016  . Prediabetes 10/01/2016   Eye exam with Shirleen Schirmer, M.D. negative for diabetic changes.    Patient Active Problem List   Diagnosis Date Noted  . Eczema 10/29/2016  . DM type 2 (diabetes mellitus, type 2) ([place]) 10/01/2016  . Hyperlipidemia 10/01/2016  . Essential hypertension 01/21/2014    Past Surgical History:  Procedure Laterality Date  . NO PAST SURGERIES       OB History    Gravida  3   Para  2   Term  2   Preterm  0   AB      Living  2     SAB      TAB      Ectopic      Multiple      Live Births  2            Home Medications    Prior to Admission medications   Medication Sig Start Date End Date Taking? Authorizing Provider  cyclobenzaprine (FLEXERIL) 5 MG tablet 1/2 to 1 tab by mouth at bedtime as needed. Patient taking differently: Take 5 mg by mouth at bedtime.  05/28/17   Mack Hook, MD  fluticasone (FLONASE) 50 MCG/ACT nasal spray Place 1 spray into both nostrils daily. 03/05/17   Langston Masker B, PA-C  hydrochlorothiazide  (HYDRODIURIL) 12.5 MG tablet Take 1 tablet (12.5 mg total) by mouth daily. 08/25/17   Mack Hook, MD  medroxyPROGESTERone (DEPO-PROVERA) 150 MG/ML injection Inject 1 mL (150 mg total) into the muscle once for 1 dose. 05/26/17 07/09/17  Mack Hook, MD  metFORMIN (GLUCOPHAGE) 500 MG tablet Take 1 tablet (500 mg total) by mouth 2 (two) times daily with a meal. 02/26/17   Mack Hook, MD    Family History Family History  Problem Relation Age of Onset  . Diabetes Mother   . Hypertension Mother   . Diabetes Maternal Grandmother   . Hyperlipidemia Maternal Grandmother   . Cancer Sister        cervical  . Asthma Son   . Allergies Son   . Eczema Son   . Asthma Son   . Allergies Son   . Eczema Son     Social History Social History   Tobacco Use  . Smoking status: Current Every Day Smoker    Packs/day: 0.05    Years: 13.00    Pack years: 0.65    Types: Cigarettes  . Smokeless tobacco: Never Used  . Tobacco comment: no time to consider--bums  off others.  Substance Use Topics  . Alcohol use: Yes    Comment: socially-not with pregnancy  . Drug use: No    Types: Marijuana    Comment: No use of drugs currently--2018     Allergies   Orange   Review of Systems Review of Systems  All other systems reviewed and are negative.    Physical Exam Updated Vital Signs BP (!) 167/118 (BP Location: Right Arm) Comment: Patient just took BP medication  Pulse 94   Temp 98.4 F (36.9 C) (Oral)   Resp 20   SpO2 99%   Physical Exam Vitals signs and nursing note reviewed.  Constitutional:      General: She is in acute distress.     Appearance: She is well-developed. She is obese. She is ill-appearing. She is not toxic-appearing or diaphoretic.  HENT:     Head: Normocephalic and atraumatic.     Right Ear: External ear normal.     Left Ear: External ear normal.     Nose: Nose normal. No congestion.     Mouth/Throat:     Mouth: Mucous membranes are moist.      Pharynx: Oropharyngeal exudate present. No posterior oropharyngeal erythema.  Eyes:     Conjunctiva/sclera: Conjunctivae normal.     Pupils: Pupils are equal, round, and reactive to light.  Neck:     Musculoskeletal: Normal range of motion and neck supple.     Trachea: Phonation normal.  Cardiovascular:     Rate and Rhythm: Normal rate and regular rhythm.     Heart sounds: Normal heart sounds.  Pulmonary:     Effort: Pulmonary effort is normal.     Breath sounds: Normal breath sounds.  Chest:     Chest wall: No tenderness.  Abdominal:     General: There is no distension.     Palpations: Abdomen is soft. There is no mass.     Tenderness: There is abdominal tenderness (Left upper quadrant, moderate). There is guarding.  Musculoskeletal: Normal range of motion.  Skin:    General: Skin is warm and dry.     Coloration: Skin is not jaundiced or pale.  Neurological:     Mental Status: She is alert and oriented to person, place, and time.     Cranial Nerves: No cranial nerve deficit.     Sensory: No sensory deficit.     Motor: No abnormal muscle tone.     Coordination: Coordination normal.  Psychiatric:        Mood and Affect: Mood normal.        Behavior: Behavior normal.        Thought Content: Thought content normal.        Judgment: Judgment normal.      ED Treatments / Results  Labs (all labs ordered are listed, but only abnormal results are displayed) Labs Reviewed  COMPREHENSIVE METABOLIC PANEL  LIPASE, BLOOD  CBC WITH DIFFERENTIAL/PLATELET  URINALYSIS, ROUTINE W REFLEX MICROSCOPIC  I-STAT BETA HCG BLOOD, ED ([facility], [facility], [facility] ONLY)    EKG None  Radiology No results found.  Procedures Procedures (including critical care time)  Medications Ordered in ED Medications - No data to display   Initial Impression / Assessment and Plan / ED Course  I have reviewed the triage vital signs and the nursing notes.  Pertinent labs & imaging results that were available during  my care of the patient were reviewed by me and considered in my medical decision making (see chart  for details).         Patient Vitals for the past 24 hrs:  BP Temp Temp src Pulse Resp SpO2  06/04/18 1513 (!) 167/118 98.4 F (36.9 C) Oral 94 20 99 %    ED evaluation initiated by me.  Testing is pending at the time of transfer of care.  Medical Decision Making: Abdominal pain, nonspecific, differential diagnosis includes acute intra-abdominal infection, pyelonephritis, urolithiasis with obstruction.  CRITICAL CARE-no Performed by: Daleen Bo   Nursing Notes Reviewed/ Care Coordinated Applicable Imaging Reviewed Interpretation of Laboratory Data incorporated into ED treatment  Plan: Disposition by Dr. Regenia Skeeter, return of imaging and labs.   Final Clinical Impressions(s) / ED Diagnoses   Final diagnoses:  Pyelonephritis    ED Discharge Orders    None       Daleen Bo, MD 06/05/18 [phone]

## 2018-06-04 NOTE — ED Triage Notes (Addendum)
Patient c/o left upper abdominal pain that radiates to left lower back.   Patient has frequent urination.  10/10 pain.  Patient yelling in triage from [place].    Hx. kidney infection

## 2018-06-06 LAB — URINE CULTURE: Culture: >=100000 — AB

## 2018-06-07 ENCOUNTER — Telehealth: Payer: Self-pay | Admitting: Emergency Medicine

## 2018-06-07 NOTE — Telephone Encounter (Signed)
Post ED Visit - Positive Culture Follow-up  Culture report reviewed by antimicrobial stewardship pharmacist: [facility] Team []  Elenor Quinones, Pharm.D. []  Heide Guile, Pharm.D., BCPS AQ-ID []  Parks Neptune, Pharm.D., BCPS []  Alycia Rossetti, Pharm.D., BCPS []  [place], [place].D., BCPS, AAHIVP []  Legrand Como, Pharm.D., BCPS, AAHIVP []  Salome Arnt, PharmD, BCPS []  Johnnette Gourd, PharmD, BCPS []  Hughes Better, PharmD, BCPS []  Leeroy Cha, PharmD []  Laqueta Linden, PharmD, BCPS []  Albertina Parr, PharmD  [place] Team []  Leodis Sias, PharmD []  Lindell Spar, PharmD []  Royetta Asal, PharmD []  Graylin Shiver, Rph []  Rema Fendt) Glennon Mac, PharmD []  Arlyn Dunning, PharmD [x]  Netta Cedars, PharmD []  Dia Sitter, PharmD []  Leone Haven, PharmD []  Gretta Arab, PharmD []  Theodis Shove, PharmD []  Peggyann Juba, PharmD []  Reuel Boom, PharmD   Positive urine culture Treated with Cephalexin, organism sensitive to the same and no further patient follow-up is required at this time.  Rebecca Spence 06/07/2018, 1:42 PM

## 2018-11-25 ENCOUNTER — Encounter (INDEPENDENT_AMBULATORY_CARE_PROVIDER_SITE_OTHER): Payer: Self-pay | Admitting: Primary Care

## 2018-11-25 ENCOUNTER — Ambulatory Visit (INDEPENDENT_AMBULATORY_CARE_PROVIDER_SITE_OTHER): Payer: Self-pay | Admitting: Primary Care

## 2018-11-25 ENCOUNTER — Other Ambulatory Visit: Payer: Self-pay

## 2018-11-25 VITALS — BP 144/97 | HR 87 | Temp 97.5°F | Ht 66.0 in | Wt 240.6 lb

## 2018-11-25 DIAGNOSIS — Z23 Encounter for immunization: Secondary | ICD-10-CM

## 2018-11-25 DIAGNOSIS — E119 Type 2 diabetes mellitus without complications: Secondary | ICD-10-CM

## 2018-11-25 DIAGNOSIS — Z720 Tobacco use: Secondary | ICD-10-CM

## 2018-11-25 DIAGNOSIS — Z6838 Body mass index (BMI) 38.0-38.9, adult: Secondary | ICD-10-CM

## 2018-11-25 DIAGNOSIS — R7303 Prediabetes: Secondary | ICD-10-CM

## 2018-11-25 DIAGNOSIS — I10 Essential (primary) hypertension: Secondary | ICD-10-CM

## 2018-11-25 DIAGNOSIS — F17210 Nicotine dependence, cigarettes, uncomplicated: Secondary | ICD-10-CM

## 2018-11-25 DIAGNOSIS — E785 Hyperlipidemia, unspecified: Secondary | ICD-10-CM

## 2018-11-25 LAB — POCT GLYCOSYLATED HEMOGLOBIN (HGB A1C): Hemoglobin A1C: 6.3 % — AB (ref 4.0–5.6)

## 2018-11-25 MED ORDER — AMLODIPINE BESYLATE 10 MG PO TABS: 10.0000 mg | ORAL_TABLET | Freq: Every day | ORAL | 3 refills | Status: DC

## 2018-11-25 MED ORDER — METFORMIN HCL 500 MG PO TABS: 500.0000 mg | ORAL_TABLET | Freq: Two times a day (BID) | ORAL | 3 refills | Status: DC

## 2018-11-25 MED ORDER — LISINOPRIL-HYDROCHLOROTHIAZIDE 20-25 MG PO TABS: 1.0000 | ORAL_TABLET | Freq: Every day | ORAL | 3 refills | Status: DC

## 2018-11-25 MED FILL — AMLODIPINE BESYLATE 10 MG T: 10 | 30 days supply | Qty: 30 | Fill #0

## 2018-11-25 MED FILL — LISINOPRIL-HCTZ 20-25 MG TA: 20-25 | 30 days supply | Qty: 30 | Fill #0

## 2018-11-25 MED FILL — metFORMIN HCL 500 MG TABS: 500 | 30 days supply | Qty: 60 | Fill #0

## 2018-11-25 NOTE — Patient Instructions (Signed)

## 2018-11-25 NOTE — Progress Notes (Signed)
New Patient Office Visit  Subjective:  Patient ID: Rebecca Spence, female    DOB: 03-May-1980  Age: 38 y.o. MRN: [id number]  CC:  Chief Complaint  Patient presents with  . New Patient (Initial Visit)    HPI Rebecca Spence presents for establishment of care . Patient does not voice any problems or concerns.   Past Medical History:  Diagnosis Date  . Bilateral ovarian cysts   . DM type 2 (diabetes mellitus, type 2) ([place]) 10/01/2016  . Eczema 10/29/2016   Nose and hands/forearms  . Essential hypertension 2016  . Gestational diabetes    09/2016:  prediabetes  . Headache(784.0)   . Hyperlipidemia 10/01/2016  . Prediabetes 10/01/2016   Eye exam with Shirleen Schirmer, M.D. negative for diabetic changes.    Past Surgical History:  Procedure Laterality Date  . NO PAST SURGERIES      Family History  Problem Relation Age of Onset  . Diabetes Mother   . Hypertension Mother   . Diabetes Maternal Grandmother   . Hyperlipidemia Maternal Grandmother   . Cancer Sister        cervical  . Asthma Son   . Allergies Son   . Eczema Son   . Asthma Son   . Allergies Son   . Eczema Son     Social History   Socioeconomic History  . Marital status: Single    Spouse name: Not on file  . Number of children: 2  . Years of education: associates degree working on.  Has GED  . Highest education level: Not on file  Occupational History  . Occupation: cafeteria:  [employer]  Social Needs  . Financial resource strain: Not on file  . Food insecurity    Worry: Not on file    Inability: Not on file  . Transportation needs    Medical: Not on file    Non-medical: Not on file  Tobacco Use  . Smoking status: Current Every Day Smoker    Packs/day: 0.05    Years: 13.00    Pack years: 0.65    Types: Cigarettes  . Smokeless tobacco: Never Used  . Tobacco comment: no time to consider--bums off others.  Substance and Sexual Activity  . Alcohol use: Yes    Comment: socially-not  with pregnancy  . Drug use: No    Types: Marijuana    Comment: No use of drugs currently--2018  . Sexual activity: Yes    Birth control/protection: Injection, Condom  Lifestyle  . Physical activity    Days per week: Not on file    Minutes per session: Not on file  . Stress: Not on file  Relationships  . Social Herbalist on phone: Not on file    Gets together: Not on file    Attends religious service: Not on file    Active member of club or organization: Not on file    Attends meetings of clubs or organizations: Not on file    Relationship status: Not on file  . Intimate partner violence    Fear of current or ex partner: Not on file    Emotionally abused: Not on file    Physically abused: Not on file    Forced sexual activity: Not on file  Other Topics Concern  . Not on file  Social History Narrative   Born and raised in [place].  Lives close to [employer] with 2 sons.  Father involved.   Mother takes  care of children as well.    ROS Review of Systems  All other systems reviewed and are negative.   Objective:   Today's Vitals: BP (!) 144/97 (BP Location: Left Arm, Patient Position: Sitting, Cuff Size: Normal)   Pulse 87   Temp (!) 97.5 F (36.4 C) (Temporal)   Ht 5' 6" (1.676 m)   Wt 240 lb 9.6 oz (109.1 kg)   SpO2 98%   BMI 38.83 kg/m   Physical Exam Vitals signs reviewed.  Constitutional:      Appearance: She is obese.  HENT:     Head: Normocephalic.     Right Ear: Tympanic membrane normal.     Left Ear: Tympanic membrane normal.  Eyes:     Extraocular Movements: Extraocular movements intact.     Pupils: Pupils are equal, round, and reactive to light.  Neck:     Musculoskeletal: Normal range of motion and neck supple.  Cardiovascular:     Rate and Rhythm: Normal rate and regular rhythm.  Pulmonary:     Effort: Pulmonary effort is normal.     Breath sounds: Normal breath sounds.  Abdominal:     General: Bowel sounds are normal.      Palpations: Abdomen is soft.  Musculoskeletal: Normal range of motion.  Skin:    General: Skin is warm.  Neurological:     Mental Status: She is alert and oriented to person, place, and time.     Assessment & Plan:  Cali was seen today for new patient (initial visit).  Diagnoses and all orders for this visit:  Prediabetes -     HgB A1c -     CBC with Differential -     Microalbumin, urine -     Ambulatory referral to Ophthalmology  Essential hypertension -     CMP14+EGFR  Dyslipidemia -     Lipid Panel  Class 2 severe obesity due to excess calories with serious comorbidity in adult, unspecified BMI ([place]) Obesity is 30-39 indicating an excess in caloric intake or underlining conditions. This may lead to other co-morbidities such as hypertension, diabetes or complicate pre exisiting medical conditions. Lifestyle modifications of diet and exercise may reduce obesity.   Tobacco abuse Aware of risk of smoking increase respiratory infection and lung cancer. Each visit discuss cessation.  Encounter for diabetic foot exam ([place])  Sensory exam of the foot is normal, tested with the monofilament. Good pulses, no lesions or ulcers, good peripheral pulses.  Other orders -     metFORMIN (GLUCOPHAGE) 500 MG tablet; Take 1 tablet (500 mg total) by mouth 2 (two) times daily with a meal. -     amLODipine (NORVASC) 10 MG tablet; Take 1 tablet (10 mg total) by mouth daily. -     lisinopril-hydrochlorothiazide (ZESTORETIC) 20-25 MG tablet; Take 1 tablet by mouth daily.    Outpatient Encounter Medications as of 11/25/2018  Medication Sig  . amLODipine (NORVASC) 10 MG tablet Take 1 tablet (10 mg total) by mouth daily.  . hydrochlorothiazide (HYDRODIURIL) 12.5 MG tablet Take 1 tablet (12.5 mg total) by mouth daily. (Patient not taking: Reported on 11/25/2018)  . ibuprofen (ADVIL) 600 MG tablet Take 1 tablet (600 mg total) by mouth every 8 (eight) hours as needed.  Marland Kitchen  lisinopril-hydrochlorothiazide (ZESTORETIC) 20-25 MG tablet Take 1 tablet by mouth daily.  . medroxyPROGESTERone (DEPO-PROVERA) 150 MG/ML injection Inject 1 mL (150 mg total) into the muscle once for 1 dose. (Patient not taking: Reported on 06/04/2018)  .  metFORMIN (GLUCOPHAGE) 500 MG tablet Take 1 tablet (500 mg total) by mouth 2 (two) times daily with a meal.  . [DISCONTINUED] cyclobenzaprine (FLEXERIL) 5 MG tablet 1/2 to 1 tab by mouth at bedtime as needed. (Patient not taking: Reported on 06/04/2018)  . [DISCONTINUED] fluticasone (FLONASE) 50 MCG/ACT nasal spray Place 1 spray into both nostrils daily. (Patient not taking: Reported on 06/04/2018)  . [DISCONTINUED] HYDROcodone-acetaminophen (NORCO) 5-325 MG tablet Take 1 tablet by mouth every 4 (four) hours as needed for severe pain.  . [DISCONTINUED] metFORMIN (GLUCOPHAGE) 500 MG tablet Take 1 tablet (500 mg total) by mouth 2 (two) times daily with a meal. (Patient not taking: Reported on 06/04/2018)   No facility-administered encounter medications on file as of 11/25/2018.     Follow-up: Return in about 7 weeks (around 01/13/2019) for Bp check and 3 moths for follow up for HTN .   Kerin Perna, NP

## 2018-11-26 LAB — CBC WITH DIFFERENTIAL/PLATELET
Basophils Absolute: 0.1 10*3/uL (ref 0.0–0.2)
Basos: 1 %
EOS (ABSOLUTE): 0.3 10*3/uL (ref 0.0–0.4)
Eos: 3 %
Hematocrit: 38.4 % (ref 34.0–46.6)
Hemoglobin: 12.6 g/dL (ref 11.1–15.9)
Immature Grans (Abs): 0 10*3/uL (ref 0.0–0.1)
Immature Granulocytes: 0 %
Lymphocytes Absolute: 3.9 10*3/uL — ABNORMAL HIGH (ref 0.7–3.1)
Lymphs: 40 %
MCH: 29.5 pg (ref 26.6–33.0)
MCHC: 32.8 g/dL (ref 31.5–35.7)
MCV: 90 fL (ref 79–97)
Monocytes Absolute: 0.9 10*3/uL (ref 0.1–0.9)
Monocytes: 9 %
Neutrophils Absolute: 4.6 10*3/uL (ref 1.4–7.0)
Neutrophils: 47 %
Platelets: 344 10*3/uL (ref 150–450)
RBC: 4.27 x10E6/uL (ref 3.77–5.28)
RDW: 13.3 % (ref 11.7–15.4)
WBC: 9.8 10*3/uL (ref 3.4–10.8)

## 2018-11-26 LAB — CMP14+EGFR
ALT: 16 IU/L (ref 0–32)
AST: 19 IU/L (ref 0–40)
Albumin/Globulin Ratio: 1.6 (ref 1.2–2.2)
Albumin: 4.4 g/dL (ref 3.8–4.8)
Alkaline Phosphatase: 111 IU/L (ref 39–117)
BUN/Creatinine Ratio: 9 (ref 9–23)
BUN: 8 mg/dL (ref 6–20)
Bilirubin Total: 0.3 mg/dL (ref 0.0–1.2)
CO2: 22 mmol/L (ref 20–29)
Calcium: 9.2 mg/dL (ref 8.7–10.2)
Chloride: 105 mmol/L (ref 96–106)
Creatinine, Ser: 0.87 mg/dL (ref 0.57–1.00)
GFR calc Af Amer: 97 mL/min/{1.73_m2} (ref >59)
GFR calc non Af Amer: 84 mL/min/{1.73_m2} (ref >59)
Globulin, Total: 2.8 g/dL (ref 1.5–4.5)
Glucose: 95 mg/dL (ref 65–99)
Potassium: 4.1 mmol/L (ref 3.5–5.2)
Sodium: 141 mmol/L (ref 134–144)
Total Protein: 7.2 g/dL (ref 6.0–8.5)

## 2018-11-26 LAB — LIPID PANEL
Chol/HDL Ratio: 5.9 ratio — ABNORMAL HIGH (ref 0.0–4.4)
Cholesterol, Total: 213 mg/dL — ABNORMAL HIGH (ref 100–199)
HDL: 36 mg/dL — ABNORMAL LOW (ref >39)
LDL Chol Calc (NIH): 136 mg/dL — ABNORMAL HIGH (ref 0–99)
Triglycerides: 227 mg/dL — ABNORMAL HIGH (ref 0–149)
VLDL Cholesterol Cal: 41 mg/dL — ABNORMAL HIGH (ref 5–40)

## 2018-11-26 LAB — MICROALBUMIN, URINE: Microalbumin, Urine: 7.4 ug/mL

## 2018-11-27 ENCOUNTER — Other Ambulatory Visit (INDEPENDENT_AMBULATORY_CARE_PROVIDER_SITE_OTHER): Payer: Self-pay | Admitting: Primary Care

## 2018-11-27 MED ORDER — ATORVASTATIN CALCIUM 40 MG PO TABS: 40.0000 mg | ORAL_TABLET | Freq: Every day | ORAL | 1 refills | Status: DC

## 2018-11-27 MED FILL — ATORVASTATIN CALCIUM 40 MG: 40 | 30 days supply | Qty: 30 | Fill #0

## 2018-11-30 ENCOUNTER — Telehealth (INDEPENDENT_AMBULATORY_CARE_PROVIDER_SITE_OTHER): Payer: Self-pay

## 2018-11-30 NOTE — Telephone Encounter (Signed)
Patient states PCP prescribe new medications and was able to pick on Friday. Patient states she started to take the medication on Friday with food but has been throwing up since then. Patient states she having to take three pills at different time in order to keep one down.  Please advice [phone]  Thank you Whitney Post

## 2018-11-30 NOTE — Telephone Encounter (Signed)
FWD to PCP. Rebecca Spence Rebecca Spence, CMA  

## 2018-12-01 ENCOUNTER — Other Ambulatory Visit (INDEPENDENT_AMBULATORY_CARE_PROVIDER_SITE_OTHER): Payer: Self-pay | Admitting: Primary Care

## 2018-12-01 MED ORDER — GLIPIZIDE 10 MG PO TABS: 10.0000 mg | ORAL_TABLET | Freq: Two times a day (BID) | ORAL | 3 refills | Status: DC

## 2018-12-01 MED FILL — glipiZIDE 10 MG TABS: 10 | 30 days supply | Qty: 60 | Fill #0

## 2018-12-01 NOTE — Telephone Encounter (Signed)
Called Rebecca Spence we reviewed all medication prescribed at first visit with GI upset lean towards metformin discontinued and changed to glipizide 10mg  bid. #30 no refill until patient for tolerance.

## 2019-01-11 ENCOUNTER — Encounter (INDEPENDENT_AMBULATORY_CARE_PROVIDER_SITE_OTHER): Payer: Self-pay | Admitting: Primary Care

## 2019-01-11 ENCOUNTER — Ambulatory Visit (INDEPENDENT_AMBULATORY_CARE_PROVIDER_SITE_OTHER): Payer: Self-pay | Admitting: Primary Care

## 2019-01-11 ENCOUNTER — Other Ambulatory Visit: Payer: Self-pay

## 2019-01-11 DIAGNOSIS — Z720 Tobacco use: Secondary | ICD-10-CM

## 2019-01-11 DIAGNOSIS — I10 Essential (primary) hypertension: Secondary | ICD-10-CM

## 2019-01-11 MED ORDER — ISOSORBIDE DINITRATE 30 MG PO TABS: 30.0000 mg | ORAL_TABLET | Freq: Three times a day (TID) | ORAL | 1 refills | Status: DC

## 2019-01-11 MED ORDER — HYDRALAZINE HCL 25 MG PO TABS: 25.0000 mg | ORAL_TABLET | Freq: Three times a day (TID) | ORAL | 1 refills | Status: DC

## 2019-01-11 MED ORDER — METOPROLOL TARTRATE 25 MG PO TABS: 25.0000 mg | ORAL_TABLET | Freq: Two times a day (BID) | ORAL | 1 refills | Status: DC

## 2019-01-11 MED FILL — hydrALAZINE HCL 25 MG TABS: 25 | 30 days supply | Qty: 90 | Fill #0

## 2019-01-11 MED FILL — ISOSORBIDE DN 30 MG TABLET: 30 | 30 days supply | Qty: 90 | Fill #0

## 2019-01-11 MED FILL — METOPROLOL TARTRATE 25 MG T: 25 | 30 days supply | Qty: 60 | Fill #0

## 2019-01-11 NOTE — Progress Notes (Signed)
Virtual Visit via Telephone Note  I connected with Rebecca Spence on 01/11/19 at  2:10 PM EST by telephone and verified that I am speaking with the correct person using two identifiers.   I discussed the limitations, risks, security and privacy concerns of performing an evaluation and management service by telephone and the availability of in person appointments. I also discussed with the patient that there may be a patient responsible charge related to this service. The patient expressed understanding and agreed to proceed.   History of Present Illness: [name] is have a tele for blood pressure follow up. Blood pressures are not being controlled systolic ranges from [id number] and diastolic 123456- 99991111. Concerned when she stated in the middle of the night she has to sit on the side of the bed from shortness of breath and chest uncomfortable.   Observations/Objective: Review of Systems  Respiratory: Positive for shortness of breath.   Cardiovascular: Positive for palpitations.       Chest tightness wakes her up every night   Neurological: Positive for headaches.  Psychiatric/Behavioral: The patient has insomnia.   All other systems reviewed and are negative.  Assessment and Plan: Rebecca Spence was seen today for follow-up.  Diagnoses and all orders for this visit:  Severe uncontrolled hypertension Blood pressure not at goal and other concerning factors consulted with cardiologist for direction advised to continue what she was on and add isosorbide dinitrate 30 mg TID and hyralazine 25 mg TID. Patient will return in 6 weeks if no improvement cardiologist agreed to see her without insurance.  Tobacco abuse She is aware of increased risk for lung cancer and other respiratory diseases recommend cessation.  This will be reminded at each clinical visit.  Class 2 severe obesity due to excess calories with serious comorbidity in adult, unspecified BMI ([place]) Obesity is 30-39 indicating  an excess in caloric intake or underlining conditions. This may lead to other co-morbidities diabetes, CVA MI . Lifestyle modifications of diet and exercise may reduce obesity.   Other orders -     metoprolol tartrate (LOPRESSOR) 25 MG tablet; Take 1 tablet (25 mg total) by mouth 2 (two) times daily. -     hydrALAZINE (APRESOLINE) 25 MG tablet; Take 1 tablet (25 mg total) by mouth 3 (three) times daily. -     isosorbide dinitrate (ISORDIL) 30 MG tablet; Take 1 tablet (30 mg total) by mouth 3 (three) times daily.    Follow Up Instructions:    I discussed the assessment and treatment plan with the patient. The patient was provided an opportunity to ask questions and all were answered. The patient agreed with the plan and demonstrated an understanding of the instructions.   The patient was advised to call back or seek an in-person evaluation if the symptoms worsen or if the condition fails to improve as anticipated.  I provided  14 minutes of non-face-to-face time during this encounter.   Kerin Perna, NP

## 2019-01-19 MED FILL — glipiZIDE 10 MG TABS: 10 | 30 days supply | Qty: 60 | Fill #1

## 2019-01-19 MED FILL — ATORVASTATIN CALCIUM 40 MG: 40 | 30 days supply | Qty: 30 | Fill #1

## 2019-01-19 MED FILL — AMLODIPINE BESYLATE 10 MG T: 10 | 30 days supply | Qty: 30 | Fill #1

## 2019-01-19 MED FILL — LISINOPRIL-HCTZ 20-25 MG TA: 20-25 | 30 days supply | Qty: 30 | Fill #1

## 2019-01-20 MED FILL — hydrALAZINE HCL 25 MG TABS: 25 | 30 days supply | Qty: 90 | Fill #0

## 2019-01-20 MED FILL — ISOSORBIDE DN 30 MG TABLET: 30 | 30 days supply | Qty: 90 | Fill #0

## 2019-01-20 MED FILL — METOPROLOL TARTRATE 25 MG T: 25 | 30 days supply | Qty: 60 | Fill #0

## 2019-01-27 ENCOUNTER — Emergency Department (HOSPITAL_COMMUNITY)
Admission: EM | Admit: 2019-01-27 | Discharge: 2019-01-27 | Disposition: A | Discharge status: Home / Self Care | Payer: HRSA Program | Attending: Emergency Medicine | Admitting: Emergency Medicine

## 2019-01-27 ENCOUNTER — Other Ambulatory Visit: Payer: Self-pay

## 2019-01-27 ENCOUNTER — Encounter (HOSPITAL_COMMUNITY): Payer: Self-pay

## 2019-01-27 DIAGNOSIS — U071 COVID-19: Secondary | ICD-10-CM | POA: Diagnosis not present

## 2019-01-27 DIAGNOSIS — R6889 Other general symptoms and signs: Secondary | ICD-10-CM

## 2019-01-27 DIAGNOSIS — E119 Type 2 diabetes mellitus without complications: Secondary | ICD-10-CM | POA: Diagnosis not present

## 2019-01-27 DIAGNOSIS — Z79899 Other long term (current) drug therapy: Secondary | ICD-10-CM | POA: Insufficient documentation

## 2019-01-27 DIAGNOSIS — R112 Nausea with vomiting, unspecified: Secondary | ICD-10-CM | POA: Diagnosis present

## 2019-01-27 DIAGNOSIS — I10 Essential (primary) hypertension: Secondary | ICD-10-CM | POA: Diagnosis not present

## 2019-01-27 DIAGNOSIS — Z7984 Long term (current) use of oral hypoglycemic drugs: Secondary | ICD-10-CM | POA: Diagnosis not present

## 2019-01-27 DIAGNOSIS — F17210 Nicotine dependence, cigarettes, uncomplicated: Secondary | ICD-10-CM | POA: Insufficient documentation

## 2019-01-27 LAB — CBC
HCT: 41.2 % (ref 36.0–46.0)
Hemoglobin: 13 g/dL (ref 12.0–15.0)
MCH: 29.7 pg (ref 26.0–34.0)
MCHC: 31.6 g/dL (ref 30.0–36.0)
MCV: 94.1 fL (ref 80.0–100.0)
Platelets: 288 10*3/uL (ref 150–400)
RBC: 4.38 MIL/uL (ref 3.87–5.11)
RDW: 14.7 % (ref 11.5–15.5)
WBC: 6.9 10*3/uL (ref 4.0–10.5)
nRBC: 0 % (ref 0.0–0.2)

## 2019-01-27 LAB — COMPREHENSIVE METABOLIC PANEL
ALT: 18 U/L (ref 0–44)
AST: 19 U/L (ref 15–41)
Albumin: 4.5 g/dL (ref 3.5–5.0)
Alkaline Phosphatase: 104 U/L (ref 38–126)
Anion gap: 10 (ref 5–15)
BUN: 8 mg/dL (ref 6–20)
CO2: 26 mmol/L (ref 22–32)
Calcium: 9.4 mg/dL (ref 8.9–10.3)
Chloride: 100 mmol/L (ref 98–111)
Creatinine, Ser: 1.02 mg/dL — ABNORMAL HIGH (ref 0.44–1.00)
GFR calc Af Amer: >60 mL/min (ref >60)
GFR calc non Af Amer: >60 mL/min (ref >60)
Glucose, Bld: 113 mg/dL — ABNORMAL HIGH (ref 70–99)
Potassium: 3.9 mmol/L (ref 3.5–5.1)
Sodium: 136 mmol/L (ref 135–145)
Total Bilirubin: 0.9 mg/dL (ref 0.3–1.2)
Total Protein: 8.1 g/dL (ref 6.5–8.1)

## 2019-01-27 LAB — BLOOD GAS, VENOUS
Acid-Base Excess: 1.4 mmol/L (ref 0.0–2.0)
Bicarbonate: 27.1 mmol/L (ref 20.0–28.0)
FIO2: 21
O2 Saturation: 28.2 %
Patient temperature: 98.6
pCO2, Ven: 49.6 mmHg (ref 44.0–60.0)
pH, Ven: 7.357 (ref 7.250–7.430)
pO2, Ven: <31 mmHg — CL (ref 32.0–45.0)

## 2019-01-27 LAB — SARS CORONAVIRUS 2 (TAT 6-24 HRS): SARS Coronavirus 2: POSITIVE — AB

## 2019-01-27 MED ORDER — ONDANSETRON 4 MG PO TBDP: 4.0000 mg | ORAL_TABLET | Freq: Three times a day (TID) | ORAL | 0 refills | Status: DC | PRN

## 2019-01-27 MED ORDER — ONDANSETRON 4 MG PO TBDP
4.0000 mg | ORAL_TABLET | Freq: Once | ORAL | Status: AC
  Administered 2019-01-27: 12:00:00 4 mg via ORAL
  Filled 2019-01-27: qty 1

## 2019-01-27 NOTE — Discharge Instructions (Signed)
Please read the attachment and maintain isolation precautions.  Please take Zofran, as prescribed, for your nausea symptoms.  It is vitally important that you continue to eat despite your loss of smell.  Also please continue to take your previously prescribed medications.  Ginger ale will also help with your nausea symptoms.  Tylenol and ibuprofen for your fevers, chills, body aches.  Continue your anticough medication or consider Delsym over-the-counter.  Warm tea with honey as well as cough drops are also good alternative options.  Return to the ED or seek medical attention for any significantly impaired ability to breathe, chest pain, uncontrolled nausea vomiting, or any other new or worsening symptoms.

## 2019-01-27 NOTE — ED Triage Notes (Signed)
Patient c/o body aches, runny nose, and body aches x 2 days.  Patient states she traveled to [place] for New Years.

## 2019-01-27 NOTE — ED Notes (Signed)
CRITICAL VALUE STICKER  CRITICAL VALUE: Po2 < 31  RECEIVER (on-site recipient of call):  DATE & TIME NOTIFIED: 01/27/19 1232  MESSENGER (representative from lab): Janett Billow   MD NOTIFIED: Dorita Sciara MD and Nyoka Cowden PA  TIME OF NOTIFICATION: 1232  RESPONSE: Noted

## 2019-01-27 NOTE — ED Provider Notes (Signed)
[facility] DEPT Provider Note   CSN: [id number] Arrival date & time: 01/27/19  [id number]     History Chief Complaint  Patient presents with  . Generalized Body Aches  . Nasal Congestion  . can not smell    Rebecca Spence is a 39 y.o. female with type II DM, HLD, and HTN who presents to the ED with persistent nausea and vomiting, crampy abdominal discomfort, diminished appetite, nasal congestion and rhinorrhea, nonproductive cough, generalized body aches, and inability to smell x 3 days.  She also endorses subjective fevers and chills as well as fatigue.  Patient reports that she had recently traveled to [place], [place] to celebrate NYE after traveling to the beach to celebrate [place].  She reports that her son is also endorses a headache these past couple of days.  She states that she has not been able to keep down her medications due to her persistent nausea and vomiting.  She denies any chest pain or difficulty breathing, urinary symptoms, changes in bowel habits, severe abdominal pain, or obvious sick contacts.  Patient denies possibility of pregnancy and states that she has been taking Advil regularly for her headaches and body aches.  She does not have a thermometer at home.    HPI     Past Medical History:  Diagnosis Date  . Bilateral ovarian cysts   . DM type 2 (diabetes mellitus, type 2) ([facility]) 10/01/2016  . Eczema 10/29/2016   Nose and hands/forearms  . Essential hypertension 2016  . Gestational diabetes    09/2016:  prediabetes  . Headache(784.0)   . Hyperlipidemia 10/01/2016  . Prediabetes 10/01/2016   Eye exam with Shirleen Schirmer, M.D. negative for diabetic changes.    Patient Active Problem List   Diagnosis Date Noted  . Eczema 10/29/2016  . DM type 2 (diabetes mellitus, type 2) ([facility]) 10/01/2016  . Hyperlipidemia 10/01/2016  . Essential hypertension 01/21/2014    Past Surgical History:  Procedure Laterality Date  . NO PAST SURGERIES        OB History    Gravida  3   Para  2   Term  2   Preterm  0   AB      Living  2     SAB      TAB      Ectopic      Multiple      Live Births  2           Family History  Problem Relation Age of Onset  . Diabetes Mother   . Hypertension Mother   . Diabetes Maternal Grandmother   . Hyperlipidemia Maternal Grandmother   . Cancer Sister        cervical  . Asthma Son   . Allergies Son   . Eczema Son   . Asthma Son   . Allergies Son   . Eczema Son     Social History   Tobacco Use  . Smoking status: Current Every Day Smoker    Packs/day: 0.15    Years: 13.00    Pack years: 1.95    Types: Cigarettes  . Smokeless tobacco: Never Used  . Tobacco comment: no time to consider--bums off others.  Substance Use Topics  . Alcohol use: Yes    Comment: socially-not with pregnancy  . Drug use: Yes    Types: Marijuana    Home Medications Prior to Admission medications   Medication Sig Start Date End Date Taking? Authorizing Provider  amLODipine (NORVASC) 10 MG tablet Take 1 tablet (10 mg total) by mouth daily. 11/25/18   Kerin Perna, NP  atorvastatin (LIPITOR) 40 MG tablet Take 1 tablet (40 mg total) by mouth daily. 11/27/18   Kerin Perna, NP  glipiZIDE (GLUCOTROL) 10 MG tablet Take 1 tablet (10 mg total) by mouth 2 (two) times daily before a meal. 12/01/18   Kerin Perna, NP  hydrALAZINE (APRESOLINE) 25 MG tablet Take 1 tablet (25 mg total) by mouth 3 (three) times daily. 01/11/19   Kerin Perna, NP  isosorbide dinitrate (ISORDIL) 30 MG tablet Take 1 tablet (30 mg total) by mouth 3 (three) times daily. 01/11/19   Kerin Perna, NP  lisinopril-hydrochlorothiazide (ZESTORETIC) 20-25 MG tablet Take 1 tablet by mouth daily. 11/25/18   Kerin Perna, NP  metoprolol tartrate (LOPRESSOR) 25 MG tablet Take 1 tablet (25 mg total) by mouth 2 (two) times daily. 01/11/19   Kerin Perna, NP  ondansetron (ZOFRAN ODT) 4 MG  disintegrating tablet Take 1 tablet (4 mg total) by mouth every 8 (eight) hours as needed for nausea or vomiting. 01/27/19   Corena Herter, PA-C    Allergies    Orange, Peach [prunus persica], and Fentanyl  Review of Systems   Review of Systems  All other systems reviewed and are negative.   Physical Exam Updated Vital Signs BP 123/88   Pulse 83   Temp 98.9 F (37.2 C) (Oral)   Resp 18   Ht 5\' 6"  (1.676 m)   Wt 99.8 kg   LMP 01/07/2019 (Approximate)   SpO2 98%   BMI 35.51 kg/m   Physical Exam Vitals and nursing note reviewed. Exam conducted with a chaperone present.  Constitutional:      Appearance: Normal appearance.  HENT:     Head: Normocephalic and atraumatic.     Mouth/Throat:     Mouth: Mucous membranes are moist.  Eyes:     General: No scleral icterus.    Conjunctiva/sclera: Conjunctivae normal.  Cardiovascular:     Rate and Rhythm: Normal rate and regular rhythm.     Pulses: Normal pulses.     Heart sounds: Normal heart sounds.  Pulmonary:     Effort: Pulmonary effort is normal. No respiratory distress.     Breath sounds: Normal breath sounds. No wheezing or rales.  Abdominal:     Comments: Soft, nondistended.  Mild TTP diffusely, no guarding.  No overlying skin changes.  Negative McBurney point tenderness.  Negative Murphy sign.  Skin:    General: Skin is dry.  Neurological:     Mental Status: She is alert.     GCS: GCS eye subscore is 4. GCS verbal subscore is 5. GCS motor subscore is 6.  Psychiatric:        Mood and Affect: Mood normal.        Behavior: Behavior normal.        Thought Content: Thought content normal.     ED Results / Procedures / Treatments   Labs (all labs ordered are listed, but only abnormal results are displayed) Labs Reviewed  COMPREHENSIVE METABOLIC PANEL - Abnormal; Notable for the following components:      Result Value   Glucose, Bld 113 (*)    Creatinine, Ser 1.02 (*)    All other components within normal limits    BLOOD GAS, VENOUS - Abnormal; Notable for the following components:   pO2, Ven <31.0 (*)    All other components  within normal limits  SARS CORONAVIRUS 2 (TAT 6-24 HRS)  CBC    EKG None  Radiology No results found.  Procedures Procedures (including critical care time)  Medications Ordered in ED Medications  ondansetron (ZOFRAN-ODT) disintegrating tablet 4 mg (4 mg Oral Given 01/27/19 1205)    ED Course  I have reviewed the triage vital signs and the nursing notes.  Pertinent labs & imaging results that were available during my care of the patient were reviewed by me and considered in my medical decision making (see chart for details).    MDM Rules/Calculators/A&P                      Vital signs all within normal months.  Patient is oxygenating well at 100% on room air with no increased work of breathing or tachypnea.  Given chronicity of illness, will obtain testing for influenza.  Instead, will obtain send out COVID-19 testing.  No clinical signs of dehydration on my exam and she was neither tachycardic nor hypotensive.  Obtained CBC, CMP, and VBG given patient's reported diminished appetite and persistent nausea and vomiting to rule out an acidosis or electrolyte abnormalities.  VBG was reassuring and CBC demonstrated no abnormalities.  CMP also reassuring.  Patient feels improved with Zofran administered here in ED.  We will prescribe Zofran ODT to help with her nausea symptoms.  Encouraged patient to continue Tylenol or ibuprofen as needed for her fevers, chills, and generalized body aches.  She reports that she was already prescribed medication for her cough, with good effect.  Otherwise, encouraged regular meals and increased fluid hydration.  Isolation precautions and strict return precautions discussed.  All of the evaluation and work-up results were discussed with the patient and any family at bedside. They were provided opportunity to ask any additional questions and have  none at this time. They have expressed understanding of verbal discharge instructions as well as return precautions and are agreeable to the plan.   Rebecca Spence was evaluated in Emergency Department on 01/27/2019 for the symptoms described in the history of present illness. She was evaluated in the context of the global COVID-19 pandemic, which necessitated consideration that the patient might be at risk for infection with the SARS-CoV-2 virus that causes COVID-19. Institutional protocols and algorithms that pertain to the evaluation of patients at risk for COVID-19 are in a state of rapid change based on information released by regulatory bodies including the CDC and federal and state organizations. These policies and algorithms were followed during the patient's care in the ED.   Final Clinical Impression(s) / ED Diagnoses Final diagnoses:  Flu-like symptoms    Rx / DC Orders ED Discharge Orders         Ordered    ondansetron (ZOFRAN ODT) 4 MG disintegrating tablet  Every 8 hours PRN     01/27/19 1244           Reita Chard 01/27/19 1253    Virgel Manifold, MD 01/27/19 1446

## 2019-01-27 NOTE — ED Notes (Signed)
Pt verbalizes understanding of DC instructions. Pt belongings returned and is ambulatory out of ED.  

## 2019-02-22 ENCOUNTER — Encounter (INDEPENDENT_AMBULATORY_CARE_PROVIDER_SITE_OTHER): Payer: Self-pay | Admitting: Primary Care

## 2019-02-22 ENCOUNTER — Ambulatory Visit (INDEPENDENT_AMBULATORY_CARE_PROVIDER_SITE_OTHER): Payer: Self-pay | Admitting: Primary Care

## 2019-02-22 ENCOUNTER — Other Ambulatory Visit: Payer: Self-pay

## 2019-02-22 VITALS — BP 138/88 | HR 79 | Temp 97.3°F | Ht 66.0 in | Wt 236.2 lb

## 2019-02-22 DIAGNOSIS — I10 Essential (primary) hypertension: Secondary | ICD-10-CM

## 2019-02-22 DIAGNOSIS — F4323 Adjustment disorder with mixed anxiety and depressed mood: Secondary | ICD-10-CM

## 2019-02-22 DIAGNOSIS — Z760 Encounter for issue of repeat prescription: Secondary | ICD-10-CM

## 2019-02-22 DIAGNOSIS — Z09 Encounter for follow-up examination after completed treatment for conditions other than malignant neoplasm: Secondary | ICD-10-CM

## 2019-02-22 MED ORDER — AMLODIPINE BESYLATE 10 MG PO TABS: 10.0000 mg | ORAL_TABLET | Freq: Every day | ORAL | 3 refills | Status: DC

## 2019-02-22 MED ORDER — ATORVASTATIN CALCIUM 40 MG PO TABS: 40.0000 mg | ORAL_TABLET | Freq: Every day | ORAL | 1 refills | Status: DC

## 2019-02-22 MED ORDER — LISINOPRIL-HYDROCHLOROTHIAZIDE 20-25 MG PO TABS: 1.0000 | ORAL_TABLET | Freq: Every day | ORAL | 1 refills | Status: DC

## 2019-02-22 MED ORDER — FLUOXETINE HCL 20 MG PO TABS: 20.0000 mg | ORAL_TABLET | Freq: Every day | ORAL | 1 refills | Status: DC

## 2019-02-22 MED ORDER — GLIPIZIDE 10 MG PO TABS: 10.0000 mg | ORAL_TABLET | Freq: Two times a day (BID) | ORAL | 1 refills | Status: DC

## 2019-02-22 MED ORDER — HYDRALAZINE HCL 25 MG PO TABS: 25.0000 mg | ORAL_TABLET | Freq: Three times a day (TID) | ORAL | 1 refills | Status: DC

## 2019-02-22 MED ORDER — METOPROLOL TARTRATE 25 MG PO TABS: 25.0000 mg | ORAL_TABLET | Freq: Two times a day (BID) | ORAL | 1 refills | Status: DC

## 2019-02-22 MED ORDER — ISOSORBIDE DINITRATE 30 MG PO TABS: 30.0000 mg | ORAL_TABLET | Freq: Three times a day (TID) | ORAL | 1 refills | Status: DC

## 2019-02-22 MED FILL — ?FLUOXETINE HCL 20 MG CAPS: 20 | 30 days supply | Qty: 30 | Fill #0

## 2019-02-22 MED FILL — glipiZIDE 10 MG TABS: 10 | 30 days supply | Qty: 60 | Fill #0

## 2019-02-22 MED FILL — hydrALAZINE HCL 25 MG TABS: 25 | 30 days supply | Qty: 90 | Fill #0

## 2019-02-22 MED FILL — AMLODIPINE BESYLATE 10 MG T: 10 | 30 days supply | Qty: 30 | Fill #0

## 2019-02-22 MED FILL — ?ATORVASTATIN 40MG TABL: 40 | 30 days supply | Qty: 30 | Fill #0

## 2019-02-22 MED FILL — ?METOPROLOL 25 MG TABLET: 25 | 30 days supply | Qty: 60 | Fill #0

## 2019-02-22 MED FILL — ISOSORBIDE DN 30 MG TABLET: 30 | 30 days supply | Qty: 90 | Fill #0

## 2019-02-22 MED FILL — LISINOPRIL-HCTZ 20-25 MG TA: 20-25 | 30 days supply | Qty: 30 | Fill #0

## 2019-02-22 NOTE — Patient Instructions (Signed)
Agoraphobia Agoraphobia is a mental health disorder in which a person fears going out in public places where he or she may feel helpless, trapped, or embarrassed in the event of a panic attack. People with this condition have a fear of losing control during a panic attack, and they often start to avoid the situations that they fear or insist on having another person go with them. Agoraphobia may interfere with normal daily activities and personal relationships. People with severe agoraphobia may become completely homebound and dependent on others for daily tasks, such as grocery shopping and taking care of errands. Agoraphobia is a type of anxiety. It usually begins before age 67, but it can start in older adult years. People with agoraphobia are at risk for other anxiety disorders, depression, and substance abuse. What are the causes? The cause of this condition is not known. A variety of factors such as fear of sensations and emotions in anxiety (anxiety sensitivity), family history of anxiety (genetics), and stressful events may contribute to this condition. What increases the risk? You are more likely to develop this condition if:  You are a woman.  You have a panic disorder.  You have family members with agoraphobia. What are the signs or symptoms? You may have agoraphobia if you have any of the following symptoms for 6 months or longer:  Intense fear arising from two or more of the following: ? Using public transportation, such as cars, buses, planes, trains, or ships. ? Being in open spaces, such as parking lots, shopping malls, or bridges. ? Being in enclosed spaces, such as shops, theaters, or elevators. ? Standing in line or being in a crowd. ? Being outside the home alone.  Fear of being unable to escape or get help if feared events occur. These events include: ? Panic attack. ? Loss of bowel control in older adults.  Reacting to feared situations by: ? Avoiding  them. ? Requiring the presence of a companion. ? Enduring them with intense fear or anxiety.  Fear or anxiety that is out of proportion to the actual danger that is posed by the event and the situation. How is this diagnosed? This condition is diagnosed based on:  Your symptoms. You will be asked questions about your fears and how they have affected you.  Your medical history and your use of medicines, alcohol, or drugs.  Physical exam and lab tests. These are usually ordered to rule out other problems that may be causing your symptoms. You may be referred to a mental health specialist (psychiatrist or psychologist). How is this treated? This condition is usually treated using a combination of counseling and medicines.  Counseling or talk therapy. Talk therapy is provided by mental health specialists. The following forms of talk therapy can be especially helpful: ? Cognitive behavioral therapy (CBT). CBT helps you to recognize and change unrealistic thoughts and beliefs that contribute to your fears. You will learn that body changes associated with anxiety (such as increased heart rate and breathing) are completely normal and expected. ? Exposure therapy. This type of therapy helps you to face and overcome your fears in a relaxed state and in a safe environment. Exposures are usually approached in a systematic way, starting with situations that provoke less fear and building up to situations that provoke more intense fear. Exposure therapy includes:  Imagined exposure. You will imagine fearful situations and expose yourself to them in your mind.  In vivo exposure. You will face your fears in the real  world, such as by standing in a crowded place for a few minutes.  Interoceptive exposure. In a safe environment, you will practice experiencing body changes that are associated with panic attacks. One example is breathing through a straw to experience breathlessness.  Medicines. The following  types of medicines may be helpful: ? Antidepressants. These can decrease general levels of anxiety and can help to prevent panic attacks. ? Benzodiazepines. These medicines block feelings of anxiety and panic. ? Beta-blockers. Beta blockers can reduce physical symptoms of anxiety, such as sweating, tremors, and a racing heart. They may help you to feel less tense and anxious. Follow these instructions at home: Lifestyle  Try to exercise. Get 150 or more minutes of physical activity each week. Also aim to do strengthening exercises two or more times a week.  Eat a healthy diet that includes plenty of vegetables, fruits, whole grains, low-fat dairy products, and lean protein. Do not eat a lot of foods that are high in solid fats, added sugars, or salt (sodium).  Get the right amount and quality of sleep. Most adults need 7-9 hours of sleep each night.  Do not drink alcohol.  Do not use illegal drugs. General instructions  Take over-the-counter and prescription medicines only as told by your health care provider.  Keep all follow-up visits as told by your health care provider. This is important. Where to find more information  For more information, visit the website of the Anxiety and Depression Association of [place] (ADAA): https://www.clark.net/ Contact a health care provider if:  Your fear or anxiety gets worse.  You have new fears or anxieties. Get help right away if:  You have trouble breathing or have chest pain that you believe may not be part of a panic attack.  You have serious thoughts about hurting yourself or someone else. If you ever feel like you may hurt yourself or others, or have thoughts about taking your own life, get help right away. You can go to your nearest emergency department or call:  Your local emergency services (911 in the [place]  A suicide crisis helpline, such as the [place] at 438-023-7477. This is open 24 hours a  day. Summary  Agoraphobia is a type of anxiety disorder that causes a person to avoid situations that he or she fears, such as being in public or being in crowded spaces.  People with agoraphobia often have panic attacks. They may avoid situations in which they feel escape is difficult or panic attacks are likely to occur.  Agoraphobia is treated with medicines or cognitive behavioral therapy (CBT). This information is not intended to replace advice given to you by your health care provider. Make sure you discuss any questions you have with your health care provider. Document Revised: 04/28/2018 Document Reviewed: 08/29/2016 Elsevier Patient Education  [place].

## 2019-02-22 NOTE — Progress Notes (Signed)
Established Patient Office Visit  Subjective:  Patient ID: Rebecca Spence, female    DOB: 1980-03-10  Age: 39 y.o. MRN: [id number]  CC:  Chief Complaint  Patient presents with  . Hypertension    HPI Rebecca Spence presents for hospital follow up, management of hypertension. Recently diagnosis and hospitalized with COVID. Since than she has not left the house increase anxiety and crying at this visit just being around a new enviroment. Jumps when Ring comes on feels like she must colorox the whole house.  Past Medical History:  Diagnosis Date  . Bilateral ovarian cysts   . DM type 2 (diabetes mellitus, type 2) ([place]) 10/01/2016  . Eczema 10/29/2016   Nose and hands/forearms  . Essential hypertension 2016  . Gestational diabetes    09/2016:  prediabetes  . Headache(784.0)   . Hyperlipidemia 10/01/2016  . Prediabetes 10/01/2016   Eye exam with Shirleen Schirmer, M.D. negative for diabetic changes.    Past Surgical History:  Procedure Laterality Date  . NO PAST SURGERIES      Family History  Problem Relation Age of Onset  . Diabetes Mother   . Hypertension Mother   . Diabetes Maternal Grandmother   . Hyperlipidemia Maternal Grandmother   . Cancer Sister        cervical  . Asthma Son   . Allergies Son   . Eczema Son   . Asthma Son   . Allergies Son   . Eczema Son     Social History   Socioeconomic History  . Marital status: Single    Spouse name: Not on file  . Number of children: 2  . Years of education: associates degree working on.  Has GED  . Highest education level: Not on file  Occupational History  . 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 Smoking status: Current Every Day Smoker    Packs/day: 0.15    Years: 13.00    Pack years: 1.95    Types: Cigarettes  . Smokeless tobacco: Never Used  . Tobacco comment: no time to consider--bums off others.  Substance and Sexual Activity  . Alcohol use: Yes    Comment: socially-not with  pregnancy  . Drug use: Yes    Types: Marijuana  . Sexual activity: Yes    Birth control/protection: Injection, Condom  Other Topics Concern  . Not on file  Social History Narrative   Born and raised in [place].  Lives close to [employer] with 2 sons.  Father involved.   Mother takes care of children as well.   Social Determinants of Health   Financial Resource Strain:   . Difficulty of Paying Living Expenses: Not on file  Food Insecurity:   . Worried About Charity fundraiser in the Last Year: Not on file  . Ran Out of Food in the Last Year: Not on file  Transportation Needs:   . Lack of Transportation (Medical): Not on file  . Lack of Transportation (Non-Medical): Not on file  Physical Activity:   . Days of Exercise per Week: Not on file  . Minutes of Exercise per Session: Not on file  Stress:   . Feeling of Stress : Not on file  Social Connections:   . Frequency of Communication with Friends and Family: Not on file  . Frequency of Social Gatherings with Friends and Family: Not on file  . Attends Religious Services: Not on file  . Active Member of Clubs or Organizations:  Not on file  . Attends Archivist Meetings: Not on file  . Marital Status: Not on file  Intimate Partner Violence:   . Fear of Current or Ex-Partner: Not on file  . Emotionally Abused: Not on file  . Physically Abused: Not on file  . Sexually Abused: Not on file    Outpatient Medications Prior to Visit  Medication Sig Dispense Refill  . amLODipine (NORVASC) 10 MG tablet Take 1 tablet (10 mg total) by mouth daily. 90 tablet 3  . atorvastatin (LIPITOR) 40 MG tablet Take 1 tablet (40 mg total) by mouth daily. 90 tablet 1  . glipiZIDE (GLUCOTROL) 10 MG tablet Take 1 tablet (10 mg total) by mouth 2 (two) times daily before a meal. 60 tablet 3  . hydrALAZINE (APRESOLINE) 25 MG tablet Take 1 tablet (25 mg total) by mouth 3 (three) times daily. 90 tablet 1  . isosorbide dinitrate (ISORDIL) 30  MG tablet Take 1 tablet (30 mg total) by mouth 3 (three) times daily. 90 tablet 1  . lisinopril-hydrochlorothiazide (ZESTORETIC) 20-25 MG tablet Take 1 tablet by mouth daily. 30 tablet 3  . metoprolol tartrate (LOPRESSOR) 25 MG tablet Take 1 tablet (25 mg total) by mouth 2 (two) times daily. 180 tablet 1  . ondansetron (ZOFRAN ODT) 4 MG disintegrating tablet Take 1 tablet (4 mg total) by mouth every 8 (eight) hours as needed for nausea or vomiting. 20 tablet 0   No facility-administered medications prior to visit.    Allergies  Allergen Reactions  . Orange Anaphylaxis and Hives    "My throat starts closing up, my tongue, lips started swelling & burning.  Similar reaction to onions.  Caron Presume [Prunus Persica] Hives    Throat closes up, lips swell and burn  . Fentanyl     RN witnessed patient having hives and lip swelling.     ROS Review of Systems  Neurological: Positive for headaches.       When trying to go side of doors  Psychiatric/Behavioral: Positive for agitation, behavioral problems and sleep disturbance. The patient is nervous/anxious.   All other systems reviewed and are negative.     Objective:    Physical Exam  Constitutional: She is oriented to person, place, and time. She appears well-developed and well-nourished.  HENT:  Head: Normocephalic.  Eyes: Pupils are equal, round, and reactive to light. EOM are normal.  Cardiovascular: Normal rate and regular rhythm.  Pulmonary/Chest: Effort normal and breath sounds normal.  Abdominal: Soft. Bowel sounds are normal. She exhibits distension.  Musculoskeletal:        General: Normal range of motion.     Cervical back: Normal range of motion and neck supple.  Neurological: She is alert and oriented to person, place, and time. She has normal reflexes.  Skin: Skin is warm and dry.  Psychiatric:  Paranoid    BP 138/88 (BP Location: Left Arm, Patient Position: Sitting, Cuff Size: Normal)   Pulse 79   Temp (!) 97.3 F  (36.3 C) (Temporal)   Ht 5\' 6"  (1.676 m)   Wt 236 lb 3.2 oz (107.1 kg)   LMP 02/08/2019 (Approximate)   SpO2 93%   BMI 38.12 kg/m  Wt Readings from Last 3 Encounters:  02/22/19 236 lb 3.2 oz (107.1 kg)  01/27/19 220 lb (99.8 kg)  11/25/18 240 lb 9.6 oz (109.1 kg)     Health Maintenance Due  Topic Date Due  . PNEUMOCOCCAL POLYSACCHARIDE VACCINE AGE 40-64 HIGH RISK  09/05/1981  .  OPHTHALMOLOGY EXAM  09/05/1989    There are no preventive care reminders to display for this patient.  Lab Results  Component Value Date   TSH 0.988 07/09/2017   Lab Results  Component Value Date   WBC 6.9 01/27/2019   HGB 13.0 01/27/2019   HCT 41.2 01/27/2019   MCV 94.1 01/27/2019   PLT 288 01/27/2019   Lab Results  Component Value Date   NA 136 01/27/2019   K 3.9 01/27/2019   CO2 26 01/27/2019   GLUCOSE 113 (H) 01/27/2019   BUN 8 01/27/2019   CREATININE 1.02 (H) 01/27/2019   BILITOT 0.9 01/27/2019   ALKPHOS 104 01/27/2019   AST 19 01/27/2019   ALT 18 01/27/2019   PROT 8.1 01/27/2019   ALBUMIN 4.5 01/27/2019   CALCIUM 9.4 01/27/2019   ANIONGAP 10 01/27/2019   Lab Results  Component Value Date   CHOL 213 (H) 11/25/2018   Lab Results  Component Value Date   HDL 36 (L) 11/25/2018   Lab Results  Component Value Date   LDLCALC 136 (H) 11/25/2018   Lab Results  Component Value Date   TRIG 227 (H) 11/25/2018   Lab Results  Component Value Date   CHOLHDL 5.9 (H) 11/25/2018   Lab Results  Component Value Date   HGBA1C 6.3 (A) 11/25/2018      Assessment & Plan:  Zeinab was seen today for hypertension.  Diagnoses and all orders for this visit:  Essential hypertension Counseled on blood pressure goal of less than 130/80, low-sodium, DASH diet, medication compliance, 150 minutes of moderate intensity exercise per week. Discussed medication compliance, adverse effects.  Hospital discharge follow-up 01/27/2019 she presented to the emergency room with persistent nausea and  vomiting, crampy abdominal discomfort, diminished appetite, nasal congestion and rhinorrhea, nonproductive cough, generalized body aches, and inability to smell for 3 days. COVID +.   Adjustment disorder with mixed anxiety and depressed mood Paranoid unable to understand how she got COVID and was very ill. 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  Other orders/Medication refill -     lisinopril-hydrochlorothiazide (ZESTORETIC) 20-25 MG tablet; Take 1 tablet by mouth daily. -     glipiZIDE (GLUCOTROL) 10 MG tablet; Take 1 tablet (10 mg total) by mouth 2 (two) times daily before a meal. -     metoprolol tartrate (LOPRESSOR) 25 MG tablet; Take 1 tablet (25 mg total) by mouth 2 (two) times daily. -     amLODipine (NORVASC) 10 MG tablet; Take 1 tablet (10 mg total) by mouth daily. -     hydrALAZINE (APRESOLINE) 25 MG tablet; Take 1 tablet (25 mg total) by mouth 3 (three) times daily. -     isosorbide dinitrate (ISORDIL) 30 MG tablet; Take 1 tablet (30 mg total) by mouth 3 (three) times daily. -     atorvastatin (LIPITOR) 40 MG tablet; Take 1 tablet (40 mg total) by mouth daily.   Problem List Items Addressed This Visit    Essential hypertension - Primary   Relevant Medications   lisinopril-hydrochlorothiazide (ZESTORETIC) 20-25 MG tablet   metoprolol tartrate (LOPRESSOR) 25 MG tablet   amLODipine (NORVASC) 10 MG tablet   hydrALAZINE (APRESOLINE) 25 MG tablet   isosorbide dinitrate (ISORDIL) 30 MG tablet   atorvastatin (LIPITOR) 40 MG tablet    Other Visit Diagnoses  Hospital discharge follow-up       Adjustment disorder with mixed anxiety and depressed mood       Relevant Medications   FLUoxetine (PROZAC) 20 MG tablet   Medication refill          Meds ordered this encounter  Medications  . lisinopril-hydrochlorothiazide (ZESTORETIC) 20-25 MG tablet     Sig: Take 1 tablet by mouth daily.    Dispense:  90 tablet    Refill:  1  . glipiZIDE (GLUCOTROL) 10 MG tablet    Sig: Take 1 tablet (10 mg total) by mouth 2 (two) times daily before a meal.    Dispense:  180 tablet    Refill:  1  . metoprolol tartrate (LOPRESSOR) 25 MG tablet    Sig: Take 1 tablet (25 mg total) by mouth 2 (two) times daily.    Dispense:  180 tablet    Refill:  1  . amLODipine (NORVASC) 10 MG tablet    Sig: Take 1 tablet (10 mg total) by mouth daily.    Dispense:  90 tablet    Refill:  3  . hydrALAZINE (APRESOLINE) 25 MG tablet    Sig: Take 1 tablet (25 mg total) by mouth 3 (three) times daily.    Dispense:  90 tablet    Refill:  1  . isosorbide dinitrate (ISORDIL) 30 MG tablet    Sig: Take 1 tablet (30 mg total) by mouth 3 (three) times daily.    Dispense:  90 tablet    Refill:  1  . atorvastatin (LIPITOR) 40 MG tablet    Sig: Take 1 tablet (40 mg total) by mouth daily.    Dispense:  90 tablet    Refill:  1  . FLUoxetine (PROZAC) 20 MG tablet    Sig: Take 1 tablet (20 mg total) by mouth daily.    Dispense:  90 tablet    Refill:  1    Follow-up: Return for CSW ASAP tele.    Kerin Perna, NP

## 2019-02-23 ENCOUNTER — Ambulatory Visit (INDEPENDENT_AMBULATORY_CARE_PROVIDER_SITE_OTHER): Payer: Self-pay | Admitting: Licensed Clinical Social Worker

## 2019-02-23 DIAGNOSIS — F4323 Adjustment disorder with mixed anxiety and depressed mood: Secondary | ICD-10-CM

## 2019-02-23 NOTE — BH Specialist Note (Signed)
Integrated Behavioral Health Visit via Telemedicine (Telephone)  02/23/2019 Mardelle Matte [id number]   Session Start time: 8:45 AM  Session End time: 9:10 AM Total time: 25  Referring Provider: NP Oletta Lamas Type of Visit: Telephonic Patient location: Home [facility] Provider location: Office All persons participating in visit: LCSW and patient  Confirmed patient's address: Yes  Confirmed patient's phone number: Yes  Any changes to demographics: No   Confirmed patient's insurance: Yes  Any changes to patient's insurance: No   Discussed confidentiality: Yes    The following statements were read to the patient and/or legal guardian that are established with the [facility] Provider.  "The purpose of this phone visit is to provide behavioral health care while limiting exposure to the coronavirus (COVID19).  There is a possibility of technology failure and discussed alternative modes of communication if that failure occurs."  "By engaging in this telephone visit, you consent to the provision of healthcare.  Additionally, you authorize for your insurance to be billed for the services provided during this telephone visit."   Patient and/or legal guardian consented to telephone visit: Yes   PRESENTING CONCERNS: Patient and/or family reports the following symptoms/concerns: Pt reports difficulty managing mental health conditions since COVID-19 diagnosis and recent passing of best friend and cousin.  Symptoms include paranoia, unstable appetite, difficulty sleeping, racing thoughts, sweating, withdrawn behavior, panic attacks, and crying  Pt reports difficulty leaving the house and/or allowing anyone to enter. She receives strong support from mother. Pt is not interested in behavioral health services at this time.   Duration of problem: 1 month; Severity of problem: severe  STRENGTHS (Protective Factors/Coping Skills): Pt receives support from family Pt agreed to participate in  medication management  GOALS ADDRESSED: Patient will: 1.  Reduce symptoms of: anxiety and depression  2.  Increase knowledge and/or ability of: coping skills  3.  Demonstrate ability to: Increase healthy adjustment to current life circumstances and Increase adequate support systems for patient/family  INTERVENTIONS: Interventions utilized:  Solution-Focused Strategies, Supportive Counseling and Psychoeducation and/or Health Education Standardized Assessments completed: Not Needed  ASSESSMENT: Patient currently experiencing depression and anxiety triggered by recent COVID diagnosis and the passing of loved ones.   Patient may benefit from continued medication management. She is not interested in behavioral health services outside of medication management through PCP. LCSW provided support and encouragement. Healthy coping skills were discussed to assist in the management and/or decrease of symptoms  PLAN: 1. Follow up with behavioral health clinician on : Follow up appointment scheduled for 03/09/2019 2. Behavioral recommendations: Continue with medication management and utilize healthy strategies discussed 3. Referral(s): [place] (In Clinic)  [name], [place] 03/05/2019 9:59 AM

## 2019-02-25 ENCOUNTER — Ambulatory Visit (INDEPENDENT_AMBULATORY_CARE_PROVIDER_SITE_OTHER): Payer: Self-pay | Admitting: Primary Care

## 2019-03-09 ENCOUNTER — Telehealth (INDEPENDENT_AMBULATORY_CARE_PROVIDER_SITE_OTHER): Payer: Self-pay | Admitting: Licensed Clinical Social Worker

## 2019-03-09 ENCOUNTER — Encounter (INDEPENDENT_AMBULATORY_CARE_PROVIDER_SITE_OTHER): Payer: Self-pay | Admitting: Primary Care

## 2019-03-09 ENCOUNTER — Ambulatory Visit (INDEPENDENT_AMBULATORY_CARE_PROVIDER_SITE_OTHER): Payer: Self-pay | Admitting: Licensed Clinical Social Worker

## 2019-03-09 ENCOUNTER — Telehealth (INDEPENDENT_AMBULATORY_CARE_PROVIDER_SITE_OTHER): Payer: Self-pay | Admitting: Primary Care

## 2019-03-09 DIAGNOSIS — I10 Essential (primary) hypertension: Secondary | ICD-10-CM

## 2019-03-09 DIAGNOSIS — E785 Hyperlipidemia, unspecified: Secondary | ICD-10-CM

## 2019-03-09 DIAGNOSIS — Z720 Tobacco use: Secondary | ICD-10-CM

## 2019-03-09 DIAGNOSIS — F4323 Adjustment disorder with mixed anxiety and depressed mood: Secondary | ICD-10-CM

## 2019-03-09 NOTE — Progress Notes (Signed)
Virtual Visit via Telephone Note  I connected with Rebecca Spence on 03/09/19 at  4:10 PM EST by telephone and verified that I am speaking with the correct person using two identifiers.   I discussed the limitations, risks, security and privacy concerns of performing an evaluation and management service by telephone and the availability of in person appointments. I also discussed with the patient that there may be a patient responsible charge related to this service. The patient expressed understanding and agreed to proceed.   History of Present Illness: Rebecca Spence is having a follow up wed visit for blood pressure management she is has been  able to take her blood pressure systolic has not been over [id number] and diastolic ,/= 90 she endorses she is taking her medication as prescribed. Denies shortness of breath, headaches, chest pain or lower extremity edema. Since having COVID she is paranoid of germs causing sleep disturbance and agitation following up CSW.   Past Medical History:  Diagnosis Date  . Bilateral ovarian cysts   . DM type 2 (diabetes mellitus, type 2) ([place]) 10/01/2016  . Eczema 10/29/2016   Nose and hands/forearms  . Essential hypertension 2016  . Gestational diabetes    09/2016:  prediabetes  . Headache(784.0)   . Hyperlipidemia 10/01/2016  . Prediabetes 10/01/2016   Eye exam with Shirleen Schirmer, M.D. negative for diabetic changes.   Current Outpatient Medications on File Prior to Visit  Medication Sig Dispense Refill  . amLODipine (NORVASC) 10 MG tablet Take 1 tablet (10 mg total) by mouth daily. 90 tablet 3  . atorvastatin (LIPITOR) 40 MG tablet Take 1 tablet (40 mg total) by mouth daily. 90 tablet 1  . FLUoxetine (PROZAC) 20 MG tablet Take 1 tablet (20 mg total) by mouth daily. 90 tablet 1  . glipiZIDE (GLUCOTROL) 10 MG tablet Take 1 tablet (10 mg total) by mouth 2 (two) times daily before a meal. 180 tablet 1  . hydrALAZINE (APRESOLINE) 25 MG tablet Take 1 tablet  (25 mg total) by mouth 3 (three) times daily. 90 tablet 1  . isosorbide dinitrate (ISORDIL) 30 MG tablet Take 1 tablet (30 mg total) by mouth 3 (three) times daily. 90 tablet 1  . lisinopril-hydrochlorothiazide (ZESTORETIC) 20-25 MG tablet Take 1 tablet by mouth daily. 90 tablet 1   No current facility-administered medications on file prior to visit.   Observations/Objective: Review of Systems  Psychiatric/Behavioral: Positive for depression. The patient is nervous/anxious and has insomnia.   All other systems reviewed and are negative.  Assessment and Plan: Diagnoses and all orders for this visit:  Tobacco abuse She is aware of increased risk for lung cancer and other respiratory diseases . At this time provides her with comfort but recommend cessation. This will be reminded at each clinical visit.  Dyslipidemia Continue to  decrease your fatty foods, red meat, cheese, milk and increase fiber like whole grains and veggies.  Take atorvastatin 40mg  labs previously reviewed with her.  Essential hypertension Blood pressure is not at control on a consistent goal is 130/80 and sodium modification and exercise 30 mins daily.   Adjustment disorder with mixed anxiety and depressed mood Paranoia from having the COVID with germ a phobia and alter sleeping will follow up with CSW    Follow Up Instructions:    I discussed the assessment and treatment plan with the patient. The patient was provided an opportunity to ask questions and all were answered. The patient agreed with the plan and  demonstrated an understanding of the instructions.   The patient was advised to call back or seek an in-person evaluation if the symptoms worsen or if the condition fails to improve as anticipated.  I provided 12 minutes of non-face-to-face time during this encounter.   Kerin Perna, NP

## 2019-03-09 NOTE — Telephone Encounter (Signed)
Call placed to patient regarding scheduled IBH appointment. Pt shared that she recently just fell asleep and would like to reschedule appointment. She is still taking medication as prescribed.   Follow up appointment scheduled for 03/23/19.

## 2019-03-23 ENCOUNTER — Other Ambulatory Visit: Payer: Self-pay

## 2019-03-23 ENCOUNTER — Ambulatory Visit (INDEPENDENT_AMBULATORY_CARE_PROVIDER_SITE_OTHER): Payer: Self-pay | Admitting: Licensed Clinical Social Worker

## 2019-03-23 DIAGNOSIS — F4323 Adjustment disorder with mixed anxiety and depressed mood: Secondary | ICD-10-CM

## 2019-03-23 NOTE — BH Specialist Note (Signed)
Integrated Behavioral Health Visit via Telemedicine (Telephone)  03/23/2019 Mardelle Matte [id number]   Session Start time: 10:10 AM  Session End time: 10:20 AM Total time: 10  Referring Provider: NP Oletta Lamas Type of Visit: Telephonic Patient location: Home [facility] Provider location: Office All persons participating in visit: LCSW and Patient  Confirmed patient's address: Yes  Confirmed patient's phone number: Yes  Any changes to demographics: No   Confirmed patient's insurance: Yes  Any changes to patient's insurance: No   Discussed confidentiality: Yes    The following statements were read to the patient and/or legal guardian that are established with the [facility] Provider.  "The purpose of this phone visit is to provide behavioral health care while limiting exposure to the coronavirus (COVID19).  There is a possibility of technology failure and discussed alternative modes of communication if that failure occurs."  "By engaging in this telephone visit, you consent to the provision of healthcare.  Additionally, you authorize for your insurance to be billed for the services provided during this telephone visit."   Patient and/or legal guardian consented to telephone visit: Yes   PRESENTING CONCERNS: Patient and/or family reports the following symptoms/concerns: Pt reports compliance with medication for approx a month. She reports a decrease in panic attacks by half (4-2), improved quality of sleep, and denies any adverse side effects Duration of problem: Ongoing; Severity of problem: moderate  STRENGTHS (Protective Factors/Coping Skills): Pt is participating in medication management Pt is utilizing healthy coping skills Pt has desire to change  GOALS ADDRESSED: Patient will: 1.  Reduce symptoms of: anxiety and depression  2.  Increase knowledge and/or ability of: self-management skills  3.  Demonstrate ability to: Increase healthy adjustment to current life  circumstances  INTERVENTIONS: Interventions utilized:  Behavioral Activation Standardized Assessments completed: Not Needed  ASSESSMENT: Patient currently experiencing a decrease in anxiety and depression since taking medication for the last month.   Patient may benefit from continued medication management and utilization of healthy coping skills. LCSW commended patient for following through with strategies discussed at last session and encouraged behavioral activation to promote mood and purpose.  PLAN: 1. Follow up with behavioral health clinician on : Contact LCSW for behavioral health and/or resource needs 2. Behavioral recommendations: Continue to comply with medication and utilize strategies discussed 3. Referral(s): [place] (In Clinic)  [name], [place] 04/05/2019 4:34 PM

## 2019-04-07 MED FILL — ?ATORVASTATIN 40MG TABLET: 40 | 30 days supply | Qty: 30 | Fill #1

## 2019-04-07 MED FILL — hydrALAZINE HCL 25 MG TABS: 25 | 30 days supply | Qty: 90 | Fill #1

## 2019-04-07 MED FILL — LISINOPRIL-HCTZ 20-25 MG TA: 20-25 | 30 days supply | Qty: 30 | Fill #1

## 2019-04-07 MED FILL — ?METOPROLOL TART 25MG TABLE: 25 | 30 days supply | Qty: 60 | Fill #1

## 2019-04-07 MED FILL — AMLODIPINE BESYLATE 10 MG T: 10 | 30 days supply | Qty: 30 | Fill #1

## 2019-04-07 MED FILL — ?FLUOXETINE HCL 20 MG CAPS: 20 | 30 days supply | Qty: 30 | Fill #1

## 2019-04-07 MED FILL — ISOSORBIDE DN 30 MG TABLET: 30 | 30 days supply | Qty: 90 | Fill #1

## 2019-04-07 MED FILL — glipiZIDE 10 MG TABS: 10 | 30 days supply | Qty: 60 | Fill #1

## 2019-05-06 ENCOUNTER — Ambulatory Visit: Payer: Self-pay | Attending: Internal Medicine

## 2019-05-06 DIAGNOSIS — Z23 Encounter for immunization: Secondary | ICD-10-CM

## 2019-05-06 NOTE — Progress Notes (Signed)
   Covid-19 Vaccination Clinic  Name:  Rebecca Spence    MRN: [id number] DOB: September 21, 1980  05/06/2019  [name] was observed post Covid-19 immunization for 15 minutes without incident. She was provided with Vaccine Information Sheet and instruction to access the V-Safe system.   [name] was instructed to call 911 with any severe reactions post vaccine: Marland Kitchen Difficulty breathing  . Swelling of face and throat  . A fast heartbeat  . A bad rash all over body  . Dizziness and weakness   Immunizations Administered    Name Date Dose VIS Date Route   Pfizer COVID-19 Vaccine 05/06/2019 10:19 AM 0.3 mL 01/01/2019 Intramuscular   Manufacturer: [place]   Lot: [id number]   [place]: [id number]

## 2019-05-17 MED FILL — hydrALAZINE HCL 25 MG TABS: 25 | 30 days supply | Qty: 90 | Fill #1

## 2019-05-17 MED FILL — AMLODIPINE BESYLATE 10 MG T: 10 | 30 days supply | Qty: 30 | Fill #2

## 2019-05-17 MED FILL — LISINOPRIL-HCTZ 20-25 MG TA: 20-25 | 30 days supply | Qty: 30 | Fill #2

## 2019-05-17 MED FILL — FLUoxetine HCL 20 MG CAPS: 20 | 30 days supply | Qty: 30 | Fill #2

## 2019-05-17 MED FILL — METOPROLOL TARTRATE 25 MG T: 25 | 30 days supply | Qty: 60 | Fill #2

## 2019-05-17 MED FILL — glipiZIDE 10 MG TABS: 10 | 30 days supply | Qty: 60 | Fill #2

## 2019-05-17 MED FILL — ATORVASTATIN CALCIUM 40 MG: 40 | 30 days supply | Qty: 30 | Fill #2

## 2019-06-02 ENCOUNTER — Ambulatory Visit: Payer: Self-pay | Attending: Internal Medicine

## 2019-06-10 ENCOUNTER — Ambulatory Visit: Payer: Self-pay

## 2019-06-10 ENCOUNTER — Ambulatory Visit: Payer: Self-pay | Attending: Internal Medicine

## 2019-06-10 DIAGNOSIS — Z23 Encounter for immunization: Secondary | ICD-10-CM

## 2019-06-10 NOTE — Progress Notes (Signed)
   Covid-19 Vaccination Clinic  Name:  Rebecca Spence    MRN: [id number] DOB: 07/08/1980  06/10/2019  [name] was observed post Covid-19 immunization for 30 minutes based on pre-vaccination screening without incident. She was provided with Vaccine Information Sheet and instruction to access the V-Safe system.   [name] was instructed to call 911 with any severe reactions post vaccine: Marland Kitchen Difficulty breathing  . Swelling of face and throat  . A fast heartbeat  . A bad rash all over body  . Dizziness and weakness   Immunizations Administered    Name Date Dose VIS Date Route   Pfizer COVID-19 Vaccine 06/10/2019  2:53 PM 0.3 mL 03/17/2018 Intramuscular   Manufacturer: [place]   Lot: [id number]   [place]: [id number]

## 2019-07-27 MED FILL — ?GLIPIZIDE 10 MG TABLET: 10 | 30 days supply | Qty: 60 | Fill #2

## 2019-07-27 MED FILL — hydrALAZINE HCL 25 MG TABS: 25 | 30 days supply | Qty: 90 | Fill #1

## 2019-07-27 MED FILL — ?AMLODIPINE BESYL 10MG TABL: 10 | 30 days supply | Qty: 30 | Fill #2

## 2019-07-27 MED FILL — LISINOPRIL-HYDROCHLOROTHIAZ: 20-25 | 30 days supply | Qty: 30 | Fill #2

## 2019-07-27 MED FILL — ISOSORBIDE DN 30 MG TABLET: 30 | 30 days supply | Qty: 90 | Fill #1

## 2019-07-27 MED FILL — ?ATORVASTATIN 40MG TABLET: 40 | 30 days supply | Qty: 30 | Fill #2

## 2019-07-27 MED FILL — FLUoxetine HCL 20 MG CAPS: 20 | 30 days supply | Qty: 30 | Fill #2

## 2019-08-17 ENCOUNTER — Ambulatory Visit (INDEPENDENT_AMBULATORY_CARE_PROVIDER_SITE_OTHER): Payer: Self-pay | Admitting: *Deleted

## 2019-08-17 ENCOUNTER — Encounter (HOSPITAL_COMMUNITY): Payer: Self-pay | Admitting: Primary Care

## 2019-08-17 ENCOUNTER — Other Ambulatory Visit: Payer: Self-pay

## 2019-08-17 ENCOUNTER — Ambulatory Visit (HOSPITAL_COMMUNITY)
Admission: EM | Admit: 2019-08-17 | Discharge: 2019-08-17 | Disposition: A | Discharge status: Other Acute Inpatient Hospital | Payer: Self-pay

## 2019-08-17 NOTE — BH Assessment (Signed)
Patient wants to receive services with a psychiatrist on an outpatient basis to change her psychiatric medication    Patient denies SI/HI/Psychosis/Substance Abuse  Patient receives medication management from [place] contacted [place] with the patient and arranged for the on call crisis nurse to contact the patient in order to have her medication adjusted.    Patient declined MSN Exam

## 2019-08-17 NOTE — Telephone Encounter (Signed)
Please advice  

## 2019-08-17 NOTE — Telephone Encounter (Signed)
Reason for CRM: Pt has had three deaths in her family back to back, days apart and is currently at work experiencing a severe panic attack/ Pts cousin called to ask if counselor or theripist can contact Pt asap / please advise   This was sent as regular CRM, but cousin calling back. Pt can barely make out a sentence when she called her cousin. Per cousin, pt is having a severe panic attack  She is hoping someone can call her.  [phone]   Patient called - but she hung up before transfer. Called patient and she is having severe panic attack- she is having to leave work now- she thought she could go back- but she got upset when coworkers started talking to her about her recent loss. Patient is rapid breathing and tearful. She did slow her breathing with coaching and she was able to answer some questions. Her PCP office does not have appointments- and her therapist is not available today. Advised UC. Patient also advised she can go [id number] [address] [place] [phone]. She can walk in- she is going to do that.  Reason for Disposition . Symptoms interfere with work or school  Answer Assessment - Initial Assessment Questions 1. CONCERN: "What happened that made you call today?"     overwhelmed today- not sleeping and eating 2. ANXIETY SYMPTOM SCREENING: "Can you describe how you have been feeling?"  (e.g., tense, restless, panicky, anxious, keyed up, trouble sleeping, trouble concentrating)     Not sleeping, not eating 3. ONSET: "How long have you been feeling this way?"     Started with loss of family members. 4. RECURRENT: "Have you felt this way before?"  If Yes, ask: "What happened that time?" "What helped these feelings go away in the past?"      Last Wednesday- medications and family members helped 5. RISK OF HARM - SUICIDAL IDEATION:  "Do you ever have thoughts of hurting or killing yourself?"  (e.g., yes, no, no but preoccupation with thoughts  about death)   - INTENT:  "Do you have thoughts of hurting or killing yourself right NOW?" (e.g., yes, no, N/A)   - PLAN: "Do you have a specific plan for how you would do this?" (e.g., gun, knife, overdose, no plan, N/A)     no 6. RISK OF HARM - HOMICIDAL IDEATION:  "Do you ever have thoughts of hurting or killing someone else?"  (e.g., yes, no, no but preoccupation with thoughts about death)   - INTENT:  "Do you have thoughts of hurting or killing someone right NOW?" (e.g., yes, no, N/A)   - PLAN: "Do you have a specific plan for how you would do this?" (e.g., gun, knife, no plan, N/A)      no 7. FUNCTIONAL IMPAIRMENT: "How have things been going for you overall? Have you had more difficulty than usual doing your normal daily activities?"  (e.g., better, same, worse; self-care, school, work, interactions)     Not working 8. SUPPORT: "Who is with you now?" "Who do you live with?" "Do you have family or friends who you can talk to?"      Husband is with her now 92. THERAPIST: "Do you have a counselor or therapist? Name?"     Yes- Christa See- call to PCP office- she is not available today 10. STRESSORS: "Has there been any new stress or recent changes in your life?"       Recent death of family members- patient  is distressed- patient could barely talk at beginning of call- she is now able to answer questions and she is practicing her breathing. Call to PCP and there are no appointments available advised UC. Patient also advised she can go to crisis center- she is going to go now- she has transportation. [facility] [address]  [phone].  Protocols used: ANXIETY AND PANIC ATTACK-A-AH

## 2019-08-17 NOTE — Telephone Encounter (Signed)
Call received from Crisis control center at Behavioral health to prescribe medications for patient seen this am after calling [facility] for panic attacks. Appt scheduled  for 08/19/19. Called patient to inform of appt. Patient soft spoken and very appreciative of getting appt. No symptoms of panic attack noted via phone call. Care advise given to patient to call back if needed. Patient verbalized understanding to call back and of appt time.

## 2019-08-19 ENCOUNTER — Ambulatory Visit (INDEPENDENT_AMBULATORY_CARE_PROVIDER_SITE_OTHER): Payer: Self-pay | Admitting: Primary Care

## 2019-08-19 ENCOUNTER — Other Ambulatory Visit: Payer: Self-pay

## 2019-08-19 ENCOUNTER — Encounter (INDEPENDENT_AMBULATORY_CARE_PROVIDER_SITE_OTHER): Payer: Self-pay | Admitting: Primary Care

## 2019-08-19 ENCOUNTER — Other Ambulatory Visit (INDEPENDENT_AMBULATORY_CARE_PROVIDER_SITE_OTHER): Payer: Self-pay | Admitting: Primary Care

## 2019-08-19 VITALS — BP 134/85 | HR 74 | Temp 98.1°F | Ht 66.0 in | Wt 225.4 lb

## 2019-08-19 DIAGNOSIS — F4323 Adjustment disorder with mixed anxiety and depressed mood: Secondary | ICD-10-CM

## 2019-08-19 DIAGNOSIS — I10 Essential (primary) hypertension: Secondary | ICD-10-CM

## 2019-08-19 DIAGNOSIS — R7303 Prediabetes: Secondary | ICD-10-CM

## 2019-08-19 DIAGNOSIS — E785 Hyperlipidemia, unspecified: Secondary | ICD-10-CM

## 2019-08-19 DIAGNOSIS — Z720 Tobacco use: Secondary | ICD-10-CM

## 2019-08-19 LAB — POCT GLYCOSYLATED HEMOGLOBIN (HGB A1C): Hemoglobin A1C: 6.2 % — AB (ref 4.0–5.6)

## 2019-08-19 MED ORDER — LISINOPRIL-HYDROCHLOROTHIAZIDE 20-25 MG PO TABS: 1.0000 | ORAL_TABLET | Freq: Every day | ORAL | 1 refills | Status: DC

## 2019-08-19 MED ORDER — AMLODIPINE BESYLATE 10 MG PO TABS: 10.0000 mg | ORAL_TABLET | Freq: Every day | ORAL | 3 refills | Status: DC

## 2019-08-19 MED ORDER — GLIPIZIDE 10 MG PO TABS: 10.0000 mg | ORAL_TABLET | Freq: Two times a day (BID) | ORAL | 1 refills | Status: DC

## 2019-08-19 MED ORDER — ISOSORBIDE DINITRATE 30 MG PO TABS: 30.0000 mg | ORAL_TABLET | Freq: Three times a day (TID) | ORAL | 1 refills | Status: DC

## 2019-08-19 MED ORDER — GLIPIZIDE 5 MG PO TABS: 5.0000 mg | ORAL_TABLET | Freq: Two times a day (BID) | ORAL | 1 refills | Status: DC

## 2019-08-19 MED ORDER — BUSPIRONE HCL 5 MG PO TABS: 5.0000 mg | ORAL_TABLET | Freq: Two times a day (BID) | ORAL | 1 refills | Status: DC

## 2019-08-19 MED ORDER — ATORVASTATIN CALCIUM 40 MG PO TABS: 40.0000 mg | ORAL_TABLET | Freq: Every day | ORAL | 1 refills | Status: DC

## 2019-08-19 MED ORDER — HYDRALAZINE HCL 25 MG PO TABS: 25.0000 mg | ORAL_TABLET | Freq: Three times a day (TID) | ORAL | 1 refills | Status: DC

## 2019-08-19 MED ORDER — FLUOXETINE HCL 20 MG PO TABS: 40.0000 mg | ORAL_TABLET | Freq: Every day | ORAL | 1 refills | Status: DC

## 2019-08-19 MED FILL — busPIRone HCL 5 MG TABS: 5 | 30 days supply | Qty: 60 | Fill #0

## 2019-08-19 NOTE — Patient Instructions (Signed)

## 2019-08-19 NOTE — Progress Notes (Signed)
Established Patient Office Visit  Subjective:  Patient ID: Rebecca Spence, female    DOB: 19-Nov-1980  Age: 39 y.o. MRN: [id number]  CC:  Chief Complaint  Patient presents with  . Anxiety    HPI Rebecca Spence is a 39 year old female presents for anxiety and depression. Recently seen by behavioral health wanted to commit her however she has kids and no help to watch them.  Only substance patient use is [employer] which last her 3 days.  She has been under a lot of stress her mother that raised her she found unresponsive tried to do CPR but unsuccessful.  She just lost her aunt and cousin all within the last 39 month.  Her children are not feeling mom is not doing well and her son that is 74 years old has started sleeping with her to make sure she is okay. (Crying throughout entire visit. )  Past Medical History:  Diagnosis Date  . Bilateral ovarian cysts   . DM type 2 (diabetes mellitus, type 2) ([place]) 10/01/2016  . Eczema 10/29/2016   Nose and hands/forearms  . Essential hypertension 2016  . Gestational diabetes    09/2016:  prediabetes  . Headache(784.0)   . Hyperlipidemia 10/01/2016  . Prediabetes 10/01/2016   Eye exam with Shirleen Schirmer, M.D. negative for diabetic changes.    Past Surgical History:  Procedure Laterality Date  . NO PAST SURGERIES      Family History  Problem Relation Age of Onset  . Diabetes Mother   . Hypertension Mother   . Diabetes Maternal Grandmother   . Hyperlipidemia Maternal Grandmother   . Cancer Sister        cervical  . Asthma Son   . Allergies Son   . Eczema Son   . Asthma Son   . Allergies Son   . Eczema Son     Social History   Socioeconomic History  . Marital status: Single    Spouse name: Not on file  . Number of children: 2  . Years of education: associates degree working on.  Has GED  . Highest education level: Not on file  Occupational History  . 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 Smoking  status: Current Every Day Smoker    Packs/day: 0.15    Years: 13.00    Pack years: 1.95    Types: Cigarettes  . Smokeless tobacco: Never Used  . Tobacco comment: no time to consider--bums off others.  Vaping Use  . Vaping Use: Never used  Substance and Sexual Activity  . Alcohol use: Yes    Comment: socially-not with pregnancy  . Drug use: Yes    Types: Marijuana  . Sexual activity: Yes    Birth control/protection: Injection, Condom  Other Topics Concern  . Not on file  Social History Narrative   Born and raised in [place].  Lives close to [employer] with 2 sons.  Father involved.   Mother takes care of children as well.   Social Determinants of Health   Financial Resource Strain:   . Difficulty of Paying Living Expenses:   Food Insecurity:   . Worried About Charity fundraiser in the Last Year:   . Arboriculturist in the Last Year:   Transportation Needs:   . Film/video editor (Medical):   Marland Kitchen Lack of Transportation (Non-Medical):   Physical Activity:   . Days of Exercise per Week:   . Minutes  of Exercise per Session:   Stress:   . Feeling of Stress :   Social Connections:   . Frequency of Communication with Friends and Family:   . Frequency of Social Gatherings with Friends and Family:   . Attends Religious Services:   . Active Member of Clubs or Organizations:   . Attends Archivist Meetings:   Marland Kitchen Marital Status:   Intimate Partner Violence:   . Fear of Current or Ex-Partner:   . Emotionally Abused:   Marland Kitchen Physically Abused:   . Sexually Abused:     Outpatient Medications Prior to Visit  Medication Sig Dispense Refill  . amLODipine (NORVASC) 10 MG tablet Take 1 tablet (10 mg total) by mouth daily. 90 tablet 3  . atorvastatin (LIPITOR) 40 MG tablet Take 1 tablet (40 mg total) by mouth daily. 90 tablet 1  . FLUoxetine (PROZAC) 20 MG tablet Take 1 tablet (20 mg total) by mouth daily. 90 tablet 1  . glipiZIDE (GLUCOTROL) 10 MG tablet Take 1  tablet (10 mg total) by mouth 2 (two) times daily before a meal. 180 tablet 1  . hydrALAZINE (APRESOLINE) 25 MG tablet Take 1 tablet (25 mg total) by mouth 3 (three) times daily. 90 tablet 1  . isosorbide dinitrate (ISORDIL) 30 MG tablet Take 1 tablet (30 mg total) by mouth 3 (three) times daily. 90 tablet 1  . lisinopril-hydrochlorothiazide (ZESTORETIC) 20-25 MG tablet Take 1 tablet by mouth daily. 90 tablet 1   No facility-administered medications prior to visit.    Allergies  Allergen Reactions  . Orange Anaphylaxis and Hives    "My throat starts closing up, my tongue, lips started swelling & burning.  Similar reaction to onions.  Caron Presume [Prunus Persica] Hives    Throat closes up, lips swell and burn  . Fentanyl     RN witnessed patient having hives and lip swelling.     ROS Review of Systems  Constitutional: Positive for fatigue.  Psychiatric/Behavioral: Positive for sleep disturbance. The patient is nervous/anxious.        Depression  All other systems reviewed and are negative.     Objective:    Physical Exam Vitals reviewed.  Constitutional:      Appearance: She is obese.  HENT:     Head: Normocephalic.  Cardiovascular:     Rate and Rhythm: Normal rate and regular rhythm.     Pulses: Normal pulses.     Heart sounds: Normal heart sounds.  Pulmonary:     Effort: Pulmonary effort is normal.     Breath sounds: Normal breath sounds.  Abdominal:     General: Bowel sounds are normal.  Musculoskeletal:        General: Normal range of motion.     Cervical back: Normal range of motion and neck supple.  Skin:    General: Skin is warm and dry.  Neurological:     Mental Status: She is alert and oriented to person, place, and time.  Psychiatric:        Mood and Affect: Mood normal.        Behavior: Behavior normal.     BP (!) 134/85 (BP Location: Right Arm, Patient Position: Sitting, Cuff Size: Normal)   Pulse 74   Temp 98.1 F (36.7 C) (Oral)   Ht 5\' 6"  (1.676  m)   Wt (!) 225 lb 6.4 oz (102.2 kg)   SpO2 99%   BMI 36.38 kg/m  Wt Readings from Last 3 Encounters:  08/19/19 (!) 225 lb 6.4 oz (102.2 kg)  02/22/19 236 lb 3.2 oz (107.1 kg)  01/27/19 220 lb (99.8 kg)     Health Maintenance Due  Topic Date Due  . Hepatitis C Screening  Never done  . PNEUMOCOCCAL POLYSACCHARIDE VACCINE AGE 89-64 HIGH RISK  Never done  . OPHTHALMOLOGY EXAM  Never done    There are no preventive care reminders to display for this patient.  Lab Results  Component Value Date   TSH 0.988 07/09/2017   Lab Results  Component Value Date   WBC 6.9 01/27/2019   HGB 13.0 01/27/2019   HCT 41.2 01/27/2019   MCV 94.1 01/27/2019   PLT 288 01/27/2019   Lab Results  Component Value Date   NA 136 01/27/2019   K 3.9 01/27/2019   CO2 26 01/27/2019   GLUCOSE 113 (H) 01/27/2019   BUN 8 01/27/2019   CREATININE 1.02 (H) 01/27/2019   BILITOT 0.9 01/27/2019   ALKPHOS 104 01/27/2019   AST 19 01/27/2019   ALT 18 01/27/2019   PROT 8.1 01/27/2019   ALBUMIN 4.5 01/27/2019   CALCIUM 9.4 01/27/2019   ANIONGAP 10 01/27/2019   Lab Results  Component Value Date   CHOL 213 (H) 11/25/2018   Lab Results  Component Value Date   HDL 36 (L) 11/25/2018   Lab Results  Component Value Date   LDLCALC 136 (H) 11/25/2018   Lab Results  Component Value Date   TRIG 227 (H) 11/25/2018   Lab Results  Component Value Date   CHOLHDL 5.9 (H) 11/25/2018   Lab Results  Component Value Date   HGBA1C 6.2 (A) 08/19/2019      Assessment & Plan:  Rebecca Spence was seen today for anxiety.  Diagnoses and all orders for this visit:  Prediabetes -     HgB A1c 6.2 down from 8 months ago from 6.3 currently on glipizide 10 mg twice daily.  A1c is at goal we will decrease glipizide to 5 milligrams twice daily  Adjustment disorder with mixed anxiety and depressed mood Increased stress and depression is situational will increase Prozac to 40 mg daily this should also help with rest if taken at  bedtime added BuSpar 5 mg twice daily for anxiety She has a fine will to live for her children that is what wakes her up in the morning seen in their faces be and her mother and taking care of them. -     FLUoxetine (PROZAC) 20 MG tablet; Take 2 tablets (40 mg total) by mouth daily.  Tobacco abuse She does admit to smoking [place] 1 pack / 3 days major factor $7 a pack per day also done help with anxiety and relieving some stress.  She is aware of complications with smoking we will discuss cessation at each visit.  Essential hypertension Counseled on blood pressure goal of less than 130/80, low-sodium, DASH diet, medication compliance, 150 minutes of moderate intensity exercise per week. Discussed medication compliance, adverse effects. Amlodipine 10 mg daily hydralazine 25 mg 3 times a day isosorbide 30 mg grams daily lisinopril HCTZ 20/25 daily continue all medication as prescribed  Hyperlipidemia, unspecified hyperlipidemia type Monitor fatty foods to include red meat  cheese, milk and increase fiber like whole grains and veggies.  Continue taking atorvastatin 40 mg at bedtime  Other orders -     lisinopril-hydrochlorothiazide (ZESTORETIC) 20-25 MG tablet; Take 1 tablet by mouth daily. -     glipiZIDE (GLUCOTROL) 10 MG tablet; Take 1 tablet (10  mg total) by mouth 2 (two) times daily before a meal. Discontinue changed to 5 mg BID -     amLODipine (NORVASC) 10 MG tablet; Take 1 tablet (10 mg total) by mouth daily. -     hydrALAZINE (APRESOLINE) 25 MG tablet; Take 1 tablet (25 mg total) by mouth 3 (three) times daily. -     isosorbide dinitrate (ISORDIL) 30 MG tablet; Take 1 tablet (30 mg total) by mouth 3 (three) times daily. -     atorvastatin (LIPITOR) 40 MG tablet; Take 1 tablet (40 mg total) by mouth daily. -     busPIRone (BUSPAR) 5 MG tablet; Take 1 tablet (5 mg total) by mouth 2 (two) times daily.    Meds ordered this encounter  Medications  . lisinopril-hydrochlorothiazide  (ZESTORETIC) 20-25 MG tablet    Sig: Take 1 tablet by mouth daily.    Dispense:  90 tablet    Refill:  1  . DISCONTD: glipiZIDE (GLUCOTROL) 10 MG tablet    Sig: Take 1 tablet (10 mg total) by mouth 2 (two) times daily before a meal.    Dispense:  180 tablet    Refill:  1  . amLODipine (NORVASC) 10 MG tablet    Sig: Take 1 tablet (10 mg total) by mouth daily.    Dispense:  90 tablet    Refill:  3  . FLUoxetine (PROZAC) 20 MG tablet    Sig: Take 2 tablets (40 mg total) by mouth daily.    Dispense:  180 tablet    Refill:  1  . hydrALAZINE (APRESOLINE) 25 MG tablet    Sig: Take 1 tablet (25 mg total) by mouth 3 (three) times daily.    Dispense:  90 tablet    Refill:  1  . isosorbide dinitrate (ISORDIL) 30 MG tablet    Sig: Take 1 tablet (30 mg total) by mouth 3 (three) times daily.    Dispense:  90 tablet    Refill:  1  . atorvastatin (LIPITOR) 40 MG tablet    Sig: Take 1 tablet (40 mg total) by mouth daily.    Dispense:  90 tablet    Refill:  1  . busPIRone (BUSPAR) 5 MG tablet    Sig: Take 1 tablet (5 mg total) by mouth 2 (two) times daily.    Dispense:  180 tablet    Refill:  1  . glipiZIDE (GLUCOTROL) 5 MG tablet    Sig: Take 1 tablet (5 mg total) by mouth 2 (two) times daily before a meal.    Dispense:  180 tablet    Refill:  1    Follow-up: Return in about 6 weeks (around 09/30/2019) for anxiety and depression.    Kerin Perna, NP

## 2019-08-31 ENCOUNTER — Ambulatory Visit (INDEPENDENT_AMBULATORY_CARE_PROVIDER_SITE_OTHER): Payer: Self-pay | Admitting: Licensed Clinical Social Worker

## 2019-09-07 ENCOUNTER — Ambulatory Visit (INDEPENDENT_AMBULATORY_CARE_PROVIDER_SITE_OTHER): Payer: Self-pay | Admitting: Licensed Clinical Social Worker

## 2019-09-09 ENCOUNTER — Ambulatory Visit: Payer: Self-pay | Attending: Family Medicine | Admitting: Licensed Clinical Social Worker

## 2019-09-09 DIAGNOSIS — F411 Generalized anxiety disorder: Secondary | ICD-10-CM

## 2019-09-09 NOTE — BH Specialist Note (Signed)
Integrated Behavioral Health Visit via Telemedicine (Telephone)  09/09/2019 Rebecca Spence [id number]   Session Start time: 8:35 AM  Session End time: 8:55 AM Total time: 20 minutes  Referring Provider: NP Oletta Lamas Type of Visit: Telephonic Patient location: [facility] Provider location: Office All persons participating in visit: LCSW and patient   Discussed confidentiality: Yes   "By engaging in this telephone visit, you consent to the provision of healthcare.  Additionally, you authorize for your insurance to be billed for the services provided during this telephone visit."   Patient and/or legal guardian consented to telephone visit: Yes   PRESENTING CONCERNS: Patient and/or family reports the following symptoms/concerns: Pt reports ongoing anxiety attacks that occur through out the day with no identified triggers. Additional symptoms of anxiety reported as difficulty sleeping (3 hrs), heart palpitations, trembling, and decreased appetite Duration of problem: Ongoing; Severity of problem: severe  STRENGTHS (Protective Factors/Coping Skills): Pt has good insight Pt identified children as motivation agents Pt is participating in medication management through PCP  GOALS ADDRESSED: Patient will: 1.  Reduce symptoms of: anxiety and insomnia  2.  Increase knowledge and/or ability of: coping skills and healthy habits  3.  Demonstrate ability to: Increase healthy adjustment to current life circumstances, Increase adequate support systems for patient/family and Begin healthy grieving over loss  INTERVENTIONS: Interventions utilized:  Solution-Focused Strategies, Mindfulness or Relaxation Training and Supportive Counseling Standardized Assessments completed: Not Needed  ASSESSMENT: Patient currently experiencing difficulty managing anxiety symptoms triggered by grief and psychosocial stressors. Pt denies current SI/HI.    Patient may benefit from continued medication management  and therapy. Grounding interventions discussed, in addition, to strategies to improve sleep. Pt endorses that buspar has not been effective in reducing symptoms. Provider will be notified.   PLAN: 1. Follow up with behavioral health clinician on : 09/16/2019 2. Behavioral recommendations: Utililze strategies discussed and continue to comply with medications 3. Referral(s): [name] (In Clinic)  Rebecca Spence, [place] 09/09/2019 9:01 AM   Confirmed patient's address: Yes  Confirmed patient's phone number: Yes  Any changes to demographics: No   Confirmed patient's insurance: Yes  Any changes to patient's insurance: No    The following statements were read to the patient and/or legal guardian that are established with the [facility] Provider.  "The purpose of this phone visit is to provide behavioral health care while limiting exposure to the coronavirus (COVID19).  There is a possibility of technology failure and discussed alternative modes of communication if that failure occurs."

## 2019-09-15 ENCOUNTER — Other Ambulatory Visit (INDEPENDENT_AMBULATORY_CARE_PROVIDER_SITE_OTHER): Payer: Self-pay | Admitting: Primary Care

## 2019-09-15 DIAGNOSIS — F4323 Adjustment disorder with mixed anxiety and depressed mood: Secondary | ICD-10-CM

## 2019-09-16 ENCOUNTER — Ambulatory Visit: Payer: Self-pay | Attending: Primary Care | Admitting: Licensed Clinical Social Worker

## 2019-09-16 ENCOUNTER — Other Ambulatory Visit: Payer: Self-pay

## 2019-09-16 DIAGNOSIS — F411 Generalized anxiety disorder: Secondary | ICD-10-CM

## 2019-09-16 NOTE — BH Specialist Note (Signed)
Integrated Behavioral Health Visit via Telemedicine (Telephone)  09/16/2019 Rebecca Spence [id number]   Session Start time: 3:40  Session End time: 4:00 PM Total time: 20 minutes  Referring Provider: NP Rebecca Spence Type of Visit: Telephonic Patient location: Home [facility] Provider location: Office All persons participating in visit: LCSW and Patient   Discussed confidentiality: Yes   "By engaging in this telephone visit, you consent to the provision of healthcare.  Additionally, you authorize for your insurance to be billed for the services provided during this telephone visit."   Patient and/or legal guardian consented to telephone visit: Yes   PRESENTING CONCERNS: Patient and/or family reports the following symptoms/concerns: Pt reports difficulty managing increase in panic attacks. She endorses 2 hours of sleep each night. States that employer suggested patient complete FMLA to focus on getting better Duration of problem: Ongoing; Severity of problem: moderate  STRENGTHS (Protective Factors/Coping Skills): Pt has social connections  GOALS ADDRESSED: Patient will: 1.  Reduce symptoms of: anxiety  2.  Increase knowledge and/or ability of: self-management skills  3.  Demonstrate ability to: Increase healthy adjustment to current life circumstances and Increase adequate support systems for patient/family   PROGRESS OF GOALS: Ongoing  INTERVENTIONS: Interventions utilized:  Solution-Focused Strategies Standardized Assessments completed: Not Needed  ASSESSMENT: Patient currently experiencing difficulty managing increase in anxiety symptoms. Pt endorses multiple panic attacks throughout the day, states employer suggested pt complete FMLA to assist with management of mental health.    Patient may benefit from therapy and continued medication management. Grounding strategies were discussed and LCSW informed patient of supportive programs provided through employer ([employer]) Pt is open  to psychiatry and counseling. Message to PCP regarding referral to [facility] completed.  PLAN: 1. Follow up with behavioral health clinician on : Contact LCSW regarding behavioral health and/or resource needs 2. Behavioral recommendations: Pt agreed to utilize grounding strategies to assist with management of panic attacks and comply with medication management 3. Referral(s): [place] (LME/Outside Clinic)  [name], [place] 10/05/2019 7:06 AM   Confirmed patient's address: Yes  Confirmed patient's phone number: Yes  Any changes to demographics: No   Confirmed patient's insurance: Yes  Any changes to patient's insurance: No    The following statements were read to the patient and/or legal guardian that are established with the [facility] Provider.  "The purpose of this phone visit is to provide behavioral health care while limiting exposure to the coronavirus (COVID19).  There is a possibility of technology failure and discussed alternative modes of communication if that failure occurs."

## 2019-09-30 ENCOUNTER — Telehealth: Payer: Self-pay | Admitting: Licensed Clinical Social Worker

## 2019-09-30 ENCOUNTER — Ambulatory Visit (INDEPENDENT_AMBULATORY_CARE_PROVIDER_SITE_OTHER): Payer: Self-pay | Admitting: Primary Care

## 2019-09-30 NOTE — Telephone Encounter (Signed)
Follow up call placed to patient. Voicemail has not been set-up; therefore, LCSW was unable to leave message requesting a return call.

## 2019-10-04 ENCOUNTER — Telehealth (INDEPENDENT_AMBULATORY_CARE_PROVIDER_SITE_OTHER): Payer: Self-pay | Admitting: Psychiatry

## 2019-10-04 ENCOUNTER — Ambulatory Visit (INDEPENDENT_AMBULATORY_CARE_PROVIDER_SITE_OTHER): Payer: Self-pay | Admitting: *Deleted

## 2019-10-04 ENCOUNTER — Encounter (HOSPITAL_COMMUNITY): Payer: Self-pay | Admitting: Psychiatry

## 2019-10-04 DIAGNOSIS — F4323 Adjustment disorder with mixed anxiety and depressed mood: Secondary | ICD-10-CM

## 2019-10-04 DIAGNOSIS — F4321 Adjustment disorder with depressed mood: Secondary | ICD-10-CM

## 2019-10-04 DIAGNOSIS — F331 Major depressive disorder, recurrent, moderate: Secondary | ICD-10-CM

## 2019-10-04 DIAGNOSIS — F5102 Adjustment insomnia: Secondary | ICD-10-CM

## 2019-10-04 MED ORDER — FLUOXETINE HCL 20 MG PO TABS: 40.0000 mg | ORAL_TABLET | Freq: Every day | ORAL | 0 refills | Status: DC

## 2019-10-04 MED ORDER — TRAZODONE HCL 50 MG PO TABS: 50.0000 mg | ORAL_TABLET | Freq: Every day | ORAL | 0 refills | Status: DC

## 2019-10-04 MED FILL — traZODone HCL 50 MG TABS: 50 | 30 days supply | Qty: 30 | Fill #0

## 2019-10-04 MED FILL — FLUoxetine HCL 20 MG TABS: 20 | 30 days supply | Qty: 60 | Fill #0

## 2019-10-04 NOTE — Progress Notes (Signed)
Psychiatric Initial Adult Assessment   Patient Identification: Rebecca Spence MRN:  [id number] Date of Evaluation:  10/04/2019 Referral Source: [facility] Specialist social worker, PCP Chief Complaint:  depression Visit Diagnosis:    ICD-10-CM   1. MDD (major depressive disorder), recurrent episode, moderate (HCC)  F33.1   2. Adjustment disorder with mixed anxiety and depressed mood  F43.23 FLUoxetine (PROZAC) 20 MG tablet  3. Adjustment insomnia  F51.02     I connected with Janalyn Shy on 10/04/19 at  2:00 PM EDT by a video enabled telemedicine application and verified that I am speaking with the correct person using two identifiers.   I discussed the limitations of evaluation and management by telemedicine and the availability of in person appointments. The patient expressed understanding and agreed to proceed.  Patient location : home Provider location: home office  History of Present Illness:  39 years old AA female has 2 boys age 64, 17 lives with her. 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 Then Aunt died and 2 cousins back to back. Also lost friend murdered earlier part of this year. Has been feeling low  Losses have added to her grief, depression, endorses crying spells, gets withdrawn, hopeless at times, chest pounding with anxiety and panic. Denies psychotic symptoms or mania currently or in the past  Has been having poor sleep, cant focus and gets overwhelemed As per note one of her boy has started sleeping with her so to give her relief and she has support  She has been on prozac 20mg  earlier part of the year that helped some but recent events have added more to depression as above Recently buspar added and changed to bid but still gets anxious, poor sleep effects her functioning and feels unrest with depression next day  Denies hallucinations or  suicidal thoughts Has seen [facility] social worker and referred for med review to adjust as she feels she cannot distract from worries and dwells on the loved ones lost  Denies need to go hospital, currently working PT and could not function FT Says struggling with depression    Aggravating factor: multiple deaths this year, including 4 deaths last few weeks with trying to revive [employer] mom who got unconscious also coworker or friend murdered this year Modifying factor: boys, BF Duration since early 2021  Drug use: says stopped using THC this year Denies prior hospitalization or suicide attempt   Past Psychiatric History: denies, but dealing with deaths and depression this year  Previous Psychotropic Medications: No   Substance Abuse History in the last 12 months:  No.  Consequences of Substance Abuse: NA  Past Medical History:  Past Medical History:  Diagnosis Date  . Bilateral ovarian cysts   . DM type 2 (diabetes mellitus, type 2) ([place]) 10/01/2016  . Eczema 10/29/2016   Nose and hands/forearms  . Essential hypertension 2016  . Gestational diabetes    09/2016:  prediabetes  . Headache(784.0)   . Hyperlipidemia 10/01/2016  . Prediabetes 10/01/2016   Eye exam with Shirleen Schirmer, M.D. negative for diabetic changes.    Past Surgical History:  Procedure Laterality Date  . NO PAST SURGERIES      Family Psychiatric History: GM had some mental concern, diagnosis not given  Family History:  Family History  Problem Relation Age of Onset  . Diabetes Mother   . Hypertension Mother   . Diabetes Maternal  Grandmother   . Hyperlipidemia Maternal Grandmother   . Cancer Sister        cervical  . Asthma Son   . Allergies Son   . Eczema Son   . Asthma Son   . Allergies Son   . Eczema Son     Social History:   Social History   Socioeconomic History  . Marital status: Single    Spouse name: Not on file  . Number of children: 2  . Years of education: associates degree working on.   Has GED  . Highest education level: Not on file  Occupational History  . 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 Smoking status: Current Every Day Smoker    Packs/day: 0.15    Years: 13.00    Pack years: 1.95    Types: Cigarettes  . Smokeless tobacco: Never Used  . Tobacco comment: no time to consider--bums off others.  Vaping Use  . Vaping Use: Never used  Substance and Sexual Activity  . Alcohol use: Yes    Comment: socially-not with pregnancy  . Drug use: Yes    Types: Marijuana  . Sexual activity: Yes    Birth control/protection: Injection, Condom  Other Topics Concern  . Not on file  Social History Narrative   Born and raised in [place].  Lives close to [employer] with 2 sons.  Father involved.   Mother takes care of children as well.   Social Determinants of Health   Financial Resource Strain:   . Difficulty of Paying Living Expenses: Not on file  Food Insecurity:   . Worried About Charity fundraiser in the Last Year: Not on file  . Ran Out of Food in the Last Year: Not on file  Transportation Needs:   . Lack of Transportation (Medical): Not on file  . Lack of Transportation (Non-Medical): Not on file  Physical Activity:   . Days of Exercise per Week: Not on file  . Minutes of Exercise per Session: Not on file  Stress:   . Feeling of Stress : Not on file  Social Connections:   . Frequency of Communication with Friends and Family: Not on file  . Frequency of Social Gatherings with Friends and Family: Not on file  . Attends Religious Services: Not on file  . Active Member of Clubs or Organizations: Not on file  . Attends Archivist Meetings: Not on file  . Marital Status: Not on file    Additional Social History: grew up with mom and GM who raised her. Some difficult growing up but denies trauma memories if any to bother as trigger   Allergies:   Allergies  Allergen Reactions  . Orange Anaphylaxis and Hives    "My  throat starts closing up, my tongue, lips started swelling & burning.  Similar reaction to onions.  Caron Presume [Prunus Persica] Hives    Throat closes up, lips swell and burn  . Fentanyl     RN witnessed patient having hives and lip swelling.     Metabolic Disorder Labs: Lab Results  Component Value Date   HGBA1C 6.2 (A) 08/19/2019   No results found for: PROLACTIN Lab Results  Component Value Date   CHOL 213 (H) 11/25/2018   TRIG 227 (H) 11/25/2018   HDL 36 (L) 11/25/2018   CHOLHDL 5.9 (H) 11/25/2018   LDLCALC 136 (H) 11/25/2018   LDLCALC 101 (H) 09/23/2017   Lab Results  Component Value Date  TSH 0.988 07/09/2017    Therapeutic Level Labs: No results found for: LITHIUM No results found for: CBMZ No results found for: VALPROATE  Current Medications: Current Outpatient Medications  Medication Sig Dispense Refill  . amLODipine (NORVASC) 10 MG tablet Take 1 tablet (10 mg total) by mouth daily. 90 tablet 3  . atorvastatin (LIPITOR) 40 MG tablet Take 1 tablet (40 mg total) by mouth daily. 90 tablet 1  . busPIRone (BUSPAR) 5 MG tablet Take 1 tablet (5 mg total) by mouth 2 (two) times daily. 180 tablet 1  . FLUoxetine (PROZAC) 20 MG tablet Take 2 tablets (40 mg total) by mouth daily. Delete prior refill. 60 tablet 0  . glipiZIDE (GLUCOTROL) 5 MG tablet Take 1 tablet (5 mg total) by mouth 2 (two) times daily before a meal. 180 tablet 1  . hydrALAZINE (APRESOLINE) 25 MG tablet Take 1 tablet (25 mg total) by mouth 3 (three) times daily. 90 tablet 1  . isosorbide dinitrate (ISORDIL) 30 MG tablet Take 1 tablet (30 mg total) by mouth 3 (three) times daily. 90 tablet 1  . lisinopril-hydrochlorothiazide (ZESTORETIC) 20-25 MG tablet Take 1 tablet by mouth daily. 90 tablet 1  . traZODone (DESYREL) 50 MG tablet Take 1 tablet (50 mg total) by mouth at bedtime. 30 tablet 0   No current facility-administered medications for this visit.      Psychiatric Specialty Exam: Review of Systems   Cardiovascular: Negative for chest pain.  Psychiatric/Behavioral: Positive for dysphoric mood. Negative for agitation and behavioral problems.    There were no vitals taken for this visit.There is no height or weight on file to calculate BMI.  General Appearance: Casual  Eye Contact:  Fair  Speech:  Slow  Volume:  Decreased  Mood:  Dysphoric  Affect:  Congruent  Thought Process:  Goal Directed  Orientation:  Full (Time, Place, and Person)  Thought Content:  Rumination  Suicidal Thoughts:  No  Homicidal Thoughts:  No  Memory:  Immediate;   Fair Recent;   Fair  Judgement:  Fair  Insight:  Shallow  Psychomotor Activity:  Decreased  Concentration:  Concentration: Fair and Attention Span: Fair  Recall:  [employer] of Knowledge:Fair  Language: Good  Akathisia:  No  Handed:   AIMS (if indicated):  not done  Assets:  Desire for Improvement Housing Physical Health  ADL's:  Intact  Cognition: WNL  Sleep:  Poor   Screenings: GAD-7     Office Visit from 08/19/2019 in [place] Office Visit from 02/22/2019 in [place] Office Visit from 01/11/2019 in [place] Office Visit from 11/25/2018 in [place]  Total GAD-7 Score 7 14 0 7    PHQ2-9     Office Visit from 08/19/2019 in [place] Office Visit from 02/22/2019 in [place] Office Visit from 01/11/2019 in [place] Office Visit from 11/25/2018 in [place]  PHQ-2 Total Score 4 4 0 4  PHQ-9 Total Score 12 8 0 10      Assessment and Plan: as follows  MDD moderate to severe with Grief as specifier: increase prozac to 40mg . Has been taking 20mg  , refer to therapy  GAD: increase prozac as above, continue buspar bid Insomnia: adjustment to depression, work on sleep hygiene, will add trazadone 50mg   Provided supportive therapy, recommend she  start counselling for grief  I discussed the assessment and treatment plan with the patient. The patient was provided an opportunity to ask questions and all were answered. The patient agreed with the plan and demonstrated an understanding of the instructions.   The patient was advised to call back or seek an in-person evaluation if the symptoms worsen or if the condition fails to improve as anticipated. FU 2-3 weeks or earlier if needed or if symptoms worsen  I provided 40  minutes of non-face-to-face time during this encounter. Merian Capron, MD 9/13/20212:24 PM

## 2019-10-04 NOTE — Telephone Encounter (Signed)
Patient calls after being tested for Covid at [facility] location per her supervisor. She reported having relatives in her home over the weekend due to family funerals.  Headache and chest tightness with diarrhea this morning. Stated her chest tightness was with activity.Advised to continue to quarantine until her test results are available later today. Tylenol and cold cloth in a dark area for headache. Sips of fluids every hour until she feels up to trying bland foods like crackers, bananas or bread. Imodium OTC as directions read.Others in the home should quarantine also until her results are available. [facility] will contact the patient with her Covid results. Monitor your breathing and with concerns/changes call your doctor or seek treatment at the ED/UC immediately with a mask on. Patient verbalized understanding advice.

## 2019-10-07 ENCOUNTER — Other Ambulatory Visit: Payer: Self-pay

## 2019-10-07 ENCOUNTER — Encounter (INDEPENDENT_AMBULATORY_CARE_PROVIDER_SITE_OTHER): Payer: Self-pay | Admitting: Primary Care

## 2019-10-07 ENCOUNTER — Ambulatory Visit (INDEPENDENT_AMBULATORY_CARE_PROVIDER_SITE_OTHER): Payer: Self-pay | Admitting: Primary Care

## 2019-10-07 VITALS — BP 139/89 | HR 88 | Temp 97.5°F | Ht 66.0 in | Wt 227.2 lb

## 2019-10-07 DIAGNOSIS — I10 Essential (primary) hypertension: Secondary | ICD-10-CM

## 2019-10-07 DIAGNOSIS — R519 Headache, unspecified: Secondary | ICD-10-CM

## 2019-10-07 DIAGNOSIS — Z20822 Contact with and (suspected) exposure to covid-19: Secondary | ICD-10-CM

## 2019-10-07 NOTE — Progress Notes (Signed)
Established Patient Office Visit  Subjective:  Patient ID: Rebecca Spence, female    DOB: [birth year]  Age: 39 y.o. MRN: [id number]  CC:  Chief Complaint  Patient presents with  . Anxiety  . Depression    HPI [name] is 39 year old female presents for initially anxiety and  depression - since appointment seen by psyche and being managed. Today she just got off of work with headache, nasal congestion and fatigue. States she was tested and was negative but her son who lives with her -shows me his results COVID + and can unable to return to school. Asked patient did she let [facility] know she was in direct test contact. Allowed to work.  Called Dr.Synder concern patient was in person in the office for a visit. Unable to reach [place].   Past Medical History:  Diagnosis Date  . Bilateral ovarian cysts   . DM type 2 (diabetes mellitus, type 2) ([place]) 10/01/2016  . Eczema 10/29/2016   Nose and hands/forearms  . Essential hypertension 2016  . Gestational diabetes    09/2016:  prediabetes  . Headache(784.0)   . Hyperlipidemia 10/01/2016  . Prediabetes 10/01/2016   Eye exam with Shirleen Schirmer, M.D. negative for diabetic changes.    Past Surgical History:  Procedure Laterality Date  . NO PAST SURGERIES      Family History  Problem Relation Age of Onset  . Diabetes Mother   . Hypertension Mother   . Diabetes Maternal Grandmother   . Hyperlipidemia Maternal Grandmother   . Cancer Sister        cervical  . Asthma Son   . Allergies Son   . Eczema Son   . Asthma Son   . Allergies Son   . Eczema Son     Social History   Socioeconomic History  . Marital status: Single    Spouse name: Not on file  . Number of children: 2  . Years of education: associates degree working on.  Has GED  . Highest education level: Not on file  Occupational History  . 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 Smoking status: Current Every Day Smoker    Packs/day: 0.15     Years: 13.00    Pack years: 1.95    Types: Cigarettes  . Smokeless tobacco: Never Used  . Tobacco comment: no time to consider--bums off others.  Vaping Use  . Vaping Use: Never used  Substance and Sexual Activity  . Alcohol use: Yes    Comment: socially-not with pregnancy  . Drug use: Yes    Types: Marijuana  . Sexual activity: Yes    Birth control/protection: Injection, Condom  Other Topics Concern  . Not on file  Social History Narrative   Born and raised in [place].  Lives close to [employer] with 2 sons.  Father involved.   Mother takes care of children as well.   Social Determinants of Health   Financial Resource Strain:   . Difficulty of Paying Living Expenses: Not on file  Food Insecurity:   . Worried About Charity fundraiser in the Last Year: Not on file  . Ran Out of Food in the Last Year: Not on file  Transportation Needs:   . Lack of Transportation (Medical): Not on file  . Lack of Transportation (Non-Medical): Not on file  Physical Activity:   . Days of Exercise per Week: Not on file  . Minutes of Exercise  per Session: Not on file  Stress:   . Feeling of Stress : Not on file  Social Connections:   . Frequency of Communication with Friends and Family: Not on file  . Frequency of Social Gatherings with Friends and Family: Not on file  . Attends Religious Services: Not on file  . Active Member of Clubs or Organizations: Not on file  . Attends Archivist Meetings: Not on file  . Marital Status: Not on file  Intimate Partner Violence:   . Fear of Current or Ex-Partner: Not on file  . Emotionally Abused: Not on file  . Physically Abused: Not on file  . Sexually Abused: Not on file    Outpatient Medications Prior to Visit  Medication Sig Dispense Refill  . amLODipine (NORVASC) 10 MG tablet Take 1 tablet (10 mg total) by mouth daily. 90 tablet 3  . atorvastatin (LIPITOR) 40 MG tablet Take 1 tablet (40 mg total) by mouth daily. 90 tablet  1  . busPIRone (BUSPAR) 5 MG tablet Take 1 tablet (5 mg total) by mouth 2 (two) times daily. 180 tablet 1  . FLUoxetine (PROZAC) 20 MG tablet Take 2 tablets (40 mg total) by mouth daily. Delete prior refill. 60 tablet 0  . glipiZIDE (GLUCOTROL) 5 MG tablet Take 1 tablet (5 mg total) by mouth 2 (two) times daily before a meal. 180 tablet 1  . hydrALAZINE (APRESOLINE) 25 MG tablet Take 1 tablet (25 mg total) by mouth 3 (three) times daily. 90 tablet 1  . isosorbide dinitrate (ISORDIL) 30 MG tablet Take 1 tablet (30 mg total) by mouth 3 (three) times daily. 90 tablet 1  . lisinopril-hydrochlorothiazide (ZESTORETIC) 20-25 MG tablet Take 1 tablet by mouth daily. 90 tablet 1  . traZODone (DESYREL) 50 MG tablet Take 1 tablet (50 mg total) by mouth at bedtime. 30 tablet 0   No facility-administered medications prior to visit.    Allergies  Allergen Reactions  . Orange Anaphylaxis and Hives    "My throat starts closing up, my tongue, lips started swelling & burning.  Similar reaction to onions.  Rebecca Spence [Prunus Persica] Hives    Throat closes up, lips swell and burn  . Fentanyl     RN witnessed patient having hives and lip swelling.     ROS Review of Systems  Constitutional: Positive for fatigue.  HENT: Positive for congestion and rhinorrhea.   Allergic/Immunologic: Positive for environmental allergies.  Neurological: Positive for headaches.  All other systems reviewed and are negative.     Objective:    Physical Exam Vitals reviewed.  Constitutional:      Appearance: She is obese.  HENT:     Head: Normocephalic.     Nose: Congestion and rhinorrhea present.  Cardiovascular:     Rate and Rhythm: Normal rate and regular rhythm.     Pulses: Normal pulses.     Heart sounds: Normal heart sounds.  Pulmonary:     Effort: Pulmonary effort is normal.     Breath sounds: Normal breath sounds.  Abdominal:     General: Bowel sounds are normal.  Musculoskeletal:        General: Normal  range of motion.  Skin:    General: Skin is warm and dry.  Neurological:     Mental Status: She is alert and oriented to person, place, and time.  Psychiatric:        Mood and Affect: Mood normal.        Behavior: Behavior  normal.        Thought Content: Thought content normal.        Judgment: Judgment normal.     BP 139/89 (BP Location: Left Arm, Patient Position: Sitting, Cuff Size: Normal)   Pulse 88   Temp (!) 97.5 F (36.4 C) (Temporal)   Ht 5\' 6"  (1.676 m)   Wt 227 lb 3.2 oz (103.1 kg)   LMP 09/22/2019 (Exact Date)   SpO2 96%   BMI 36.67 kg/m  Wt Readings from Last 3 Encounters:  10/07/19 227 lb 3.2 oz (103.1 kg)  08/19/19 (!) 225 lb 6.4 oz (102.2 kg)  02/22/19 236 lb 3.2 oz (107.1 kg)     Health Maintenance Due  Topic Date Due  . Hepatitis C Screening  Never done  . PNEUMOCOCCAL POLYSACCHARIDE VACCINE AGE 74-64 HIGH RISK  Never done  . OPHTHALMOLOGY EXAM  Never done  . INFLUENZA VACCINE  08/22/2019    There are no preventive care reminders to display for this patient.  Lab Results  Component Value Date   TSH 0.988 07/09/2017   Lab Results  Component Value Date   WBC 6.9 01/27/2019   HGB 13.0 01/27/2019   HCT 41.2 01/27/2019   MCV 94.1 01/27/2019   PLT 288 01/27/2019   Lab Results  Component Value Date   NA 136 01/27/2019   K 3.9 01/27/2019   CO2 26 01/27/2019   GLUCOSE 113 (H) 01/27/2019   BUN 8 01/27/2019   CREATININE 1.02 (H) 01/27/2019   BILITOT 0.9 01/27/2019   ALKPHOS 104 01/27/2019   AST 19 01/27/2019   ALT 18 01/27/2019   PROT 8.1 01/27/2019   ALBUMIN 4.5 01/27/2019   CALCIUM 9.4 01/27/2019   ANIONGAP 10 01/27/2019   Lab Results  Component Value Date   CHOL 213 (H) 11/25/2018   Lab Results  Component Value Date   HDL 36 (L) 11/25/2018   Lab Results  Component Value Date   LDLCALC 136 (H) 11/25/2018   Lab Results  Component Value Date   TRIG 227 (H) 11/25/2018   Lab Results  Component Value Date   CHOLHDL 5.9 (H)  11/25/2018   Lab Results  Component Value Date   HGBA1C 6.2 (A) 08/19/2019      Assessment & Plan:  Keniesha was seen today for anxiety and depression.  Diagnoses and all orders for this visit:  Essential hypertension Elevated Bp 139/89 not at blood pressure goal of less than 130/80, low-sodium, DASH diet, medication compliance, 150 minutes of moderate intensity exercise per week. On monotherapy added amlodipine 10mg  daily   Acute nonintractable headache, unspecified headache type Unclear if related to viral infection, allergies also associated symptoms congestion, cough, and fatigue.  Called patient and asked to stay home tomorrow and [facility] keep appointment and advise from there.  Close exposure to COVID-19 virus Son is at home sent from school had to have COVID testing prior to return - results were positive. Seen on her phone. Other son symptomatic sent home from school awaiting COVID results.   No orders of the defined types were placed in this encounter.   Follow-up: Return in about 4 weeks (around 11/04/2019) for Bp follow up /tele.    Kerin Perna, NP

## 2019-10-08 ENCOUNTER — Encounter (INDEPENDENT_AMBULATORY_CARE_PROVIDER_SITE_OTHER): Payer: Self-pay | Admitting: Primary Care

## 2019-10-20 ENCOUNTER — Telehealth (HOSPITAL_COMMUNITY): Payer: Self-pay | Admitting: Psychiatry

## 2019-11-15 MED FILL — FLUoxetine HCL 20 MG TABS: 20 | 30 days supply | Qty: 60 | Fill #0

## 2019-11-15 MED FILL — busPIRone HCL 5 MG TABS: 5 | 30 days supply | Qty: 60 | Fill #1

## 2019-11-15 MED FILL — glipiZIDE 5 MG TABS: 5 | 30 days supply | Qty: 60 | Fill #0

## 2019-11-15 MED FILL — hydrALAZINE HCL 25 MG TABS: 25 | 30 days supply | Qty: 90 | Fill #0

## 2019-11-15 MED FILL — LISINOPRIL-HYDROCHLOROTHIAZ: 20-25 | 30 days supply | Qty: 30 | Fill #0

## 2019-11-15 MED FILL — AMLODIPINE BESYLATE 10 MG T: 10 | 30 days supply | Qty: 30 | Fill #0

## 2019-11-15 MED FILL — ATORVASTATIN CALCIUM 40 MG: 40 | 30 days supply | Qty: 30 | Fill #0

## 2019-11-18 ENCOUNTER — Telehealth (INDEPENDENT_AMBULATORY_CARE_PROVIDER_SITE_OTHER): Payer: Self-pay | Admitting: Primary Care

## 2019-11-18 DIAGNOSIS — J069 Acute upper respiratory infection, unspecified: Secondary | ICD-10-CM

## 2019-11-18 NOTE — Progress Notes (Signed)
Virtual Visit via Telephone Note  I connected with Rebecca Spence on 11/18/19 at  1:30 PM EDT by telephone and verified that I am speaking with the correct person using two identifiers. Location: Patient: At home Provider: Juluis Mire at [facility] family medicine office   I discussed the limitations, risks, security and privacy concerns of performing an evaluation and management service by telephone and the availability of in person appointments. I also discussed with the patient that there may be a patient responsible charge related to this service. The patient expressed understanding and agreed to proceed.   History of Present Illness: Rebecca Spence is a 39 year old female who voices complaints of Monday she started sneezing and was stop up than running nose on Tuesday with shortness of breath and cough.  She denies fever, chills, headache. Past Medical History:  Diagnosis Date   Bilateral ovarian cysts    DM type 2 (diabetes mellitus, type 2) ([place]) 10/01/2016   Eczema 10/29/2016   Nose and hands/forearms   Essential hypertension 2016   Gestational diabetes    09/2016:  prediabetes   Headache(784.0)    Hyperlipidemia 10/01/2016   Prediabetes 10/01/2016   Eye exam with Shirleen Schirmer, M.D. negative for diabetic changes.    Current Outpatient Medications on File Prior to Visit  Medication Sig Dispense Refill   amLODipine (NORVASC) 10 MG tablet Take 1 tablet (10 mg total) by mouth daily. 90 tablet 3   atorvastatin (LIPITOR) 40 MG tablet Take 1 tablet (40 mg total) by mouth daily. 90 tablet 1   busPIRone (BUSPAR) 5 MG tablet Take 1 tablet (5 mg total) by mouth 2 (two) times daily. 180 tablet 1   FLUoxetine (PROZAC) 20 MG tablet Take 2 tablets (40 mg total) by mouth daily. Delete prior refill. 60 tablet 0   glipiZIDE (GLUCOTROL) 5 MG tablet Take 1 tablet (5 mg total) by mouth 2 (two) times daily before a meal. 180 tablet 1   hydrALAZINE (APRESOLINE) 25 MG tablet Take 1  tablet (25 mg total) by mouth 3 (three) times daily. 90 tablet 1   isosorbide dinitrate (ISORDIL) 30 MG tablet Take 1 tablet (30 mg total) by mouth 3 (three) times daily. 90 tablet 1   lisinopril-hydrochlorothiazide (ZESTORETIC) 20-25 MG tablet Take 1 tablet by mouth daily. 90 tablet 1   traZODone (DESYREL) 50 MG tablet Take 1 tablet (50 mg total) by mouth at bedtime. 30 tablet 0   No current facility-administered medications on file prior to visit.   Observations/Objective: Review of Systems  HENT: Positive for congestion.   Respiratory: Positive for cough and shortness of breath.        Sneezing   Assessment and Plan: Diagnoses and all orders for this visit:  Acute upper respiratory infection Treat as a upper respiratory infection And allergies Medication prescribed antihistamine, Flonase and antibiotics Diflucan prophylactically if needed after antibiotics  Follow Up Instructions:    I discussed the assessment and treatment plan with the patient. The patient was provided an opportunity to ask questions and all were answered. The patient agreed with the plan and demonstrated an understanding of the instructions.   The patient was advised to call back or seek an in-person evaluation if the symptoms worsen or if the condition fails to improve as anticipated. I provided 12 minutes of non-face-to-face time during this encounter.   Kerin Perna, NP

## 2019-11-21 MED ORDER — FLUTICASONE PROPIONATE 50 MCG/ACT NA SUSP: 2.0000 | Freq: Every day | NASAL | 6 refills | Status: DC

## 2019-11-21 MED ORDER — FLUCONAZOLE 150 MG PO TABS: 150.0000 mg | ORAL_TABLET | Freq: Once | ORAL | 0 refills | Status: AC

## 2019-11-21 MED ORDER — AZITHROMYCIN 250 MG PO TABS: ORAL_TABLET | ORAL | 0 refills | Status: DC

## 2019-11-21 MED ORDER — LORATADINE 10 MG PO TABS: 10.0000 mg | ORAL_TABLET | Freq: Every day | ORAL | 11 refills | Status: DC

## 2019-11-22 MED FILL — FLUTICASONE PROP 50 MCG SPR: 50 | 30 days supply | Qty: 16 | Fill #0

## 2019-11-22 MED FILL — AZITHROMYCIN 250 MG TABLET: 250 | 5 days supply | Qty: 6 | Fill #0

## 2019-11-22 MED FILL — FLUCONAZOLE 150 MG TABLET: 150 | 1 days supply | Qty: 1 | Fill #0

## 2019-12-23 ENCOUNTER — Ambulatory Visit (INDEPENDENT_AMBULATORY_CARE_PROVIDER_SITE_OTHER): Payer: 59

## 2019-12-23 ENCOUNTER — Other Ambulatory Visit: Payer: Self-pay

## 2019-12-23 DIAGNOSIS — Z23 Encounter for immunization: Secondary | ICD-10-CM

## 2020-02-03 ENCOUNTER — Emergency Department (HOSPITAL_COMMUNITY)
Admission: EM | Admit: 2020-02-03 | Discharge: 2020-02-03 | Disposition: A | Discharge status: Home / Self Care | Payer: HRSA Program | Attending: Emergency Medicine | Admitting: Emergency Medicine

## 2020-02-03 ENCOUNTER — Other Ambulatory Visit: Payer: Self-pay

## 2020-02-03 ENCOUNTER — Encounter (HOSPITAL_COMMUNITY): Payer: Self-pay

## 2020-02-03 DIAGNOSIS — Z7984 Long term (current) use of oral hypoglycemic drugs: Secondary | ICD-10-CM | POA: Insufficient documentation

## 2020-02-03 DIAGNOSIS — I10 Essential (primary) hypertension: Secondary | ICD-10-CM | POA: Insufficient documentation

## 2020-02-03 DIAGNOSIS — F17210 Nicotine dependence, cigarettes, uncomplicated: Secondary | ICD-10-CM | POA: Insufficient documentation

## 2020-02-03 DIAGNOSIS — R059 Cough, unspecified: Secondary | ICD-10-CM | POA: Diagnosis present

## 2020-02-03 DIAGNOSIS — U071 COVID-19: Secondary | ICD-10-CM | POA: Diagnosis not present

## 2020-02-03 DIAGNOSIS — Z79899 Other long term (current) drug therapy: Secondary | ICD-10-CM | POA: Insufficient documentation

## 2020-02-03 DIAGNOSIS — Z20822 Contact with and (suspected) exposure to covid-19: Secondary | ICD-10-CM

## 2020-02-03 DIAGNOSIS — E119 Type 2 diabetes mellitus without complications: Secondary | ICD-10-CM | POA: Diagnosis not present

## 2020-02-03 DIAGNOSIS — B349 Viral infection, unspecified: Secondary | ICD-10-CM

## 2020-02-03 LAB — SARS CORONAVIRUS 2 (TAT 6-24 HRS): SARS Coronavirus 2: POSITIVE — AB

## 2020-02-03 NOTE — ED Provider Notes (Signed)
[facility] DEPT Provider Note   CSN: [id number] Arrival date & time: 02/03/20  9326     History Chief Complaint  Patient presents with  . Generalized Body Aches  . Chills  . Sore Throat  . Abdominal Pain  . Fever  . Covid Exposure    Rebecca Spence is a 40 y.o. female presenting for evaluation of generalized body aches, chills, sore throat, cough, nasal congestion.  Patient states her symptoms began 2 to 3 days ago.  She is not taking anything for them including Tylenol or ibuprofen.  She states she had a COVID exposure about 10 days ago, was tested the following day and results were negative.  She states she had COVID previously, this feels similar to how she felt.  She has a history of high blood pressure, anxiety, depression, takes medication for this.  She denies chest pain, nausea, vomiting, urinary symptoms, abnormal bowel movements.  HPI     Past Medical History:  Diagnosis Date  . Bilateral ovarian cysts   . DM type 2 (diabetes mellitus, type 2) ([place]) 10/01/2016  . Eczema 10/29/2016   Nose and hands/forearms  . Essential hypertension 2016  . Gestational diabetes    09/2016:  prediabetes  . Headache(784.0)   . Hyperlipidemia 10/01/2016  . Prediabetes 10/01/2016   Eye exam with Shirleen Schirmer, M.D. negative for diabetic changes.    Patient Active Problem List   Diagnosis Date Noted  . Eczema 10/29/2016  . DM type 2 (diabetes mellitus, type 2) ([place]) 10/01/2016  . Hyperlipidemia 10/01/2016  . Essential hypertension 01/21/2014    Past Surgical History:  Procedure Laterality Date  . NO PAST SURGERIES       OB History    Gravida  3   Para  2   Term  2   Preterm  0   AB      Living  2     SAB      IAB      Ectopic      Multiple      Live Births  2           Family History  Problem Relation Age of Onset  . Diabetes Mother   . Hypertension Mother   . Diabetes Maternal Grandmother   . Hyperlipidemia  Maternal Grandmother   . Cancer Sister        cervical  . Asthma Son   . Allergies Son   . Eczema Son   . Asthma Son   . Allergies Son   . Eczema Son     Social History   Tobacco Use  . Smoking status: Current Every Day Smoker    Packs/day: 0.15    Years: 13.00    Pack years: 1.95    Types: Cigarettes  . Smokeless tobacco: Never Used  . Tobacco comment: no time to consider--bums off others.  Vaping Use  . Vaping Use: Never used  Substance Use Topics  . Alcohol use: Yes    Comment: socially-not with pregnancy  . Drug use: Yes    Types: Marijuana    Home Medications Prior to Admission medications   Medication Sig Start Date End Date Taking? Authorizing Provider  amLODipine (NORVASC) 10 MG tablet Take 1 tablet (10 mg total) by mouth daily. 08/19/19   Kerin Perna, NP  atorvastatin (LIPITOR) 40 MG tablet Take 1 tablet (40 mg total) by mouth daily. 08/19/19   Kerin Perna, NP  azithromycin [facility]  Z-PAK) 250 MG tablet Take 2 tablets the first day than take 1 tablet daily until gone 11/21/19   Kerin Perna, NP  busPIRone (BUSPAR) 5 MG tablet Take 1 tablet (5 mg total) by mouth 2 (two) times daily. 08/19/19   Kerin Perna, NP  FLUoxetine (PROZAC) 20 MG tablet Take 2 tablets (40 mg total) by mouth daily. Delete prior refill. 10/04/19   Merian Capron, MD  fluticasone (FLONASE) 50 MCG/ACT nasal spray Place 2 sprays into both nostrils daily. 11/21/19   Kerin Perna, NP  glipiZIDE (GLUCOTROL) 5 MG tablet Take 1 tablet (5 mg total) by mouth 2 (two) times daily before a meal. 08/19/19   Kerin Perna, NP  hydrALAZINE (APRESOLINE) 25 MG tablet Take 1 tablet (25 mg total) by mouth 3 (three) times daily. 08/19/19   Kerin Perna, NP  isosorbide dinitrate (ISORDIL) 30 MG tablet Take 1 tablet (30 mg total) by mouth 3 (three) times daily. 08/19/19   Kerin Perna, NP  lisinopril-hydrochlorothiazide (ZESTORETIC) 20-25 MG tablet Take 1 tablet by  mouth daily. 08/19/19   Kerin Perna, NP  loratadine (CLARITIN) 10 MG tablet Take 1 tablet (10 mg total) by mouth daily. 11/21/19   Kerin Perna, NP  traZODone (DESYREL) 50 MG tablet Take 1 tablet (50 mg total) by mouth at bedtime. 10/04/19   Merian Capron, MD    Allergies    Orange, Peach [prunus persica], and Fentanyl  Review of Systems   Review of Systems  Constitutional: Positive for chills and fever.  HENT: Positive for congestion and sore throat.   Respiratory: Positive for cough and shortness of breath.   Musculoskeletal: Positive for myalgias.  Neurological: Positive for headaches.  All other systems reviewed and are negative.   Physical Exam Updated Vital Signs BP (!) 137/91 (BP Location: Left Arm)   Pulse 99   Temp 100.1 F (37.8 C) (Oral)   Resp 18   Ht 5' 6.5" (1.689 m)   Wt 90.7 kg   LMP 01/27/2020   SpO2 93%   BMI 31.80 kg/m   Physical Exam Vitals and nursing note reviewed.  Constitutional:      General: She is not in acute distress.    Appearance: She is well-developed and well-nourished. She is obese.     Comments: Resting in the bed in no acute distress  HENT:     Head: Normocephalic and atraumatic.  Eyes:     Extraocular Movements: EOM normal.     Conjunctiva/sclera: Conjunctivae normal.     Pupils: Pupils are equal, round, and reactive to light.  Cardiovascular:     Rate and Rhythm: Normal rate and regular rhythm.     Pulses: Normal pulses and intact distal pulses.  Pulmonary:     Effort: Pulmonary effort is normal. No respiratory distress.     Breath sounds: Normal breath sounds. No wheezing.     Comments: Speaking in full sentences.  Clear lung sounds in all fields.  Sats stable on room air. Abdominal:     General: There is no distension.     Palpations: Abdomen is soft. There is no mass.     Tenderness: There is no abdominal tenderness. There is no guarding or rebound.  Musculoskeletal:        General: Normal range of motion.      Cervical back: Normal range of motion and neck supple.  Skin:    General: Skin is warm and dry.     Capillary  Refill: Capillary refill takes less than 2 seconds.  Neurological:     Mental Status: She is alert and oriented to person, place, and time.  Psychiatric:        Mood and Affect: Mood and affect normal.     ED Results / Procedures / Treatments   Labs (all labs ordered are listed, but only abnormal results are displayed) Labs Reviewed  SARS CORONAVIRUS 2 (TAT 6-24 HRS)    EKG None  Radiology No results found.  Procedures Procedures (including critical care time)  Medications Ordered in ED Medications - No data to display  ED Course  I have reviewed the triage vital signs and the nursing notes.  Pertinent labs & imaging results that were available during my care of the patient were reviewed by me and considered in my medical decision making (see chart for details).    MDM Rules/Calculators/A&P                          Patient presenting with 1-2 day h/o URI symptoms.  Physical exam reassuring, patient is afebrile and appears nontoxic.  Pulmonary exam reassuring.  Doubt pneumonia, strep, other bacterial infection, or peritonsillar abscess.  Likely viral illness. Covid test pending.  Will treat symptomatically.  Patient to follow-up with primary care as needed.  At this time, patient appears safe for discharge.  Return precautions given.  Patient states she understands and agrees to plan.  Juliane Aaker was evaluated in Emergency Department on 02/03/2020 for the symptoms described in the history of present illness. She was evaluated in the context of the global COVID-19 pandemic, which necessitated consideration that the patient might be at risk for infection with the SARS-CoV-2 virus that causes COVID-19. Institutional protocols and algorithms that pertain to the evaluation of patients at risk for COVID-19 are in a state of rapid change based on information  released by regulatory bodies including the CDC and federal and state organizations. These policies and algorithms were followed during the patient's care in the ED.  Final Clinical Impression(s) / ED Diagnoses Final diagnoses:  Suspected COVID-19 virus infection  Close exposure to COVID-19 virus  Viral illness    Rx / DC Orders ED Discharge Orders    None       Franchot Heidelberg, PA-C 02/03/20 1246    Fredia Sorrow, MD 02/10/20 1743

## 2020-02-03 NOTE — Discharge Instructions (Signed)
You were tested for Covid.  Results should return in 6 to 24 hours.  You will receive a phone call if it is positive.  You will not hear anything if it is negative.  Either way, you may check online on MyChart. Regardless, this is likely a viral illness, which should be treated symptomatically.  Use Tylenol or ibuprofen as needed for fevers, headaches, or body aches.  Use cough syrup as needed.  Make sure you stay well-hydrated with water.  Wash your hands frequently to prevent spread of infection.   If your test is positive, you will need to quarantine for a total of 5 days from symptom onset.  You may end quarantine if you are fever free and your symptoms are improving, however it is extremely important that you wear a mask for an additional 5 days at all times. If you are not fever free your symptoms are not improving, you will need to quarantine until this is the case.  Follow up with your primary care doctor as needed for further evaluation.   Return to the emergency room if you develop chest pain, difficulty breathing, or any new or worsening symptoms.

## 2020-02-03 NOTE — ED Triage Notes (Signed)
Patient states she had Covid exposure and tested the next day and was negative(rapid-11 days ago.) Patient c/o fever (100.1), generalized body aches, sore throat , nasal congestion x 2-3 days ago.

## 2020-04-22 ENCOUNTER — Other Ambulatory Visit: Payer: Self-pay

## 2020-07-02 ENCOUNTER — Emergency Department (HOSPITAL_COMMUNITY): Payer: Self-pay

## 2020-07-02 ENCOUNTER — Other Ambulatory Visit: Payer: Self-pay

## 2020-07-02 ENCOUNTER — Emergency Department (HOSPITAL_COMMUNITY)
Admission: EM | Admit: 2020-07-02 | Discharge: 2020-07-02 | Disposition: A | Discharge status: Home / Self Care | Payer: Self-pay | Attending: Emergency Medicine | Admitting: Emergency Medicine

## 2020-07-02 ENCOUNTER — Emergency Department (HOSPITAL_BASED_OUTPATIENT_CLINIC_OR_DEPARTMENT_OTHER): Payer: Self-pay

## 2020-07-02 ENCOUNTER — Encounter (HOSPITAL_COMMUNITY): Payer: Self-pay | Admitting: Emergency Medicine

## 2020-07-02 DIAGNOSIS — M25571 Pain in right ankle and joints of right foot: Secondary | ICD-10-CM | POA: Insufficient documentation

## 2020-07-02 DIAGNOSIS — R059 Cough, unspecified: Secondary | ICD-10-CM | POA: Insufficient documentation

## 2020-07-02 DIAGNOSIS — F17210 Nicotine dependence, cigarettes, uncomplicated: Secondary | ICD-10-CM | POA: Insufficient documentation

## 2020-07-02 DIAGNOSIS — M79661 Pain in right lower leg: Secondary | ICD-10-CM | POA: Insufficient documentation

## 2020-07-02 DIAGNOSIS — I10 Essential (primary) hypertension: Secondary | ICD-10-CM | POA: Insufficient documentation

## 2020-07-02 DIAGNOSIS — Z7984 Long term (current) use of oral hypoglycemic drugs: Secondary | ICD-10-CM | POA: Insufficient documentation

## 2020-07-02 DIAGNOSIS — R600 Localized edema: Secondary | ICD-10-CM | POA: Insufficient documentation

## 2020-07-02 DIAGNOSIS — Z79899 Other long term (current) drug therapy: Secondary | ICD-10-CM | POA: Insufficient documentation

## 2020-07-02 DIAGNOSIS — E119 Type 2 diabetes mellitus without complications: Secondary | ICD-10-CM | POA: Insufficient documentation

## 2020-07-02 MED ORDER — ACETAMINOPHEN 325 MG PO TABS
650.0000 mg | ORAL_TABLET | Freq: Once | ORAL | Status: AC
  Administered 2020-07-02: 650 mg via ORAL
  Filled 2020-07-02: qty 2

## 2020-07-02 NOTE — Progress Notes (Signed)
Orthopedic Tech Progress Note Patient Details:  Rebecca Spence 20-Mar-1980 [id number]  Ortho Devices Type of Ortho Device: CAM walker Ortho Device/Splint Location: RLE Ortho Device/Splint Interventions: Ordered, Application   Post Interventions Patient Tolerated: Well Instructions Provided: Adjustment of device  [name] 07/02/2020, 1:44 PM

## 2020-07-02 NOTE — Discharge Instructions (Addendum)
I recommend a combination of tylenol and ibuprofen for management of your pain. You can take a low dose of both at the same time. I recommend 500 mg of Tylenol combined with 600 mg of ibuprofen. This is one maximum strength Tylenol and three regular ibuprofen. You can take these 2-3 times for day for your pain. Please try to take these medications with a small amount of food as well to prevent upsetting your stomach.  Also, please consider topical pain relieving creams such as Voltaran Gel, BioFreeze, or Icy Hot. There is also a pain relieving cream made by [employer]. You should be able to find all of these at your local pharmacy.   Below is the contact information for Dr. Percell Miller.  He is a local orthopedic doctor.  Please call him in 1 week if you find that your symptoms have not improved.  If your symptoms worsen, please come back to the emergency department.  It was a pleasure to meet you.

## 2020-07-02 NOTE — ED Provider Notes (Signed)
[facility] DEPT Provider Note   CSN: [id number] Arrival date & time: 07/02/20  1018     History Chief Complaint  Patient presents with   Ankle Pain    Rebecca Spence is a 40 y.o. female.  HPI  Patient is a 40 year old female with a medical history as noted below.  She presents to the emergency department with multiple complaints.  She states that she started a new job about 3 weeks ago where she has to stand throughout the day.  Over the past 3 days she began experiencing atraumatic right ankle pain.  Symptoms began worsening and radiating up through the right calf into the right lower leg.  Reports mild swelling in the region.  No numbness or weakness.  No history of blood clots.  Patient also notes a productive cough that started a few days ago.  She has been vaccinated for COVID-19 x3.  She reports associated rhinorrhea.  No sore throat, fevers, nausea, vomiting, diarrhea, chest pain, shortness of breath, or abdominal pain.  No hemoptysis.  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     Past Medical History:  Diagnosis Date   Bilateral ovarian cysts    DM type 2 (diabetes mellitus, type 2) ([place]) 10/01/2016   Eczema 10/29/2016   Nose and hands/forearms   Essential hypertension 2016   Gestational diabetes    09/2016:  prediabetes   Headache(784.0)    Hyperlipidemia 10/01/2016   Prediabetes 10/01/2016   Eye exam with Shirleen Schirmer, M.D. negative for diabetic changes.    Patient Active Problem List   Diagnosis Date Noted   Eczema 10/29/2016   DM type 2 (diabetes mellitus, type 2) ([place]) 10/01/2016   Hyperlipidemia 10/01/2016   Essential hypertension 01/21/2014    Past Surgical History:  Procedure Laterality Date   NO PAST SURGERIES       OB History     Gravida  3   Para  2   Term  2   Preterm  0   AB      Living  2      SAB       IAB      Ectopic      Multiple      Live Births  2           Family History  Problem Relation Age of Onset   Diabetes Mother    Hypertension Mother    Diabetes Maternal Grandmother    Hyperlipidemia Maternal Grandmother    Cancer Sister        cervical   Asthma Son    Allergies Son    Eczema Son    Asthma Son    Allergies Son    Eczema Son     Social History   Tobacco Use   Smoking status: Every Day    Packs/day: 0.15    Years: 13.00    Pack years: 1.95    Types: Cigarettes   Smokeless tobacco: Never   Tobacco comments:    no time to consider--bums off others.  Vaping Use   Vaping Use: Never used  Substance Use Topics   Alcohol use: Yes    Comment: socially-not with pregnancy   Drug use: Yes    Types: Marijuana    Home Medications Prior to Admission medications   Medication Sig Start Date End Date Taking? Authorizing Provider  amLODipine (NORVASC) 10 MG tablet TAKE 1 TABLET (10 MG TOTAL) BY MOUTH DAILY. 08/19/19 08/18/20  Kerin Perna, NP  atorvastatin (LIPITOR) 40 MG tablet TAKE 1 TABLET (40 MG TOTAL) BY MOUTH DAILY. 08/19/19 08/18/20  Kerin Perna, NP  azithromycin (ZITHROMAX Z-PAK) 250 MG tablet Take 2 tablets the first day than take 1 tablet daily until gone 11/21/19   Edwards, Milford Cage, NP  busPIRone (BUSPAR) 5 MG tablet TAKE 1 TABLET (5 MG TOTAL) BY MOUTH 2 (TWO) TIMES DAILY. 08/19/19 08/18/20  Kerin Perna, NP  FLUoxetine (PROZAC) 20 MG tablet Take 2 tablets (40 mg total) by mouth daily. Delete prior refill. 10/04/19   Merian Capron, MD  FLUoxetine (PROZAC) 20 MG tablet TAKE 2 TABLETS (40 MG TOTAL) BY MOUTH DAILY. 08/19/19 08/18/20  Kerin Perna, NP  fluticasone (FLONASE) 50 MCG/ACT nasal spray Place 2 sprays into both nostrils daily. 11/21/19   Kerin Perna, NP  glipiZIDE (GLUCOTROL) 5 MG tablet TAKE 1 TABLET (5 MG TOTAL) BY MOUTH 2 (TWO) TIMES DAILY BEFORE A MEAL. 08/19/19 08/18/20  Kerin Perna, NP  hydrALAZINE  (APRESOLINE) 25 MG tablet TAKE 1 TABLET (25 MG TOTAL) BY MOUTH 3 (THREE) TIMES DAILY. 08/19/19 08/18/20  Kerin Perna, NP  isosorbide dinitrate (ISORDIL) 30 MG tablet Take 1 tablet (30 mg total) by mouth 3 (three) times daily. 08/19/19   Kerin Perna, NP  lisinopril-hydrochlorothiazide (ZESTORETIC) 20-25 MG tablet TAKE 1 TABLET BY MOUTH DAILY. 08/19/19 08/18/20  Kerin Perna, NP  loratadine (CLARITIN) 10 MG tablet Take 1 tablet (10 mg total) by mouth daily. 11/21/19   Kerin Perna, NP  traZODone (DESYREL) 50 MG tablet Take 1 tablet (50 mg total) by mouth at bedtime. 10/04/19   Merian Capron, MD    Allergies    Orange, Peach [prunus persica], and Fentanyl  Review of Systems   Review of Systems  Constitutional:  Negative for chills and fever.  Respiratory:  Positive for cough. Negative for shortness of breath.   Cardiovascular:  Positive for leg swelling. Negative for chest pain.  Gastrointestinal:  Negative for abdominal pain, diarrhea, nausea and vomiting.  Musculoskeletal:  Positive for arthralgias, joint swelling and myalgias.   Physical Exam Updated Vital Signs BP (!) 177/116 (BP Location: Left Arm)   Pulse 92   Temp 98.4 F (36.9 C) (Oral)   Resp 18   LMP 06/11/2020 (Approximate)   SpO2 95%   Physical Exam Vitals and nursing note reviewed.  Constitutional:      General: She is not in acute distress.    Appearance: Normal appearance. She is not ill-appearing, toxic-appearing or diaphoretic.  HENT:     Head: Normocephalic and atraumatic.     Right Ear: External ear normal.     Left Ear: External ear normal.     Nose: Nose normal.     Mouth/Throat:     Mouth: Mucous membranes are moist.     Pharynx: Oropharynx is clear. No oropharyngeal exudate or posterior oropharyngeal erythema.  Eyes:     General: No scleral icterus.       Right eye: No discharge.        Left eye: No discharge.     Extraocular Movements: Extraocular movements intact.      Conjunctiva/sclera: Conjunctivae normal.  Cardiovascular:     Rate and Rhythm: Normal rate and regular rhythm.     Pulses: Normal pulses.     Heart sounds: Normal heart sounds. No murmur heard.  No friction rub. No gallop.     Comments: Regular rate and rhythm without murmurs, rubs, or gallops. Pulmonary:     Effort: Pulmonary effort is normal. No respiratory distress.     Breath sounds: Normal breath sounds. No stridor. No wheezing, rhonchi or rales.     Comments: Lungs are clear to auscultation bilaterally.  No wheezing, rales, or rhonchi. Abdominal:     General: Abdomen is flat.     Tenderness: There is no abdominal tenderness.  Musculoskeletal:        General: Swelling and tenderness present. Normal range of motion.     Cervical back: Normal range of motion and neck supple. No tenderness.     Comments: Moderate TTP noted to the dorsum of the right ankle.  No overlying skin changes.  No visible joint effusion.  Additional moderate TTP noted overlying the right calf.  Trace nonpitting edema noted in the right lower leg compared to the right.  Skin:    General: Skin is warm and dry.  Neurological:     General: No focal deficit present.     Mental Status: She is alert and oriented to person, place, and time.  Psychiatric:        Mood and Affect: Mood normal.        Behavior: Behavior normal.    ED Results / Procedures / Treatments   Labs (all labs ordered are listed, but only abnormal results are displayed) Labs Reviewed - No data to display  EKG None  Radiology DG Ankle Complete Right  Result Date: 07/02/2020 CLINICAL DATA:  Medial right ankle pain.  No injury. EXAM: RIGHT ANKLE - COMPLETE 3+ VIEW COMPARISON:  None. FINDINGS: There is no evidence of fracture, dislocation, or joint effusion. There is no evidence of arthropathy or other focal bone abnormality. Soft tissues are unremarkable. IMPRESSION: Negative. Electronically Signed   By: Titus Dubin M.D.   On: 07/02/2020  12:54   DG Chest Portable 1 View  Result Date: 07/02/2020 CLINICAL DATA:  Cough EXAM: PORTABLE CHEST 1 VIEW COMPARISON:  Jun 03, 2016 FINDINGS: The cardiomediastinal silhouette is normal in contour. No pleural effusion. No pneumothorax. No acute pleuroparenchymal abnormality. Visualized abdomen is unremarkable. No acute osseous abnormality noted. IMPRESSION: No acute cardiopulmonary abnormality. Electronically Signed   By: Valentino Saxon MD   On: 07/02/2020 12:53   VAS [place] LOWER EXTREMITY VENOUS (DVT) (ONLY [facility] & [facility])  Result Date: 07/02/2020  Lower Venous DVT Study Patient Name:  Rebecca Spence  Date of Exam:   07/02/2020 Medical Rec #: [id number]            Accession #:    [id number] Date of Birth: [birth year]            Patient Gender: F Patient Age:   79Y Exam Location:  [facility] Procedure:      VAS [place] LOWER EXTREMITY VENOUS (DVT) Referring Phys: [id number] Rayna Sexton --------------------------------------------------------------------------------  Indications: Acute pain to RT ankle/calf.  Limitations: Patient intolerance to probe pressure at some levels, artifact on pulse wave doppler secondary to patient trembling. Comparison Study: No prior studies. Performing Technologist: Darlin Coco RDMS,RVT  Examination Guidelines: A complete evaluation includes B-mode imaging, spectral Doppler, color Doppler, and power Doppler as needed of all accessible portions of each vessel. Bilateral testing is considered an integral part of a complete examination. Limited examinations for reoccurring indications may be performed as noted. The reflux portion of the exam is performed with the patient in reverse  Trendelenburg.  +---------+---------------+---------+-----------+----------+-------------------+ RIGHT    CompressibilityPhasicitySpontaneityPropertiesThrombus Aging      +---------+---------------+---------+-----------+----------+-------------------+ CFV      Full           Yes      Yes                                       +---------+---------------+---------+-----------+----------+-------------------+ SFJ      Full                                                             +---------+---------------+---------+-----------+----------+-------------------+ FV Prox  Full                                                             +---------+---------------+---------+-----------+----------+-------------------+ FV Mid   Full                                                             +---------+---------------+---------+-----------+----------+-------------------+ FV DistalFull                                                             +---------+---------------+---------+-----------+----------+-------------------+ PFV      Full                                                             +---------+---------------+---------+-----------+----------+-------------------+ POP      Full                                                             +---------+---------------+---------+-----------+----------+-------------------+ PTV      Full                                                             +---------+---------------+---------+-----------+----------+-------------------+ PERO     Full                                         Some segments not  well visualized     +---------+---------------+---------+-----------+----------+-------------------+ Gastroc  Full                                                             +---------+---------------+---------+-----------+----------+-------------------+   +----+---------------+---------+-----------+----------+--------------+ LEFTCompressibilityPhasicitySpontaneityPropertiesThrombus Aging +----+---------------+---------+-----------+----------+--------------+ CFV Full           Yes      Yes                                  +----+---------------+---------+-----------+----------+--------------+     Summary: RIGHT: - There is no evidence of deep vein thrombosis in the lower extremity.  - No cystic structure found in the popliteal fossa.  LEFT: - No evidence of common femoral vein obstruction.  *See table(s) above for measurements and observations.    Preliminary     Procedures Procedures   Medications Ordered in ED Medications  acetaminophen (TYLENOL) tablet 650 mg (650 mg Oral Given 07/02/20 1108)    ED Course  I have reviewed the triage vital signs and the nursing notes.  Pertinent labs & imaging results that were available during my care of the patient were reviewed by me and considered in my medical decision making (see chart for details).    MDM Rules/Calculators/A&P                          Pt is a 40 y.o. female who presents to the emergency department due to cough as well as radiating right ankle and calf pain.  Imaging: Ultrasound of the right lower extremity shows no evidence of deep vein thrombosis in the lower extremity.  No cystic structure found in the popliteal fossa.  The left leg shows no evidence of common femoral vein obstruction. X-ray of the chest is negative. X-ray of the right ankle is negative.  I, Rayna Sexton, PA-C, personally reviewed and evaluated these images and lab results as part of my medical decision-making.  Unsure the source of the patient's pain in the lower extremity.  No evidence of DVT on ultrasound.  X-ray is normal.  Possibly an overuse injury.  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  She was given a cam boot as well as crutches for comfort.  Recommended Tylenol/ibuprofen for management of her pain.  We discussed dosing.  She was also given a referral to orthopedics if she finds that her symptoms are refractory.  Feel the patient is stable for discharge at this time and she  is agreeable.  Her questions were answered and she was amicable at the time of discharge.  Note: Portions of this report may have been transcribed using voice recognition software. Every effort was made to ensure accuracy; however, inadvertent computerized transcription errors may be present.   Final Clinical Impression(s) / ED Diagnoses Final diagnoses:  Acute right ankle pain   Rx / DC Orders ED Discharge Orders     None        Rayna Sexton, PA-C 07/02/20 1750    Fredia Sorrow, MD 07/05/20 [phone]

## 2020-07-02 NOTE — Progress Notes (Signed)
Lower extremity venous RT study completed.  Preliminary results relayed to [place], [place].   See CV Proc for preliminary results report.   Darlin Coco, RDMS, RVT

## 2020-07-02 NOTE — ED Triage Notes (Signed)
Patient c/o right ankle pain radiating to right knee. Denies injury but reports working on feet for ten days in a row.

## 2020-10-16 ENCOUNTER — Other Ambulatory Visit (INDEPENDENT_AMBULATORY_CARE_PROVIDER_SITE_OTHER): Payer: Self-pay | Admitting: Primary Care

## 2020-10-18 ENCOUNTER — Other Ambulatory Visit: Payer: Self-pay

## 2020-11-22 ENCOUNTER — Encounter (HOSPITAL_COMMUNITY): Payer: Self-pay | Admitting: *Deleted

## 2020-11-22 ENCOUNTER — Other Ambulatory Visit: Payer: Self-pay

## 2020-11-22 ENCOUNTER — Emergency Department (HOSPITAL_COMMUNITY)
Admission: EM | Admit: 2020-11-22 | Discharge: 2020-11-22 | Disposition: A | Discharge status: Home / Self Care | Payer: Managed Care, Other (non HMO) | Attending: Physician Assistant | Admitting: Physician Assistant

## 2020-11-22 DIAGNOSIS — R5381 Other malaise: Secondary | ICD-10-CM | POA: Diagnosis not present

## 2020-11-22 DIAGNOSIS — J101 Influenza due to other identified influenza virus with other respiratory manifestations: Secondary | ICD-10-CM | POA: Insufficient documentation

## 2020-11-22 DIAGNOSIS — J069 Acute upper respiratory infection, unspecified: Secondary | ICD-10-CM | POA: Insufficient documentation

## 2020-11-22 DIAGNOSIS — Z5321 Procedure and treatment not carried out due to patient leaving prior to being seen by health care provider: Secondary | ICD-10-CM | POA: Insufficient documentation

## 2020-11-22 DIAGNOSIS — R519 Headache, unspecified: Secondary | ICD-10-CM | POA: Insufficient documentation

## 2020-11-22 DIAGNOSIS — M791 Myalgia, unspecified site: Secondary | ICD-10-CM | POA: Insufficient documentation

## 2020-11-22 DIAGNOSIS — J111 Influenza due to unidentified influenza virus with other respiratory manifestations: Secondary | ICD-10-CM

## 2020-11-22 DIAGNOSIS — R059 Cough, unspecified: Secondary | ICD-10-CM | POA: Insufficient documentation

## 2020-11-22 HISTORY — DX: Bipolar disorder, current episode mixed, unspecified: F31.60

## 2020-11-22 HISTORY — DX: Schizophrenia, unspecified: F20.9

## 2020-11-22 HISTORY — DX: Anxiety disorder, unspecified: F41.9

## 2020-11-22 LAB — CBC WITH DIFFERENTIAL/PLATELET
Abs Immature Granulocytes: 0.04 10*3/uL (ref 0.00–0.07)
Basophils Absolute: 0 10*3/uL (ref 0.0–0.1)
Basophils Relative: 0 %
Eosinophils Absolute: 0 10*3/uL (ref 0.0–0.5)
Eosinophils Relative: 0 %
HCT: 38.9 % (ref 36.0–46.0)
Hemoglobin: 12.7 g/dL (ref 12.0–15.0)
Immature Granulocytes: 1 %
Lymphocytes Relative: 9 %
Lymphs Abs: 0.8 10*3/uL (ref 0.7–4.0)
MCH: 29.6 pg (ref 26.0–34.0)
MCHC: 32.6 g/dL (ref 30.0–36.0)
MCV: 90.7 fL (ref 80.0–100.0)
Monocytes Absolute: 1 10*3/uL (ref 0.1–1.0)
Monocytes Relative: 13 %
Neutro Abs: 6.2 10*3/uL (ref 1.7–7.7)
Neutrophils Relative %: 77 %
Platelets: 287 10*3/uL (ref 150–400)
RBC: 4.29 MIL/uL (ref 3.87–5.11)
RDW: 13.9 % (ref 11.5–15.5)
WBC: 8.1 10*3/uL (ref 4.0–10.5)
nRBC: 0 % (ref 0.0–0.2)

## 2020-11-22 LAB — COMPREHENSIVE METABOLIC PANEL
ALT: 13 U/L (ref 0–44)
AST: 19 U/L (ref 15–41)
Albumin: 4.2 g/dL (ref 3.5–5.0)
Alkaline Phosphatase: 82 U/L (ref 38–126)
Anion gap: 11 (ref 5–15)
BUN: 6 mg/dL (ref 6–20)
CO2: 23 mmol/L (ref 22–32)
Calcium: 9.4 mg/dL (ref 8.9–10.3)
Chloride: 101 mmol/L (ref 98–111)
Creatinine, Ser: 0.96 mg/dL (ref 0.44–1.00)
GFR, Estimated: >60 mL/min (ref >60)
Glucose, Bld: 119 mg/dL — ABNORMAL HIGH (ref 70–99)
Potassium: 3.6 mmol/L (ref 3.5–5.1)
Sodium: 135 mmol/L (ref 135–145)
Total Bilirubin: 0.9 mg/dL (ref 0.3–1.2)
Total Protein: 7.8 g/dL (ref 6.5–8.1)

## 2020-11-22 LAB — TROPONIN I (HIGH SENSITIVITY): Troponin I (High Sensitivity): 4 ng/L (ref <18)

## 2020-11-22 LAB — LIPASE, BLOOD: Lipase: 22 U/L (ref 11–51)

## 2020-11-22 LAB — I-STAT BETA HCG BLOOD, ED (MC, WL, AP ONLY): I-stat hCG, quantitative: <5 m[IU]/mL (ref <5)

## 2020-11-22 MED ORDER — ACETAMINOPHEN 500 MG PO TABS
1000.0000 mg | ORAL_TABLET | Freq: Once | ORAL | Status: AC
  Administered 2020-11-22: 1000 mg via ORAL
  Filled 2020-11-22: qty 2

## 2020-11-22 NOTE — ED Triage Notes (Signed)
Pt's child was just dx with Flu last night. She is experiencing body aches, cough, headache, general malaise.

## 2020-11-22 NOTE — ED Notes (Signed)
No answer for room x3

## 2020-11-22 NOTE — ED Provider Notes (Signed)
Emergency Medicine Provider Triage Evaluation Note  Rebecca Spence , a 40 y.o. female  was evaluated in triage.  Pt complains of feeling unwell.  She reports that her child was diagnosed with flu by swab.  She didn't get a flu shot.  She hasn't taken any OTC meds PTA. She reports fevers, cough, body aches.  She reports back pain.    Review of Systems  Positive: Flu exposure, headache, body aches, fevers. cough Negative: Syncope, abdominal pain, numbness, dysuria  Physical Exam  BP (!) 171/102 (BP Location: Right Arm)   Pulse (!) 106   Temp (!) 100.7 F (38.2 C)   Resp 18   Ht 5' 6.5" (1.689 m)   Wt 90.7 kg   SpO2 100%   BMI 31.80 kg/m  Gen:   Awake, appears uncomfortable  Resp:  Normal effort  MSK:   Moves extremities without difficulty  Other:  2+ DP pulses bilaterally.  Normal gait.   Medical Decision Making  Medically screening exam initiated at 6:22 PM.  Appropriate orders placed.  Rebecca Spence was informed that the remainder of the evaluation will be completed by another provider, this initial triage assessment does not replace that evaluation, and the importance of remaining in the ED until their evaluation is complete.  She declines flu swab and given that she has any known exposure I suspect she has flu, however based on the degree of her pain and her comorbid conditions will obtain additional lab work especially as she is hypertensive.  She has intact distal pulses.    Lorin Glass, PA-C 11/22/20 1836    Hayden Rasmussen, MD 11/23/20 1031

## 2020-11-23 ENCOUNTER — Telehealth: Payer: Managed Care, Other (non HMO) | Admitting: Physician Assistant

## 2020-11-23 DIAGNOSIS — J111 Influenza due to unidentified influenza virus with other respiratory manifestations: Secondary | ICD-10-CM

## 2020-11-23 MED ORDER — PSEUDOEPH-BROMPHEN-DM 30-2-10 MG/5ML PO SYRP: 5.0000 mL | ORAL_SOLUTION | Freq: Four times a day (QID) | ORAL | 0 refills | Status: DC | PRN

## 2020-11-23 MED ORDER — ALBUTEROL SULFATE HFA 108 (90 BASE) MCG/ACT IN AERS: 2.0000 | INHALATION_SPRAY | Freq: Four times a day (QID) | RESPIRATORY_TRACT | 0 refills | Status: DC | PRN

## 2020-11-23 MED ORDER — BENZONATATE 100 MG PO CAPS: 100.0000 mg | ORAL_CAPSULE | Freq: Three times a day (TID) | ORAL | 0 refills | Status: DC | PRN

## 2020-11-23 NOTE — Patient Instructions (Signed)
Rebecca Spence, thank you for joining Mar Daring, PA-C for today's virtual visit.  While this provider is not your primary care provider (PCP), if your PCP is located in our provider database this encounter information will be shared with them immediately following your visit.  Consent: (Patient) Rebecca Spence provided verbal consent for this virtual visit at the beginning of the encounter.  Current Medications:  Current Outpatient Medications:    albuterol (VENTOLIN HFA) 108 (90 Base) MCG/ACT inhaler, Inhale 2 puffs into the lungs every 6 (six) hours as needed for wheezing or shortness of breath., Disp: 8 g, Rfl: 0   benzonatate (TESSALON) 100 MG capsule, Take 1 capsule (100 mg total) by mouth 3 (three) times daily as needed., Disp: 30 capsule, Rfl: 0   brompheniramine-pseudoephedrine-DM 30-2-10 MG/5ML syrup, Take 5 mLs by mouth 4 (four) times daily as needed., Disp: 120 mL, Rfl: 0   amLODipine (NORVASC) 10 MG tablet, TAKE 1 TABLET (10 MG TOTAL) BY MOUTH DAILY., Disp: 90 tablet, Rfl: 3   atorvastatin (LIPITOR) 40 MG tablet, TAKE 1 TABLET (40 MG TOTAL) BY MOUTH DAILY., Disp: 90 tablet, Rfl: 1   azithromycin (ZITHROMAX Z-PAK) 250 MG tablet, Take 2 tablets the first day than take 1 tablet daily until gone, Disp: 6 each, Rfl: 0   FLUoxetine (PROZAC) 20 MG tablet, Take 2 tablets (40 mg total) by mouth daily. Delete prior refill., Disp: 60 tablet, Rfl: 0   FLUoxetine (PROZAC) 20 MG tablet, TAKE 2 TABLETS (40 MG TOTAL) BY MOUTH DAILY., Disp: 180 tablet, Rfl: 1   fluticasone (FLONASE) 50 MCG/ACT nasal spray, Place 2 sprays into both nostrils daily., Disp: 16 g, Rfl: 6   glipiZIDE (GLUCOTROL) 5 MG tablet, TAKE 1 TABLET (5 MG TOTAL) BY MOUTH 2 (TWO) TIMES DAILY BEFORE A MEAL., Disp: 180 tablet, Rfl: 1   hydrALAZINE (APRESOLINE) 25 MG tablet, TAKE 1 TABLET (25 MG TOTAL) BY MOUTH 3 (THREE) TIMES DAILY., Disp: 90 tablet, Rfl: 1   isosorbide dinitrate (ISORDIL) 30 MG tablet, Take 1 tablet  (30 mg total) by mouth 3 (three) times daily., Disp: 90 tablet, Rfl: 1   lisinopril-hydrochlorothiazide (ZESTORETIC) 20-25 MG tablet, TAKE 1 TABLET BY MOUTH DAILY., Disp: 90 tablet, Rfl: 1   loratadine (CLARITIN) 10 MG tablet, Take 1 tablet (10 mg total) by mouth daily., Disp: 30 tablet, Rfl: 11   traZODone (DESYREL) 50 MG tablet, Take 1 tablet (50 mg total) by mouth at bedtime., Disp: 30 tablet, Rfl: 0   Medications ordered in this encounter:  Meds ordered this encounter  Medications   brompheniramine-pseudoephedrine-DM 30-2-10 MG/5ML syrup    Sig: Take 5 mLs by mouth 4 (four) times daily as needed.    Dispense:  120 mL    Refill:  0    Order Specific Question:   Supervising Provider    Answer:   MILLER, BRIAN [[id number]]   albuterol (VENTOLIN HFA) 108 (90 Base) MCG/ACT inhaler    Sig: Inhale 2 puffs into the lungs every 6 (six) hours as needed for wheezing or shortness of breath.    Dispense:  8 g    Refill:  0    Order Specific Question:   Supervising Provider    Answer:   MILLER, BRIAN [[id number]]   benzonatate (TESSALON) 100 MG capsule    Sig: Take 1 capsule (100 mg total) by mouth 3 (three) times daily as needed.    Dispense:  30 capsule    Refill:  0    Order Specific  Question:   Supervising Provider    Answer:   Noemi Chapel [[id number]]     *If you need refills on other medications prior to your next appointment, please contact your pharmacy*  Follow-Up: Call back or seek an in-person evaluation if the symptoms worsen or if the condition fails to improve as anticipated.  Other Instructions Influenza, Adult Influenza is also called "the flu." It is an infection in the lungs, nose, and throat (respiratory tract). It spreads easily from person to person (is contagious). The flu causes symptoms that are like a cold, along with high fever and body aches. What are the causes? This condition is caused by the influenza virus. You can get the virus by: Breathing in droplets that are in the air  after a person infected with the flu coughed or sneezed. Touching something that has the virus on it and then touching your mouth, nose, or eyes. What increases the risk? Certain things may make you more likely to get the flu. These include: Not washing your hands often. Having close contact with many people during cold and flu season. Touching your mouth, eyes, or nose without first washing your hands. Not getting a flu shot every year. You may have a higher risk for the flu, and serious problems, such as a lung infection (pneumonia), if you: Are older than 65. Are pregnant. Have a weakened disease-fighting system (immune system) because of a disease or because you are taking certain medicines. Have a long-term (chronic) condition, such as: Heart, kidney, or lung disease. Diabetes. Asthma. Have a liver disorder. Are very overweight (morbidly obese). Have anemia. What are the signs or symptoms? Symptoms usually begin suddenly and last 4-14 days. They may include: Fever and chills. Headaches, body aches, or muscle aches. Sore throat. Cough. Runny or stuffy (congested) nose. Feeling discomfort in your chest. Not wanting to eat as much as normal. Feeling weak or tired. Feeling dizzy. Feeling sick to your stomach or throwing up. How is this treated? If the flu is found early, you can be treated with antiviral medicine. This can help to reduce how bad the illness is and how long it lasts. This may be given by mouth or through an IV tube. Taking care of yourself at home can help your symptoms get better. Your doctor may want you to: Take over-the-counter medicines. Drink plenty of fluids. The flu often goes away on its own. If you have very bad symptoms or other problems, you may be treated in a hospital. Follow these instructions at home:   Activity Rest as needed. Get plenty of sleep. Stay home from work or school as told by your doctor. Do not leave home until you do not have a  fever for 24 hours without taking medicine. Leave home only to go to your doctor. Eating and drinking Take an ORS (oral rehydration solution). This is a drink that is sold at pharmacies and stores. Drink enough fluid to keep your pee pale yellow. Drink clear fluids in small amounts as you are able. Clear fluids include: Water. Ice chips. Fruit juice mixed with water. Low-calorie sports drinks. Eat bland foods that are easy to digest. Eat small amounts as you are able. These foods include: Bananas. Applesauce. Rice. Lean meats. Toast. Crackers. Do not eat or drink: Fluids that have a lot of sugar or caffeine. Alcohol. Spicy or fatty foods. General instructions Take over-the-counter and prescription medicines only as told by your doctor. Use a cool mist humidifier to add moisture  to the air in your home. This can make it easier for you to breathe. When using a cool mist humidifier, clean it daily. Empty water and replace with clean water. Cover your mouth and nose when you cough or sneeze. Wash your hands with soap and water often and for at least 20 seconds. This is also important after you cough or sneeze. If you cannot use soap and water, use alcohol-based hand sanitizer. Keep all follow-up visits. How is this prevented?  Get a flu shot every year. You may get the flu shot in late summer, fall, or winter. Ask your doctor when you should get your flu shot. Avoid contact with people who are sick during fall and winter. This is cold and flu season. Contact a doctor if: You get new symptoms. You have: Chest pain. Watery poop (diarrhea). A fever. Your cough gets worse. You start to have more mucus. You feel sick to your stomach. You throw up. Get help right away if you: Have shortness of breath. Have trouble breathing. Have skin or nails that turn a bluish color. Have very bad pain or stiffness in your neck. Get a sudden headache. Get sudden pain in your face or  ear. Cannot eat or drink without throwing up. These symptoms may represent a serious problem that is an emergency. Get medical help right away. Call your local emergency services (911 in the [place] Do not wait to see if the symptoms will go away. Do not drive yourself to the hospital. Summary Influenza is also called "the flu." It is an infection in the lungs, nose, and throat. It spreads easily from person to person. Take over-the-counter and prescription medicines only as told by your doctor. Getting a flu shot every year is the best way to not get the flu. This information is not intended to replace advice given to you by your health care provider. Make sure you discuss any questions you have with your health care provider. Document Revised: 08/27/2019 Document Reviewed: 08/27/2019 Elsevier Patient Education  2022 [employer].    If you have been instructed to have an in-person evaluation today at a local Urgent Care facility, please use the link below. It will take you to a list of all of our available [facility], including address, phone number and hours of operation. Please do not delay care.  [facility]  If you or a family member do not have a primary care provider, use the link below to schedule a visit and establish care. When you choose a [facility] primary care physician or advanced practice provider, you gain a long-term partner in health. Find a Primary Care Provider  Learn more about [facility]'s in-office and virtual care options: [facility]

## 2020-11-23 NOTE — Progress Notes (Signed)
Virtual Visit Consent   Rebecca Spence, you are scheduled for a virtual visit with a [facility] provider today.     Just as with appointments in the office, your consent must be obtained to participate.  Your consent will be active for this visit and any virtual visit you may have with one of our providers in the next 365 days.     If you have a MyChart account, a copy of this consent can be sent to you electronically.  All virtual visits are billed to your insurance company just like a traditional visit in the office.    As this is a virtual visit, video technology does not allow for your provider to perform a traditional examination.  This may limit your provider's ability to fully assess your condition.  If your provider identifies any concerns that need to be evaluated in person or the need to arrange testing (such as labs, EKG, etc.), we will make arrangements to do so.     Although advances in technology are sophisticated, we cannot ensure that it will always work on either your end or our end.  If the connection with a video visit is poor, the visit may have to be switched to a telephone visit.  With either a video or telephone visit, we are not always able to ensure that we have a secure connection.     I need to obtain your verbal consent now.   Are you willing to proceed with your visit today?    Rebecca Spence has provided verbal consent on 11/23/2020 for a virtual visit (video or telephone).   Mar Daring, PA-C   Date: 11/23/2020 7:26 PM   Virtual Visit via Video Note   I, Mar Daring, connected with  Rebecca Spence  ([id number], Dec 31, 1980) on 11/23/20 at  6:30 PM EDT by a video-enabled telemedicine application and verified that I am speaking with the correct person using two identifiers.  Location: Patient: Virtual Visit Location Patient: Home Provider: Virtual Visit Location Provider: Home Office   I discussed the limitations of evaluation and  management by telemedicine and the availability of in person appointments. The patient expressed understanding and agreed to proceed.    History of Present Illness: Rebecca Spence is a 40 y.o. who identifies as a female who was assigned female at birth, and is being seen today for flu. Was seen in ER yesterday, 11/22/20. Reports her son is positive for influenza and she has similar symptoms that started Tuesday. She has a fever, chills, body aches, and terrible cough. She reports trying OTC medications without much relief.    Problems:  Patient Active Problem List   Diagnosis Date Noted   Eczema 10/29/2016   DM type 2 (diabetes mellitus, type 2) ([place]) 10/01/2016   Hyperlipidemia 10/01/2016   Essential hypertension 01/21/2014    Allergies:  Allergies  Allergen Reactions   Orange Anaphylaxis and Hives    "My throat starts closing up, my tongue, lips started swelling & burning.  Similar reaction to onions.   Peach [Prunus Persica] Hives    Throat closes up, lips swell and burn   Fentanyl     RN witnessed patient having hives and lip swelling.    Medications:  Current Outpatient Medications:    albuterol (VENTOLIN HFA) 108 (90 Base) MCG/ACT inhaler, Inhale 2 puffs into the lungs every 6 (six) hours as needed for wheezing or shortness of breath., Disp: 8 g, Rfl: 0  benzonatate (TESSALON) 100 MG capsule, Take 1 capsule (100 mg total) by mouth 3 (three) times daily as needed., Disp: 30 capsule, Rfl: 0   brompheniramine-pseudoephedrine-DM 30-2-10 MG/5ML syrup, Take 5 mLs by mouth 4 (four) times daily as needed., Disp: 120 mL, Rfl: 0   amLODipine (NORVASC) 10 MG tablet, TAKE 1 TABLET (10 MG TOTAL) BY MOUTH DAILY., Disp: 90 tablet, Rfl: 3   atorvastatin (LIPITOR) 40 MG tablet, TAKE 1 TABLET (40 MG TOTAL) BY MOUTH DAILY., Disp: 90 tablet, Rfl: 1   azithromycin (ZITHROMAX Z-PAK) 250 MG tablet, Take 2 tablets the first day than take 1 tablet daily until gone, Disp: 6 each, Rfl: 0   FLUoxetine  (PROZAC) 20 MG tablet, Take 2 tablets (40 mg total) by mouth daily. Delete prior refill., Disp: 60 tablet, Rfl: 0   FLUoxetine (PROZAC) 20 MG tablet, TAKE 2 TABLETS (40 MG TOTAL) BY MOUTH DAILY., Disp: 180 tablet, Rfl: 1   fluticasone (FLONASE) 50 MCG/ACT nasal spray, Place 2 sprays into both nostrils daily., Disp: 16 g, Rfl: 6   glipiZIDE (GLUCOTROL) 5 MG tablet, TAKE 1 TABLET (5 MG TOTAL) BY MOUTH 2 (TWO) TIMES DAILY BEFORE A MEAL., Disp: 180 tablet, Rfl: 1   hydrALAZINE (APRESOLINE) 25 MG tablet, TAKE 1 TABLET (25 MG TOTAL) BY MOUTH 3 (THREE) TIMES DAILY., Disp: 90 tablet, Rfl: 1   isosorbide dinitrate (ISORDIL) 30 MG tablet, Take 1 tablet (30 mg total) by mouth 3 (three) times daily., Disp: 90 tablet, Rfl: 1   lisinopril-hydrochlorothiazide (ZESTORETIC) 20-25 MG tablet, TAKE 1 TABLET BY MOUTH DAILY., Disp: 90 tablet, Rfl: 1   loratadine (CLARITIN) 10 MG tablet, Take 1 tablet (10 mg total) by mouth daily., Disp: 30 tablet, Rfl: 11   traZODone (DESYREL) 50 MG tablet, Take 1 tablet (50 mg total) by mouth at bedtime., Disp: 30 tablet, Rfl: 0  Observations/Objective: Patient is well-developed, well-nourished in no acute distress.  Patient appears ill and is very tearful Resting comfortably at home.  Head is normocephalic, atraumatic.  No labored breathing.  Speech is clear and coherent with logical content.  Patient is alert and oriented at baseline.  Frequent, harsh, productive coughing spells heard   Assessment and Plan: 1. Influenza - brompheniramine-pseudoephedrine-DM 30-2-10 MG/5ML syrup; Take 5 mLs by mouth 4 (four) times daily as needed.  Dispense: 120 mL; Refill: 0 - albuterol (VENTOLIN HFA) 108 (90 Base) MCG/ACT inhaler; Inhale 2 puffs into the lungs every 6 (six) hours as needed for wheezing or shortness of breath.  Dispense: 8 g; Refill: 0 - benzonatate (TESSALON) 100 MG capsule; Take 1 capsule (100 mg total) by mouth 3 (three) times daily as needed.  Dispense: 30 capsule; Refill:  0  - Out of treatment window for Tamiflu - Will add Bromfed DM and tessalon perles for cough - Albuterol inhaler for shortness of breath and wheezing - Work note extended - Strict return precautions for breathing discussed, if breathing worsens at all she should call 9-1-1  Follow Up Instructions: I discussed the assessment and treatment plan with the patient. The patient was provided an opportunity to ask questions and all were answered. The patient agreed with the plan and demonstrated an understanding of the instructions.  A copy of instructions were sent to the patient via MyChart unless otherwise noted below.    The patient was advised to call back or seek an in-person evaluation if the symptoms worsen or if the condition fails to improve as anticipated.  Time:  I spent 15 minutes with  the patient via telehealth technology discussing the above problems/concerns.    Mar Daring, PA-C

## 2021-01-01 IMAGING — CT CT RENAL STONE PROTOCOL
2 of 4 series · 15 of 46 positions shown, 17 images · non-contrast
Comparison: CT scan of September 20, 2009.

CLINICAL DATA: Left flank pain.

EXAM:
CT ABDOMEN AND PELVIS WITHOUT CONTRAST
TECHNIQUE: Multidetector CT imaging of the abdomen and pelvis was performed
following the standard protocol without IV contrast.

[Series 2: axial st · axial · 0.67mm/px · z∈[-596,-146]mm · 12 of 102 slices shown, 14 images]
[im 6/102  soft-tissue]
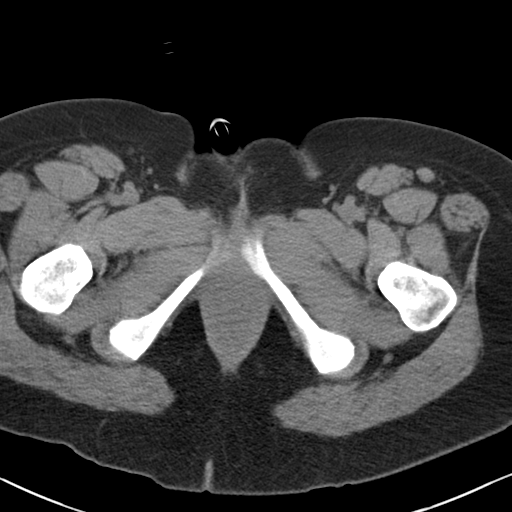
[im 6/102  bone]
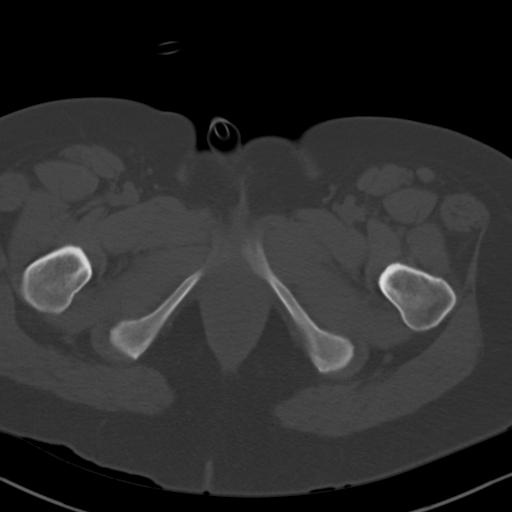
[im 16/102  soft-tissue]
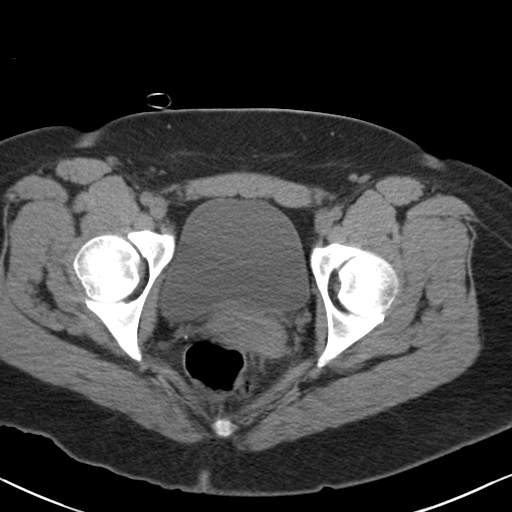
[im 21/102  soft-tissue]
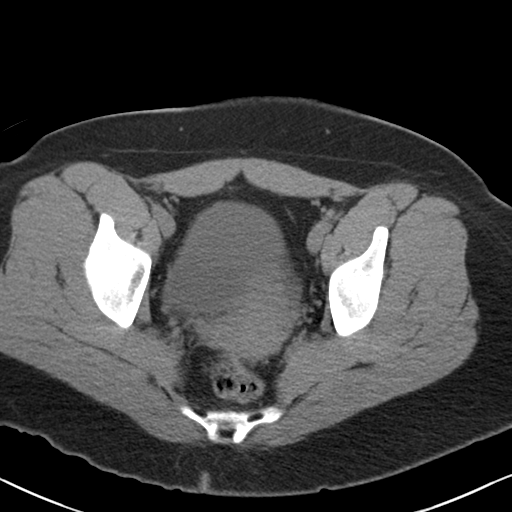
[im 31/102  soft-tissue]
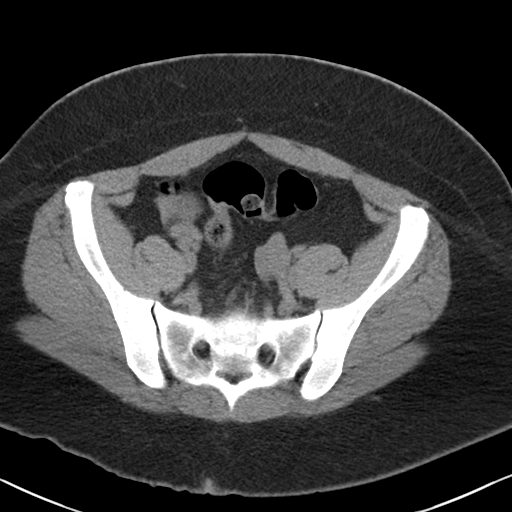
[im 41/102  soft-tissue]
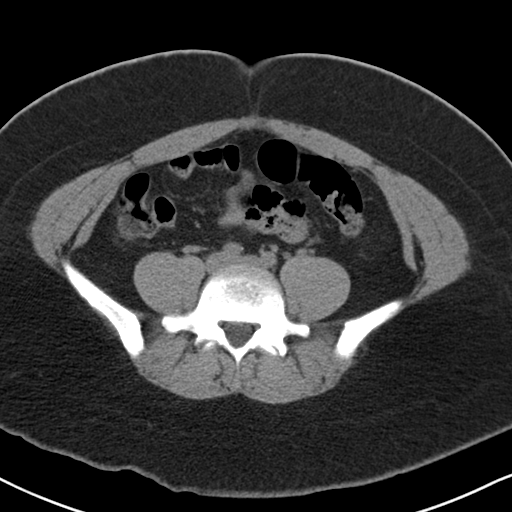
[im 46/102  soft-tissue]
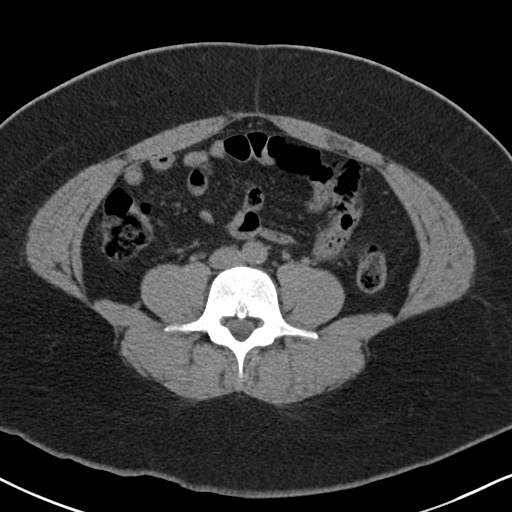
[im 56/102  soft-tissue]
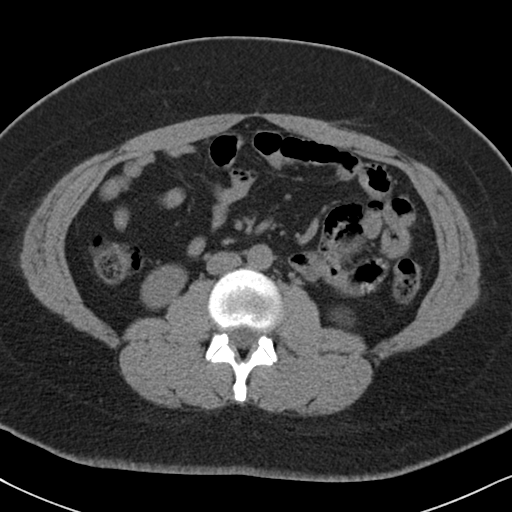
[im 61/102  soft-tissue]
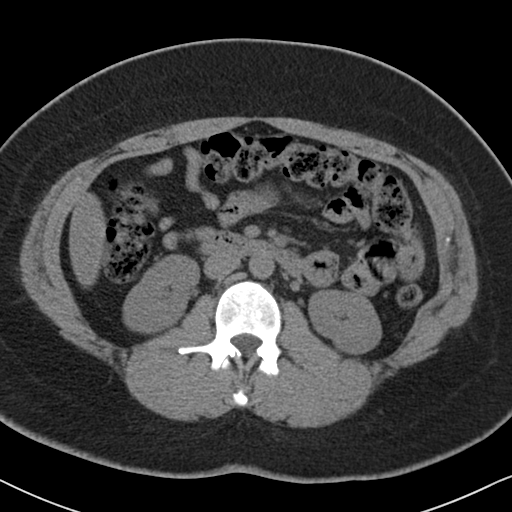
[im 71/102  soft-tissue]
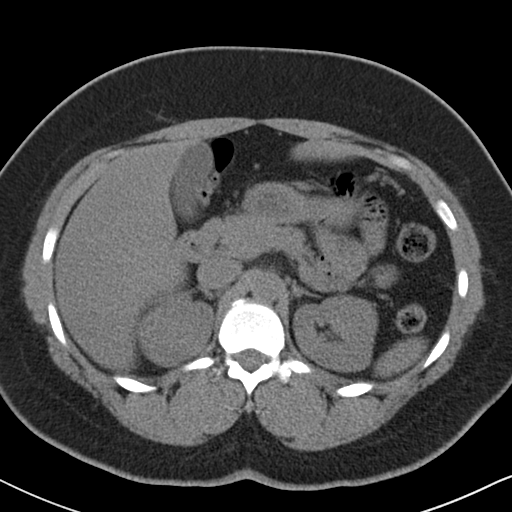
[im 71/102  bone]
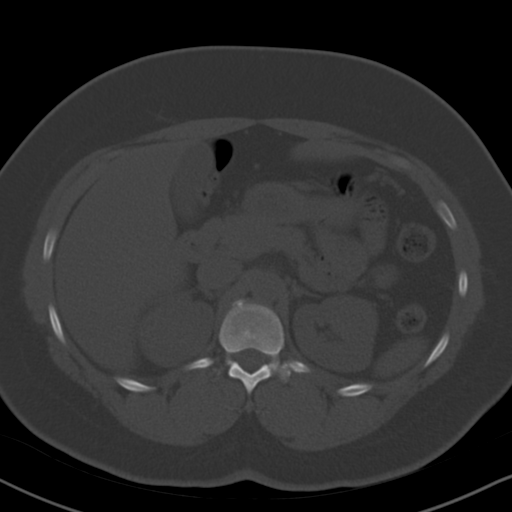
[im 81/102  soft-tissue]
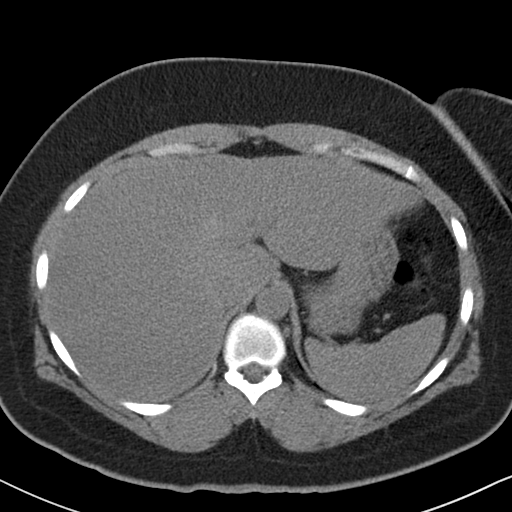
[im 86/102  soft-tissue]
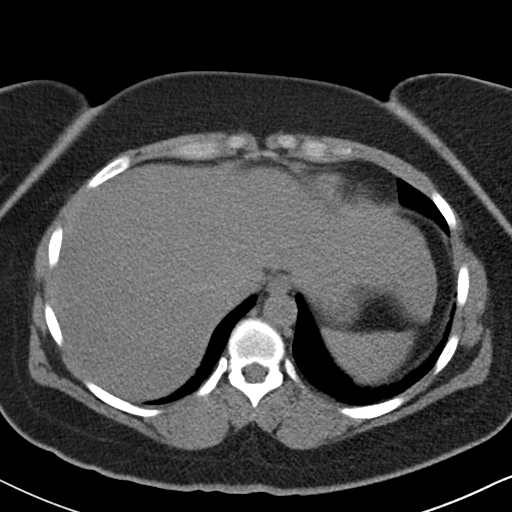
[im 96/102  soft-tissue]
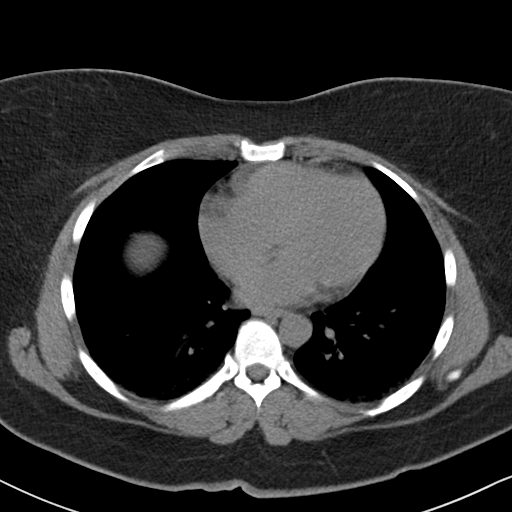

[Series 5: coronal · coronal · 0.65mm/px · 3 of 141 slices shown]
[im 47/141  soft-tissue]
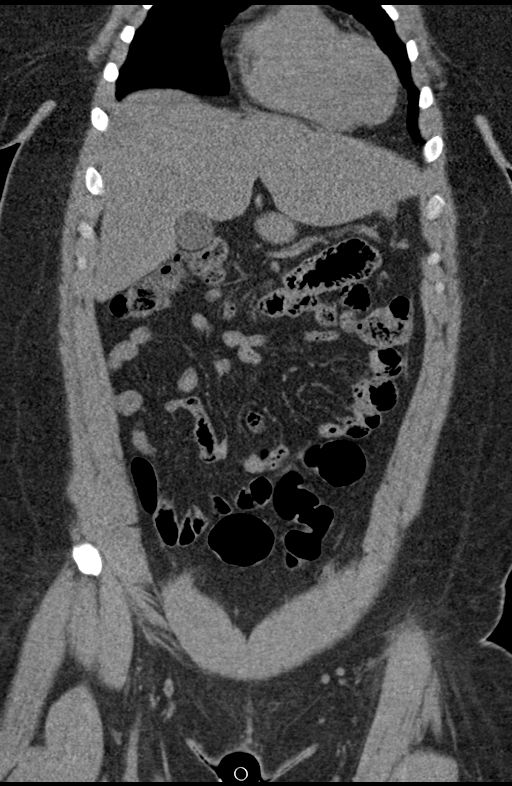
[im 63/141  soft-tissue]
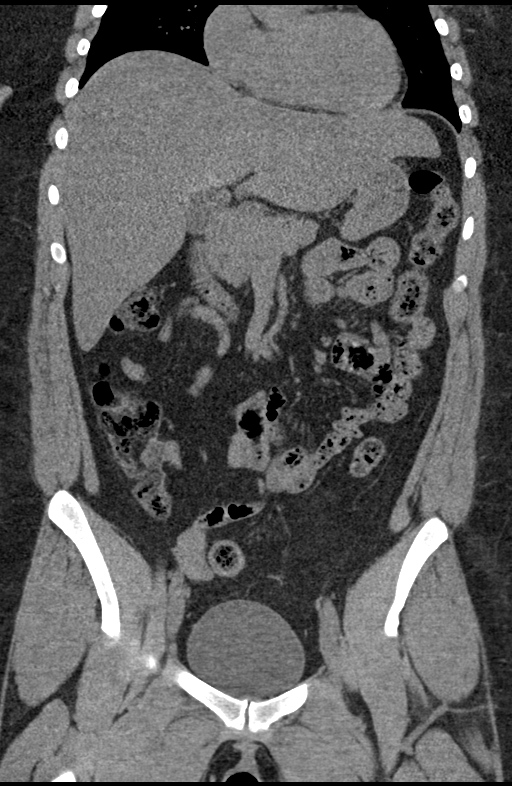
[im 78/141  soft-tissue]
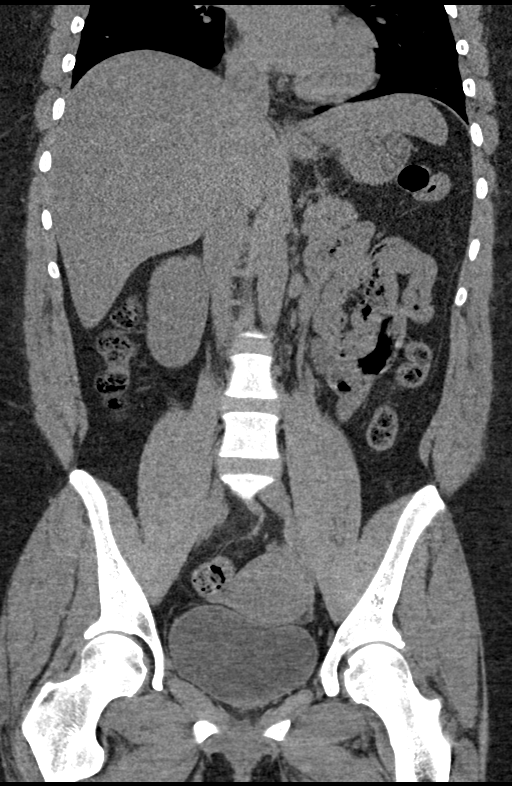

[15 of 46 positions shown; findings below may reference images not displayed]

FINDINGS: Lower chest: No acute abnormality.

Hepatobiliary: No gallstones or biliary dilatation is noted. Hepatic
steatosis is noted.

Pancreas: Unremarkable. No pancreatic ductal dilatation or
surrounding inflammatory changes.

Spleen: Normal in size without focal abnormality.

Adrenals/Urinary Tract: Adrenal glands are unremarkable. Kidneys are
normal, without renal calculi, focal lesion, or hydronephrosis.
Bladder is unremarkable.

Stomach/Bowel: Stomach is within normal limits. Appendix appears
normal. No evidence of bowel wall thickening, distention, or
inflammatory changes.

Vascular/Lymphatic: No significant vascular findings are present. No
enlarged abdominal or pelvic lymph nodes.

Reproductive: Uterus and bilateral adnexa are unremarkable.

Other: No abdominal wall hernia or abnormality. No abdominopelvic
ascites.

Musculoskeletal: No acute or significant osseous findings.
IMPRESSION: Hepatic steatosis.

No renal or ureteral calculi are noted. No hydronephrosis or renal
obstruction is noted.

## 2021-02-07 ENCOUNTER — Ambulatory Visit (INDEPENDENT_AMBULATORY_CARE_PROVIDER_SITE_OTHER): Payer: Managed Care, Other (non HMO) | Admitting: Primary Care

## 2021-02-07 ENCOUNTER — Other Ambulatory Visit: Payer: Self-pay

## 2021-02-07 ENCOUNTER — Encounter (INDEPENDENT_AMBULATORY_CARE_PROVIDER_SITE_OTHER): Payer: Self-pay | Admitting: Primary Care

## 2021-02-07 VITALS — BP 156/110 | HR 87 | Temp 97.8°F | Ht 66.5 in | Wt 229.8 lb

## 2021-02-07 DIAGNOSIS — F4323 Adjustment disorder with mixed anxiety and depressed mood: Secondary | ICD-10-CM

## 2021-02-07 DIAGNOSIS — I10 Essential (primary) hypertension: Secondary | ICD-10-CM

## 2021-02-07 DIAGNOSIS — E119 Type 2 diabetes mellitus without complications: Secondary | ICD-10-CM

## 2021-02-07 DIAGNOSIS — N921 Excessive and frequent menstruation with irregular cycle: Secondary | ICD-10-CM | POA: Diagnosis not present

## 2021-02-07 DIAGNOSIS — E782 Mixed hyperlipidemia: Secondary | ICD-10-CM

## 2021-02-07 DIAGNOSIS — Z23 Encounter for immunization: Secondary | ICD-10-CM

## 2021-02-07 LAB — POCT GLYCOSYLATED HEMOGLOBIN (HGB A1C): Hemoglobin A1C: 6.1 % — AB (ref 4.0–5.6)

## 2021-02-07 MED ORDER — AMLODIPINE BESYLATE 10 MG PO TABS
ORAL_TABLET | Freq: Every day | ORAL | 3 refills | Status: DC
  Filled 2021-02-07: qty 30, 30d supply, fill #0
  Filled 2021-05-21 – 2021-06-05 (×2): qty 30, 30d supply, fill #1
  Filled 2021-07-17 – 2021-09-28 (×3): qty 30, 30d supply, fill #2

## 2021-02-07 MED ORDER — FLUOXETINE HCL 20 MG PO TABS
ORAL_TABLET | Freq: Every day | ORAL | 1 refills | Status: DC
  Filled 2021-02-07: qty 60, 30d supply, fill #0
  Filled 2021-07-17 – 2021-09-28 (×2): qty 60, 30d supply, fill #1

## 2021-02-07 MED ORDER — ISOSORBIDE DINITRATE 30 MG PO TABS
30.0000 mg | ORAL_TABLET | Freq: Three times a day (TID) | ORAL | 1 refills | Status: DC
  Filled 2021-02-07: qty 90, 30d supply, fill #0

## 2021-02-07 NOTE — Progress Notes (Signed)
Rebecca Spence, is a 41 y.o. female  KYH:[id number]  [id number]  DOB - October 17, 1980  Chief Complaint  Patient presents with   Hypertension   Diabetes       Subjective:   Rebecca Spence is a 41 y.o. obese female here today for a follow up visit for HTN, anxiety and depression. She stopped her visit because she lost her insurance. Since then she also stop her medication. Bp is elevated today - Denies shortness of breath, headaches, chest pain or lower extremity edema .  Patient has No headache, No chest pain, No abdominal pain - No Nausea, No new weakness tingling or numbness, No Cough - SOB.Pre diabetes.  Current symptoms/problems include none and have been unchanged.   No problems updated.  ALLERGIES: Allergies  Allergen Reactions   Orange Anaphylaxis and Hives    "My throat starts closing up, my tongue, lips started swelling & burning.  Similar reaction to onions.   Peach [Prunus Persica] Hives    Throat closes up, lips swell and burn   Fentanyl     RN witnessed patient having hives and lip swelling.     PAST MEDICAL HISTORY: Past Medical History:  Diagnosis Date   Anxiety    Bilateral ovarian cysts    Bipolar affective disorder, current episode mixed, current episode severity unspecified ([place])    DM type 2 (diabetes mellitus, type 2) ([place]) 10/01/2016   Eczema 10/29/2016   Nose and hands/forearms   Essential hypertension 2016   Gestational diabetes    09/2016:  prediabetes   Headache(784.0)    Hyperlipidemia 10/01/2016   Prediabetes 10/01/2016   Eye exam with Shirleen Schirmer, M.D. negative for diabetic changes.   Schizophrenic disorder ([facility])     MEDICATIONS AT HOME: Prior to Admission medications   Medication Sig Start Date End Date Taking? Authorizing Provider  albuterol (VENTOLIN HFA) 108 (90 Base) MCG/ACT inhaler Inhale 2 puffs into the lungs every 6 (six) hours as needed for wheezing or shortness of breath. Patient not taking: Reported on 02/07/2021 11/23/20   Mar Daring, PA-C  amLODipine (NORVASC) 10 MG tablet TAKE 1 TABLET (10 MG TOTAL) BY MOUTH DAILY. 08/19/19 08/18/20  Kerin Perna, NP  atorvastatin (LIPITOR) 40 MG tablet TAKE 1 TABLET (40 MG TOTAL) BY MOUTH DAILY. 08/19/19 08/18/20  Kerin Perna, NP  FLUoxetine (PROZAC) 20 MG tablet TAKE 2 TABLETS (40 MG TOTAL) BY MOUTH DAILY. 08/19/19 08/18/20  Kerin Perna, NP  glipiZIDE (GLUCOTROL) 5 MG tablet TAKE 1 TABLET (5 MG TOTAL) BY MOUTH 2 (TWO) TIMES DAILY BEFORE A MEAL. 08/19/19 08/18/20  Kerin Perna, NP  hydrALAZINE (APRESOLINE) 25 MG tablet TAKE 1 TABLET (25 MG TOTAL) BY MOUTH 3 (THREE) TIMES DAILY. 08/19/19 08/18/20  Kerin Perna, NP  lisinopril-hydrochlorothiazide (ZESTORETIC) 20-25 MG tablet TAKE 1 TABLET BY MOUTH DAILY. 08/19/19 08/18/20  Kerin Perna, NP   Comprehensive ROS Pertinent positive and negative noted in HPI   Objective:   Vitals:   02/07/21 1520 02/07/21 1533  BP: (!) 152/101 (!) 156/110  Pulse: 93 87  Temp: 97.8 F (36.6 C)   TempSrc: Oral   SpO2: 96%   Weight: 229 lb 12.8 oz (104.2 kg)   Height: 5' 6.5" (1.689 m)    Exam General appearance : Awake, alert, not in any distress. Speech Clear. Not toxic looking HEENT: Atraumatic and Normocephalic, pupils equally reactive to light and accomodation Neck: Supple, no JVD. No cervical lymphadenopathy.  Chest: Good air entry bilaterally,  no added sounds  CVS: S1 S2 regular, no murmurs.  Abdomen: Bowel sounds present, Non tender and not distended with no gaurding, rigidity or rebound. Extremities: B/L Lower Ext shows no edema, both legs are warm to touch Neurology: Awake alert, and oriented X 3, CN II-XII intact, Non focal Skin: No Rash  Data Review Lab Results  Component Value Date   HGBA1C 6.1 (A) 02/07/2021   HGBA1C 6.2 (A) 08/19/2019   HGBA1C 6.3 (A) 11/25/2018    Assessment & Plan  Rebecca Spence was seen today for hypertension and diabetes.  Diagnoses and all orders for this visit:  1.  Prediabetes  She has been following low carb diet and exercising with no medication for a year. Will continue without medication and lifestyle modification.  Continue to monitor foods that are high in carbohydrates are the following rice, potatoes, breads, sugars, and pastas.  Reduction in the intake (eating) will assist in lowering your blood sugars.  - HgB A1c 6.1 - Microalbumin/Creatinine Ratio, Urine  2. Need for prophylactic vaccination against Streptococcus pneumoniae (pneumococcus)  - Pneumococcal conjugate vaccine 20-valent (Prevnar 20)  3. Need for immunization against influenza  - Flu Vaccine QUAD 6mo+IM (Fluarix, Fluzone & Alfiuria Quad PF)  4. Adjustment disorder with mixed anxiety and depressed mood -     FLUoxetine (PROZAC) 20 MG tablet; TAKE 2 TABLETS (40 MG TOTAL) BY MOUTH DAILY.  5. Essential hypertension Counseled on blood pressure goal of less than 130/80, low-sodium, DASH diet, medication compliance, 150 minutes of moderate intensity exercise per week. Discussed medication compliance, adverse effects.  -     amLODipine (NORVASC) 10 MG tablet; TAKE 1 TABLET (10 MG TOTAL) BY MOUTH DAILY. -     isosorbide dinitrate (ISORDIL) 30 MG tablet; Take 1 tablet (30 mg total) by mouth 3 (three) times daily.  Menorrhagia with irregular cycle Heavy 2 times a week uses to boxes of pads each cycle CbC with diff   Patient have been counseled extensively about nutrition and exercise. Other issues discussed during this visit include: low cholesterol diet, weight control and daily exercise, foot care, annual eye examinations at Ophthalmology, importance of adherence with medications and regular follow-up. We also discussed long term complications of uncontrolled diabetes and hypertension.   Return in about 3 weeks (around 02/28/2021).  The patient was given clear instructions to go to ER or return to medical center if symptoms don't improve, worsen or new problems develop. The patient  verbalized understanding. The patient was told to call to get lab results if they haven't heard anything in the next week.   This note has been created with Surveyor, quantity. Any transcriptional errors are unintentional.    Kerin Perna,  02/07/2021, 3:56 PM

## 2021-02-07 NOTE — Patient Instructions (Addendum)
Influenza, Adult °Influenza is also called "the flu." It is an infection in the lungs, nose, and throat (respiratory tract). It spreads easily from person to person (is contagious). The flu causes symptoms that are like a cold, along with high fever and body aches. °What are the causes? °This condition is caused by the influenza virus. You can get the virus by: °Breathing in droplets that are in the air after a person infected with the flu coughed or sneezed. °Touching something that has the virus on it and then touching your mouth, nose, or eyes. °What increases the risk? °Certain things may make you more likely to get the flu. These include: °Not washing your hands often. °Having close contact with many people during cold and flu season. °Touching your mouth, eyes, or nose without first washing your hands. °Not getting a flu shot every year. °You may have a higher risk for the flu, and serious problems, such as a lung infection (pneumonia), if you: °Are older than 65. °Are pregnant. °Have a weakened disease-fighting system (immune system) because of a disease or because you are taking certain medicines. °Have a long-term (chronic) condition, such as: °Heart, kidney, or lung disease. °Diabetes. °Asthma. °Have a liver disorder. °Are very overweight (morbidly obese). °Have anemia. °What are the signs or symptoms? °Symptoms usually begin suddenly and last 4-14 days. They may include: °Fever and chills. °Headaches, body aches, or muscle aches. °Sore throat. °Cough. °Runny or stuffy (congested) nose. °Feeling discomfort in your chest. °Not wanting to eat as much as normal. °Feeling weak or tired. °Feeling dizzy. °Feeling sick to your stomach or throwing up. °How is this treated? °If the flu is found early, you can be treated with antiviral medicine. This can help to reduce how bad the illness is and how long it lasts. This may be given by mouth or through an IV tube. °Taking care of yourself at home can help your  symptoms get better. Your doctor may want you to: °Take over-the-counter medicines. °Drink plenty of fluids. °The flu often goes away on its own. If you have very bad symptoms or other problems, you may be treated in a hospital. °Follow these instructions at home: °  °Activity °Rest as needed. Get plenty of sleep. °Stay home from work or school as told by your doctor. °Do not leave home until you do not have a fever for 24 hours without taking medicine. °Leave home only to go to your doctor. °Eating and drinking °Take an ORS (oral rehydration solution). This is a drink that is sold at pharmacies and stores. °Drink enough fluid to keep your pee pale yellow. °Drink clear fluids in small amounts as you are able. Clear fluids include: °Water. °Ice chips. °Fruit juice mixed with water. °Low-calorie sports drinks. °Eat bland foods that are easy to digest. Eat small amounts as you are able. These foods include: °Bananas. °Applesauce. °Rice. °Lean meats. °Toast. °Crackers. °Do not eat or drink: °Fluids that have a lot of sugar or caffeine. °Alcohol. °Spicy or fatty foods. °General instructions °Take over-the-counter and prescription medicines only as told by your doctor. °Use a cool mist humidifier to add moisture to the air in your home. This can make it easier for you to breathe. °When using a cool mist humidifier, clean it daily. Empty water and replace with clean water. °Cover your mouth and nose when you cough or sneeze. °Wash your hands with soap and water often and for at least 20 seconds. This is also important after   you cough or sneeze. If you cannot use soap and water, use alcohol-based hand sanitizer. Keep all follow-up visits. How is this prevented?  Get a flu shot every year. You may get the flu shot in late summer, fall, or winter. Ask your doctor when you should get your flu shot. Avoid contact with people who are sick during fall and winter. This is cold and flu season. Contact a doctor if: You get  new symptoms. You have: Chest pain. Watery poop (diarrhea). A fever. Your cough gets worse. You start to have more mucus. You feel sick to your stomach. You throw up. Get help right away if you: Have shortness of breath. Have trouble breathing. Have skin or nails that turn a bluish color. Have very bad pain or stiffness in your neck. Get a sudden headache. Get sudden pain in your face or ear. Cannot eat or drink without throwing up. These symptoms may represent a serious problem that is an emergency. Get medical help right away. Call your local emergency services (911 in the [place] Do not wait to see if the symptoms will go away. Do not drive yourself to the hospital. Summary Influenza is also called "the flu." It is an infection in the lungs, nose, and throat. It spreads easily from person to person. Take over-the-counter and prescription medicines only as told by your doctor. Getting a flu shot every year is the best way to not get the flu. This information is not intended to replace advice given to you by your health care provider. Make sure you discuss any questions you have with your health care provider. Document Revised: 08/27/2019 Document Reviewed: 08/27/2019 Elsevier Patient Education  2022 [place] can take Naproxen (Aleve) 500 mg at onset of menses and 3-5 hours later; then 250 mg to 500 mg twice daily OR you can take Ibuprofen (Motrin) 600 mg every 6 hrs starting 2-3 days prior to onset of menses.

## 2021-02-08 ENCOUNTER — Other Ambulatory Visit: Payer: Self-pay

## 2021-02-28 ENCOUNTER — Other Ambulatory Visit: Payer: Self-pay

## 2021-02-28 ENCOUNTER — Encounter (INDEPENDENT_AMBULATORY_CARE_PROVIDER_SITE_OTHER): Payer: Self-pay | Admitting: Primary Care

## 2021-02-28 ENCOUNTER — Ambulatory Visit (INDEPENDENT_AMBULATORY_CARE_PROVIDER_SITE_OTHER): Payer: Managed Care, Other (non HMO) | Admitting: Primary Care

## 2021-02-28 DIAGNOSIS — N921 Excessive and frequent menstruation with irregular cycle: Secondary | ICD-10-CM | POA: Diagnosis not present

## 2021-02-28 DIAGNOSIS — I10 Essential (primary) hypertension: Secondary | ICD-10-CM

## 2021-02-28 DIAGNOSIS — E119 Type 2 diabetes mellitus without complications: Secondary | ICD-10-CM | POA: Diagnosis not present

## 2021-02-28 DIAGNOSIS — E782 Mixed hyperlipidemia: Secondary | ICD-10-CM

## 2021-02-28 MED ORDER — LISINOPRIL-HYDROCHLOROTHIAZIDE 20-25 MG PO TABS
1.0000 | ORAL_TABLET | Freq: Every day | ORAL | 1 refills | Status: DC
  Filled 2021-02-28: qty 30, 30d supply, fill #0

## 2021-02-28 NOTE — Progress Notes (Signed)
[place], is a 41 y.o. female  ATF:[id number]  YHC:[id number]  DOB - 1980-02-10  Chief Complaint  Patient presents with   Hypertension       Subjective:   Rebecca Spence is a 41 y.o. female here today for a follow up visit. Patient has No headache, No chest pain, No abdominal pain - No Nausea, No new weakness tingling or numbness, No Cough - SOB.  No problems updated.  ALLERGIES: Allergies  Allergen Reactions   Orange Anaphylaxis and Hives    "My throat starts closing up, my tongue, lips started swelling & burning.  Similar reaction to onions.   Peach [Prunus Persica] Hives    Throat closes up, lips swell and burn   Fentanyl     RN witnessed patient having hives and lip swelling.     PAST MEDICAL HISTORY: Past Medical History:  Diagnosis Date   Anxiety    Bilateral ovarian cysts    Bipolar affective disorder, current episode mixed, current episode severity unspecified ([place])    DM type 2 (diabetes mellitus, type 2) ([place]) 10/01/2016   Eczema 10/29/2016   Nose and hands/forearms   Essential hypertension 2016   Gestational diabetes    09/2016:  prediabetes   Headache(784.0)    Hyperlipidemia 10/01/2016   Prediabetes 10/01/2016   Eye exam with Shirleen Schirmer, M.D. negative for diabetic changes.   Schizophrenic disorder ([place])     MEDICATIONS AT HOME: Prior to Admission medications   Medication Sig Start Date End Date Taking? Authorizing Provider  amLODipine (NORVASC) 10 MG tablet TAKE 1 TABLET (10 MG TOTAL) BY MOUTH DAILY. 02/07/21 02/07/22 Yes Kerin Perna, NP  FLUoxetine (PROZAC) 20 MG tablet TAKE 2 TABLETS (40 MG TOTAL) BY MOUTH DAILY. 02/07/21 02/07/22 Yes Kerin Perna, NP  albuterol (VENTOLIN HFA) 108 (90 Base) MCG/ACT inhaler Inhale 2 puffs into the lungs every 6 (six) hours as needed for wheezing or shortness of breath. Patient not taking: Reported on 02/07/2021 11/23/20   Mar Daring, PA-C  atorvastatin (LIPITOR) 40 MG  tablet TAKE 1 TABLET (40 MG TOTAL) BY MOUTH DAILY. 08/19/19 08/18/20  Kerin Perna, NP  glipiZIDE (GLUCOTROL) 5 MG tablet TAKE 1 TABLET (5 MG TOTAL) BY MOUTH 2 (TWO) TIMES DAILY BEFORE A MEAL. 08/19/19 08/18/20  Kerin Perna, NP  hydrALAZINE (APRESOLINE) 25 MG tablet TAKE 1 TABLET (25 MG TOTAL) BY MOUTH 3 (THREE) TIMES DAILY. 08/19/19 08/18/20  Kerin Perna, NP  lisinopril-hydrochlorothiazide (ZESTORETIC) 20-25 MG tablet TAKE 1 TABLET BY MOUTH DAILY. 02/28/21 02/28/22  Kerin Perna, NP    Objective:   Vitals:   02/28/21 1038  BP: 121/83  Pulse: 92  Temp: 97.7 F (36.5 C)  TempSrc: Oral  SpO2: 94%  Weight: 229 lb 3.2 oz (104 kg)  Height: 5' 6.5" (1.689 m)   Exam General appearance : Awake, alert, not in any distress. Speech Clear. Not toxic looking HEENT: Atraumatic and Normocephalic, pupils equally reactive to light and accomodation Neck: Supple, no JVD. No cervical lymphadenopathy.  Chest: Good air entry bilaterally, no added sounds  CVS: S1 S2 regular, no murmurs.  Abdomen: Bowel sounds present, Non tender and not distended with no gaurding, rigidity or rebound. Extremities: B/L Lower Ext shows no edema, both legs are warm to touch Neurology: Awake alert, and oriented X 3, CN II-XII intact, Non focal Skin: No Rash  Data Review Lab Results  Component Value Date   HGBA1C 6.1 (A) 02/07/2021  HGBA1C 6.2 (A) 08/19/2019   HGBA1C 6.3 (A) 11/25/2018    Assessment & Plan   1. Prediabetes 6.1 not taking/on medication will continue with life style modifications monitoring carbs and excercising.  - Microalbumin/Creatinine Ratio, Urine  2. Essential hypertension Changed Bp lisinopril/HCTZ  20/25 (IMDUR was making her sick every time she took it continue taking until this appt) Counseled on blood pressure goal of less than 130/80, low-sodium, DASH diet, medication compliance, 150 minutes of moderate intensity exercise per week. Discussed medication compliance,  adverse effects.  - CMP14+EGFR  3. Menorrhagia with irregular cycle Heavy irregular menstrual cycles  - CBC with Differential/Platelet  4. Mixed hyperlipidemia  Healthy lifestyle diet of fruits vegetables fish nuts whole grains and low saturated fat . Foods high in cholesterol or liver, fatty meats,cheese, butter avocados, nuts and seeds, chocolate and fried foods.  - Lipid panel    Patient have been counseled extensively about nutrition and exercise. Other issues discussed during this visit include: low cholesterol diet, weight control and daily exercise, foot care, annual eye examinations at Ophthalmology, importance of adherence with medications and regular follow-up. We also discussed long term complications of uncontrolled diabetes and hypertension.   Return in about 2 months (around 04/28/2021) for Bp f/u changed meds.  The patient was given clear instructions to go to ER or return to medical center if symptoms don't improve, worsen or new problems develop. The patient verbalized understanding. The patient was told to call to get lab results if they haven't heard anything in the next week.   This note has been created with Surveyor, quantity. Any transcriptional errors are unintentional.   Kerin Perna, NP 02/28/2021, 3:04 PM

## 2021-02-28 NOTE — Progress Notes (Signed)
Pt is fasting and has not taken any medication

## 2021-02-28 NOTE — Patient Instructions (Signed)

## 2021-03-01 ENCOUNTER — Other Ambulatory Visit (INDEPENDENT_AMBULATORY_CARE_PROVIDER_SITE_OTHER): Payer: Self-pay | Admitting: Primary Care

## 2021-03-01 ENCOUNTER — Other Ambulatory Visit: Payer: Self-pay

## 2021-03-01 DIAGNOSIS — E782 Mixed hyperlipidemia: Secondary | ICD-10-CM

## 2021-03-01 LAB — CBC WITH DIFFERENTIAL/PLATELET
Basophils Absolute: 0.1 10*3/uL (ref 0.0–0.2)
Basos: 1 %
EOS (ABSOLUTE): 0.4 10*3/uL (ref 0.0–0.4)
Eos: 7 %
Hematocrit: 38.2 % (ref 34.0–46.6)
Hemoglobin: 12.6 g/dL (ref 11.1–15.9)
Immature Grans (Abs): 0 10*3/uL (ref 0.0–0.1)
Immature Granulocytes: 0 %
Lymphocytes Absolute: 2.3 10*3/uL (ref 0.7–3.1)
Lymphs: 35 %
MCH: 29.2 pg (ref 26.6–33.0)
MCHC: 33 g/dL (ref 31.5–35.7)
MCV: 89 fL (ref 79–97)
Monocytes Absolute: 0.8 10*3/uL (ref 0.1–0.9)
Monocytes: 12 %
Neutrophils Absolute: 3 10*3/uL (ref 1.4–7.0)
Neutrophils: 45 %
Platelets: 328 10*3/uL (ref 150–450)
RBC: 4.31 x10E6/uL (ref 3.77–5.28)
RDW: 13.8 % (ref 11.7–15.4)
WBC: 6.6 10*3/uL (ref 3.4–10.8)

## 2021-03-01 LAB — LIPID PANEL
Chol/HDL Ratio: 4.7 ratio — ABNORMAL HIGH (ref 0.0–4.4)
Cholesterol, Total: 208 mg/dL — ABNORMAL HIGH (ref 100–199)
HDL: 44 mg/dL (ref >39)
LDL Chol Calc (NIH): 138 mg/dL — ABNORMAL HIGH (ref 0–99)
Triglycerides: 144 mg/dL (ref 0–149)
VLDL Cholesterol Cal: 26 mg/dL (ref 5–40)

## 2021-03-01 LAB — CMP14+EGFR
ALT: 15 IU/L (ref 0–32)
AST: 19 IU/L (ref 0–40)
Albumin/Globulin Ratio: 1.5 (ref 1.2–2.2)
Albumin: 4.4 g/dL (ref 3.8–4.8)
Alkaline Phosphatase: 110 IU/L (ref 44–121)
BUN/Creatinine Ratio: 11 (ref 9–23)
BUN: 8 mg/dL (ref 6–24)
Bilirubin Total: 0.4 mg/dL (ref 0.0–1.2)
CO2: 22 mmol/L (ref 20–29)
Calcium: 9.6 mg/dL (ref 8.7–10.2)
Chloride: 101 mmol/L (ref 96–106)
Creatinine, Ser: 0.74 mg/dL (ref 0.57–1.00)
Globulin, Total: 2.9 g/dL (ref 1.5–4.5)
Glucose: 92 mg/dL (ref 70–99)
Potassium: 4.6 mmol/L (ref 3.5–5.2)
Sodium: 137 mmol/L (ref 134–144)
Total Protein: 7.3 g/dL (ref 6.0–8.5)
eGFR: 104 mL/min/{1.73_m2} (ref >59)

## 2021-03-01 LAB — MICROALBUMIN / CREATININE URINE RATIO
Creatinine, Urine: 169.3 mg/dL
Microalb/Creat Ratio: 6 mg/g creat (ref 0–29)
Microalbumin, Urine: 10.9 ug/mL

## 2021-03-01 MED ORDER — ATORVASTATIN CALCIUM 20 MG PO TABS
20.0000 mg | ORAL_TABLET | Freq: Every day | ORAL | 3 refills | Status: DC
  Filled 2021-03-01: qty 30, 30d supply, fill #0

## 2021-03-07 ENCOUNTER — Other Ambulatory Visit: Payer: Self-pay

## 2021-03-08 ENCOUNTER — Other Ambulatory Visit: Payer: Self-pay

## 2021-04-30 ENCOUNTER — Ambulatory Visit (INDEPENDENT_AMBULATORY_CARE_PROVIDER_SITE_OTHER): Payer: Managed Care, Other (non HMO) | Admitting: Primary Care

## 2021-05-21 ENCOUNTER — Other Ambulatory Visit: Payer: Self-pay

## 2021-05-28 ENCOUNTER — Other Ambulatory Visit: Payer: Self-pay

## 2021-06-05 ENCOUNTER — Other Ambulatory Visit: Payer: Self-pay

## 2021-07-11 ENCOUNTER — Inpatient Hospital Stay (HOSPITAL_COMMUNITY): Payer: Managed Care, Other (non HMO)

## 2021-07-11 ENCOUNTER — Inpatient Hospital Stay (HOSPITAL_COMMUNITY)
Admission: AD | Admit: 2021-07-11 | Discharge: 2021-07-11 | Disposition: A | Discharge status: Home / Self Care | Payer: Managed Care, Other (non HMO) | Attending: Obstetrics & Gynecology | Admitting: Obstetrics & Gynecology

## 2021-07-11 ENCOUNTER — Encounter (HOSPITAL_COMMUNITY): Payer: Self-pay

## 2021-07-11 DIAGNOSIS — O210 Mild hyperemesis gravidarum: Secondary | ICD-10-CM

## 2021-07-11 DIAGNOSIS — R102 Pelvic and perineal pain: Secondary | ICD-10-CM | POA: Insufficient documentation

## 2021-07-11 DIAGNOSIS — Z3A00 Weeks of gestation of pregnancy not specified: Secondary | ICD-10-CM | POA: Insufficient documentation

## 2021-07-11 DIAGNOSIS — O26899 Other specified pregnancy related conditions, unspecified trimester: Secondary | ICD-10-CM | POA: Insufficient documentation

## 2021-07-11 DIAGNOSIS — O3680X0 Pregnancy with inconclusive fetal viability, not applicable or unspecified: Secondary | ICD-10-CM

## 2021-07-11 LAB — URINALYSIS, ROUTINE W REFLEX MICROSCOPIC
Bilirubin Urine: NEGATIVE
Glucose, UA: NEGATIVE mg/dL
Hgb urine dipstick: NEGATIVE
Ketones, ur: 5 mg/dL — AB
Nitrite: NEGATIVE
Protein, ur: NEGATIVE mg/dL
Specific Gravity, Urine: 1.031 — ABNORMAL HIGH (ref 1.005–1.030)
pH: 5 (ref 5.0–8.0)

## 2021-07-11 LAB — BASIC METABOLIC PANEL
Anion gap: 9 (ref 5–15)
BUN: 10 mg/dL (ref 6–20)
CO2: 24 mmol/L (ref 22–32)
Calcium: 9.4 mg/dL (ref 8.9–10.3)
Chloride: 104 mmol/L (ref 98–111)
Creatinine, Ser: 0.96 mg/dL (ref 0.44–1.00)
GFR, Estimated: >60 mL/min (ref >60)
Glucose, Bld: 128 mg/dL — ABNORMAL HIGH (ref 70–99)
Potassium: 3.8 mmol/L (ref 3.5–5.1)
Sodium: 137 mmol/L (ref 135–145)

## 2021-07-11 LAB — CBC
HCT: 36.5 % (ref 36.0–46.0)
Hemoglobin: 12.3 g/dL (ref 12.0–15.0)
MCH: 30.8 pg (ref 26.0–34.0)
MCHC: 33.7 g/dL (ref 30.0–36.0)
MCV: 91.5 fL (ref 80.0–100.0)
Platelets: 320 10*3/uL (ref 150–400)
RBC: 3.99 MIL/uL (ref 3.87–5.11)
RDW: 14.8 % (ref 11.5–15.5)
WBC: 12.5 10*3/uL — ABNORMAL HIGH (ref 4.0–10.5)
nRBC: 0 % (ref 0.0–0.2)

## 2021-07-11 LAB — WET PREP, GENITAL
Sperm: NONE SEEN
Trich, Wet Prep: NONE SEEN
WBC, Wet Prep HPF POC: <10 (ref <10)
Yeast Wet Prep HPF POC: NONE SEEN

## 2021-07-11 LAB — HCG, QUANTITATIVE, PREGNANCY: hCG, Beta Chain, Quant, S: 2309 m[IU]/mL — ABNORMAL HIGH (ref <5)

## 2021-07-11 LAB — POCT PREGNANCY, URINE: Preg Test, Ur: POSITIVE — AB

## 2021-07-11 NOTE — MAU Note (Signed)
..  Rebecca Spence is a 41 y.o. at Unknown here in MAU reporting: abdominal cramping and vomited 3 times today. Had three positive pregnancy tests at home. Denies vaginal bleeding.  LMP: 06/21/2021 but was only for a day and really light.  Onset of complaint: couple days she was waiting for her period Pain score: 5/10 Vitals:   07/11/21 1929  BP: 138/84  Pulse: 95  Resp: 17  Temp: 98.5 F (36.9 C)  SpO2: 99%      Lab orders placed from triage:  POCT pregancy test

## 2021-07-11 NOTE — Discharge Instructions (Signed)

## 2021-07-11 NOTE — MAU Provider Note (Signed)
History     CSN: [id number]  Arrival date and time: 07/11/21 1912   Event Date/Time   First Provider Initiated Contact with Patient 07/11/21 2034      Chief Complaint  Patient presents with   Abdominal Pain   Emesis   41 y.o. [id number] '@unknown'$  gestation presenting with N/V and cramping. She just found out she was pregnant yesterday by home test. This pregnancy is unplanned. Reports low abdominal cramping x4 days. Rates pain 5/10. Has not treated it. Denies VB or vaginal discharge. Has 3 episodes of vomiting today.    OB History     Gravida  4   Para  2   Term  2   Preterm  0   AB  1   Living  2      SAB  1   IAB      Ectopic      Multiple      Live Births  2           Past Medical History:  Diagnosis Date   Anxiety    Bilateral ovarian cysts    Bipolar affective disorder, current episode mixed, current episode severity unspecified ([place])    DM type 2 (diabetes mellitus, type 2) ([place]) 10/01/2016   Eczema 10/29/2016   Nose and hands/forearms   Essential hypertension 2016   Gestational diabetes    09/2016:  prediabetes   Headache(784.0)    Hyperlipidemia 10/01/2016   Prediabetes 10/01/2016   Eye exam with Shirleen Schirmer, M.D. negative for diabetic changes.   Schizophrenic disorder ([place])     Past Surgical History:  Procedure Laterality Date   NO PAST SURGERIES      Family History  Problem Relation Age of Onset   Diabetes Mother    Hypertension Mother    Diabetes Maternal Grandmother    Hyperlipidemia Maternal Grandmother    Cancer Sister        cervical   Asthma Son    Allergies Son    Eczema Son    Asthma Son    Allergies Son    Eczema Son     Social History   Tobacco Use   Smoking status: Every Day    Packs/day: 0.25    Years: 13.00    Total pack years: 3.25    Types: Cigarettes   Smokeless tobacco: Never   Tobacco comments:    no time to consider--bums off others.  Vaping Use   Vaping Use: Never used  Substance Use Topics    Alcohol use: Yes    Comment: socially-not with pregnancy   Drug use: Yes    Types: Marijuana    Allergies:  Allergies  Allergen Reactions   Orange Anaphylaxis and Hives    "My throat starts closing up, my tongue, lips started swelling & burning.  Similar reaction to onions.   Peach [Prunus Persica] Hives    Throat closes up, lips swell and burn   Fentanyl     RN witnessed patient having hives and lip swelling.     Medications Prior to Admission  Medication Sig Dispense Refill Last Dose   albuterol (VENTOLIN HFA) 108 (90 Base) MCG/ACT inhaler Inhale 2 puffs into the lungs every 6 (six) hours as needed for wheezing or shortness of breath. 8 g 0 Past Month   amLODipine (NORVASC) 10 MG tablet TAKE 1 TABLET (10 MG TOTAL) BY MOUTH DAILY. 90 tablet 3 07/11/2021   atorvastatin (LIPITOR) 20 MG tablet Take 1 tablet (20 mg  total) by mouth daily. 90 tablet 3 07/11/2021   FLUoxetine (PROZAC) 20 MG tablet TAKE 2 TABLETS (40 MG TOTAL) BY MOUTH DAILY. 180 tablet 1 07/11/2021   hydrALAZINE (APRESOLINE) 25 MG tablet TAKE 1 TABLET (25 MG TOTAL) BY MOUTH 3 (THREE) TIMES DAILY. 90 tablet 1    lisinopril-hydrochlorothiazide (ZESTORETIC) 20-25 MG tablet TAKE 1 TABLET BY MOUTH DAILY. 90 tablet 1     Review of Systems  Gastrointestinal:  Positive for abdominal pain, nausea and vomiting.  Genitourinary:  Negative for dysuria, hematuria, urgency, vaginal bleeding and vaginal discharge.   Physical Exam   Blood pressure 140/89, pulse 90, temperature 98.5 F (36.9 C), temperature source Oral, resp. rate 17, height '5\' 6"'$  (1.676 m), weight 104.1 kg, last menstrual period 02/20/2021, SpO2 99 %.  Physical Exam Vitals and nursing note reviewed.  Constitutional:      General: She is not in acute distress.    Appearance: Normal appearance.  HENT:     Head: Normocephalic and atraumatic.  Pulmonary:     Effort: Pulmonary effort is normal. No respiratory distress.  Abdominal:     General: There is no distension.      Palpations: Abdomen is soft. There is no mass.     Tenderness: There is no abdominal tenderness. There is no guarding or rebound.     Hernia: No hernia is present.  Musculoskeletal:        General: Normal range of motion.     Cervical back: Normal range of motion.  Skin:    General: Skin is warm and dry.  Neurological:     General: No focal deficit present.     Mental Status: She is alert and oriented to person, place, and time.  Psychiatric:        Mood and Affect: Mood normal.        Behavior: Behavior normal.    Results for orders placed or performed during the hospital encounter of 07/11/21 (from the past 24 hour(s))  Pregnancy, urine POC     Status: Abnormal   Collection Time: 07/11/21  7:43 PM  Result Value Ref Range   Preg Test, Ur POSITIVE (A) NEGATIVE  Urinalysis, Routine w reflex microscopic Urine, Clean Catch     Status: Abnormal   Collection Time: 07/11/21  7:45 PM  Result Value Ref Range   Color, Urine YELLOW YELLOW   APPearance HAZY (A) CLEAR   Specific Gravity, Urine 1.031 (H) 1.005 - 1.030   pH 5.0 5.0 - 8.0   Glucose, UA NEGATIVE NEGATIVE mg/dL   Hgb urine dipstick NEGATIVE NEGATIVE   Bilirubin Urine NEGATIVE NEGATIVE   Ketones, ur 5 (A) NEGATIVE mg/dL   Protein, ur NEGATIVE NEGATIVE mg/dL   Nitrite NEGATIVE NEGATIVE   Leukocytes,Ua SMALL (A) NEGATIVE   RBC / HPF 0-5 0 - 5 RBC/hpf   WBC, UA 0-5 0 - 5 WBC/hpf   Bacteria, UA FEW (A) NONE SEEN   Squamous Epithelial / LPF 11-20 0 - 5   Mucus PRESENT   Wet prep, genital     Status: Abnormal   Collection Time: 07/11/21  8:30 PM  Result Value Ref Range   Yeast Wet Prep HPF POC NONE SEEN NONE SEEN   Trich, Wet Prep NONE SEEN NONE SEEN   Clue Cells Wet Prep HPF POC PRESENT (A) NONE SEEN   WBC, Wet Prep HPF POC <10 <10   Sperm NONE SEEN   CBC     Status: Abnormal   Collection Time: 07/11/21  8:41 PM  Result Value Ref Range   WBC 12.5 (H) 4.0 - 10.5 K/uL   RBC 3.99 3.87 - 5.11 MIL/uL   Hemoglobin 12.3  12.0 - 15.0 g/dL   HCT 36.5 36.0 - 46.0 %   MCV 91.5 80.0 - 100.0 fL   MCH 30.8 26.0 - 34.0 pg   MCHC 33.7 30.0 - 36.0 g/dL   RDW 14.8 11.5 - 15.5 %   Platelets 320 150 - 400 K/uL   nRBC 0.0 0.0 - 0.2 %  hCG, quantitative, pregnancy     Status: Abnormal   Collection Time: 07/11/21  8:41 PM  Result Value Ref Range   hCG, Beta Chain, Quant, S 2,309 (H) <5 mIU/mL  Basic metabolic panel     Status: Abnormal   Collection Time: 07/11/21  8:41 PM  Result Value Ref Range   Sodium 137 135 - 145 mmol/L   Potassium 3.8 3.5 - 5.1 mmol/L   Chloride 104 98 - 111 mmol/L   CO2 24 22 - 32 mmol/L   Glucose, Bld 128 (H) 70 - 99 mg/dL   BUN 10 6 - 20 mg/dL   Creatinine, Ser 0.96 0.44 - 1.00 mg/dL   Calcium 9.4 8.9 - 10.3 mg/dL   GFR, Estimated >60 >60 mL/min   Anion gap 9 5 - 15   US OB LESS THAN 14 WEEKS WITH OB TRANSVAGINAL  Result Date: 07/11/2021 CLINICAL DATA:  Pelvic pain with positive pregnancy test EXAM: OBSTETRIC <14 WK [place] AND TRANSVAGINAL OB US TECHNIQUE: Both transabdominal and transvaginal ultrasound examinations were performed for complete evaluation of the gestation as well as the maternal uterus, adnexal regions, and pelvic cul-de-sac. Transvaginal technique was performed to assess early pregnancy. COMPARISON:  None Available. FINDINGS: Intrauterine gestational sac: Absent Maternal uterus/adnexae: Uterus is well visualized with mild endometrial thickening. Again no gestational sac is seen. Fundal fibroid measuring up to 2.0 cm is noted. Ovaries are within normal limits. IMPRESSION: Positive pregnancy test with no definitive intrauterine or extra uterine gestational sac. Recommend follow-up quantitative B-HCG levels and follow-up US in 14-21 days to assess viability. This recommendation follows SRU consensus guidelines: Diagnostic Criteria for Nonviable Pregnancy Early in the First Trimester. [employer] Med 2013; 962:9528-41. Fundal uterine fibroid Electronically Signed   By: Inez Catalina M.D.    On: 07/11/2021 21:29    MAU Course  Procedures  MDM Declines antiemetic. No emesis during evaluation. Labs and [place] ordered and reviewed. No IUGS, YS or FP seen on [place]. Consult with Dr. Roselie Awkward. Findings could indicate early pregnancy, ectopic pregnancy, or failed pregnancy, discussed with pt. Will follow quant in 48 hrs. Stable for discharge home.   Assessment and Plan   1. Pregnancy, location unknown   2. Morning sickness    Discharge home Follow up in MAU on 6/24 (weekend) for stat qhcg Ectopic/SAB precautions  Allergies as of 07/11/2021       Reactions   Orange Anaphylaxis, Hives   "My throat starts closing up, my tongue, lips started swelling & burning.  Similar reaction to onions.   Peach [prunus Persica] Hives   Throat closes up, lips swell and burn   Fentanyl    RN witnessed patient having hives and lip swelling.         Medication List     STOP taking these medications    atorvastatin 20 MG tablet Commonly known as: LIPITOR   hydrALAZINE 25 MG tablet Commonly known as: APRESOLINE   lisinopril-hydrochlorothiazide 20-25 MG tablet  Commonly known as: ZESTORETIC       TAKE these medications    albuterol 108 (90 Base) MCG/ACT inhaler Commonly known as: VENTOLIN HFA Inhale 2 puffs into the lungs every 6 (six) hours as needed for wheezing or shortness of breath.   amLODipine 10 MG tablet Commonly known as: NORVASC TAKE 1 TABLET (10 MG TOTAL) BY MOUTH DAILY.   FLUoxetine 20 MG tablet Commonly known as: PROZAC TAKE 2 TABLETS (40 MG TOTAL) BY MOUTH DAILY.       Julianne Handler, CNM 07/11/2021, 8:54 PM

## 2021-07-12 LAB — GC/CHLAMYDIA PROBE AMP (~~LOC~~) NOT AT ARMC
Chlamydia: NEGATIVE
Comment: NEGATIVE
Comment: NORMAL
Neisseria Gonorrhea: NEGATIVE

## 2021-07-14 ENCOUNTER — Inpatient Hospital Stay (HOSPITAL_COMMUNITY): Payer: Managed Care, Other (non HMO) | Attending: Obstetrics & Gynecology

## 2021-07-14 ENCOUNTER — Inpatient Hospital Stay (HOSPITAL_COMMUNITY)
Admission: AD | Admit: 2021-07-14 | Discharge: 2021-07-14 | Disposition: A | Discharge status: Home / Self Care | Payer: Managed Care, Other (non HMO) | Attending: Obstetrics and Gynecology | Admitting: Obstetrics and Gynecology

## 2021-07-14 DIAGNOSIS — Z3A00 Weeks of gestation of pregnancy not specified: Secondary | ICD-10-CM | POA: Insufficient documentation

## 2021-07-14 DIAGNOSIS — Z79631 Long term (current) use of antimetabolite agent: Secondary | ICD-10-CM

## 2021-07-14 DIAGNOSIS — R110 Nausea: Secondary | ICD-10-CM | POA: Diagnosis not present

## 2021-07-14 DIAGNOSIS — O0281 Inappropriate change in quantitative human chorionic gonadotropin (hCG) in early pregnancy: Secondary | ICD-10-CM | POA: Diagnosis present

## 2021-07-14 DIAGNOSIS — Z5181 Encounter for therapeutic drug level monitoring: Secondary | ICD-10-CM

## 2021-07-14 DIAGNOSIS — O3680X0 Pregnancy with inconclusive fetal viability, not applicable or unspecified: Secondary | ICD-10-CM | POA: Diagnosis not present

## 2021-07-14 DIAGNOSIS — O26899 Other specified pregnancy related conditions, unspecified trimester: Secondary | ICD-10-CM | POA: Insufficient documentation

## 2021-07-14 LAB — COMPREHENSIVE METABOLIC PANEL
ALT: 16 U/L (ref 0–44)
AST: 20 U/L (ref 15–41)
Albumin: 3.6 g/dL (ref 3.5–5.0)
Alkaline Phosphatase: 77 U/L (ref 38–126)
Anion gap: 10 (ref 5–15)
BUN: 6 mg/dL (ref 6–20)
CO2: 23 mmol/L (ref 22–32)
Calcium: 9.1 mg/dL (ref 8.9–10.3)
Chloride: 104 mmol/L (ref 98–111)
Creatinine, Ser: 0.76 mg/dL (ref 0.44–1.00)
GFR, Estimated: >60 mL/min (ref >60)
Glucose, Bld: 161 mg/dL — ABNORMAL HIGH (ref 70–99)
Potassium: 4.2 mmol/L (ref 3.5–5.1)
Sodium: 137 mmol/L (ref 135–145)
Total Bilirubin: 0.6 mg/dL (ref 0.3–1.2)
Total Protein: 6.6 g/dL (ref 6.5–8.1)

## 2021-07-14 LAB — CBC
HCT: 36.4 % (ref 36.0–46.0)
Hemoglobin: 12.4 g/dL (ref 12.0–15.0)
MCH: 31.2 pg (ref 26.0–34.0)
MCHC: 34.1 g/dL (ref 30.0–36.0)
MCV: 91.7 fL (ref 80.0–100.0)
Platelets: 318 10*3/uL (ref 150–400)
RBC: 3.97 MIL/uL (ref 3.87–5.11)
RDW: 15.1 % (ref 11.5–15.5)
WBC: 11.8 10*3/uL — ABNORMAL HIGH (ref 4.0–10.5)
nRBC: 0 % (ref 0.0–0.2)

## 2021-07-14 LAB — HCG, QUANTITATIVE, PREGNANCY: hCG, Beta Chain, Quant, S: 1853 m[IU]/mL — ABNORMAL HIGH (ref <5)

## 2021-07-14 MED ORDER — METHOTREXATE FOR ECTOPIC PREGNANCY
50.0000 mg/m2 | Freq: Once | INTRAMUSCULAR | Status: AC
  Administered 2021-07-14: 110 mg via INTRAMUSCULAR
  Filled 2021-07-14: qty 10

## 2021-07-14 MED ORDER — ONDANSETRON 4 MG PO TBDP
4.0000 mg | ORAL_TABLET | Freq: Once | ORAL | Status: AC
  Administered 2021-07-14: 4 mg via ORAL
  Filled 2021-07-14: qty 1

## 2021-07-14 NOTE — MAU Provider Note (Signed)
History     CSN: [id number]  Arrival date and time: 07/14/21 1033   None     Chief Complaint  Patient presents with   Follow-up   Rebecca Spence is a 41 y.o. [id number] at Unknown who presents today for FU HCG. She denies any pain or bleeding today. She was seen here on 07/11/2021 and HCG was 2309, however no IUGS seen on [place]. She is also reporting nausea today, and is requesting antiemetic.    OB History     Gravida  4   Para  2   Term  2   Preterm  0   AB  1   Living  2      SAB  1   IAB      Ectopic      Multiple      Live Births  2           Past Medical History:  Diagnosis Date   Anxiety    Bilateral ovarian cysts    Bipolar affective disorder, current episode mixed, current episode severity unspecified ([place])    DM type 2 (diabetes mellitus, type 2) ([place]) 10/01/2016   Eczema 10/29/2016   Nose and hands/forearms   Essential hypertension 2016   Gestational diabetes    09/2016:  prediabetes   Headache(784.0)    Hyperlipidemia 10/01/2016   Prediabetes 10/01/2016   Eye exam with Shirleen Schirmer, M.D. negative for diabetic changes.   Schizophrenic disorder ([place])     Past Surgical History:  Procedure Laterality Date   NO PAST SURGERIES      Family History  Problem Relation Age of Onset   Diabetes Mother    Hypertension Mother    Diabetes Maternal Grandmother    Hyperlipidemia Maternal Grandmother    Cancer Sister        cervical   Asthma Son    Allergies Son    Eczema Son    Asthma Son    Allergies Son    Eczema Son     Social History   Tobacco Use   Smoking status: Every Day    Packs/day: 0.25    Years: 13.00    Total pack years: 3.25    Types: Cigarettes   Smokeless tobacco: Never   Tobacco comments:    no time to consider--bums off others.  Vaping Use   Vaping Use: Never used  Substance Use Topics   Alcohol use: Yes    Comment: socially-not with pregnancy   Drug use: Yes    Types: Marijuana    Allergies:  Allergies   Allergen Reactions   Orange Anaphylaxis and Hives    "My throat starts closing up, my tongue, lips started swelling & burning.  Similar reaction to onions.   Peach [Prunus Persica] Hives    Throat closes up, lips swell and burn   Fentanyl     RN witnessed patient having hives and lip swelling.     Medications Prior to Admission  Medication Sig Dispense Refill Last Dose   albuterol (VENTOLIN HFA) 108 (90 Base) MCG/ACT inhaler Inhale 2 puffs into the lungs every 6 (six) hours as needed for wheezing or shortness of breath. 8 g 0    amLODipine (NORVASC) 10 MG tablet TAKE 1 TABLET (10 MG TOTAL) BY MOUTH DAILY. 90 tablet 3    FLUoxetine (PROZAC) 20 MG tablet TAKE 2 TABLETS (40 MG TOTAL) BY MOUTH DAILY. 180 tablet 1     Review of Systems  All other  systems reviewed and are negative.  Physical Exam   Blood pressure 126/85, pulse 93, temperature 98.6 F (37 C), temperature source Oral, resp. rate 17, height '5\' 6"'$  (1.676 m), weight 104 kg, last menstrual period 02/20/2021, SpO2 99 %.  Physical Exam Constitutional:      Appearance: She is well-developed.  HENT:     Head: Normocephalic.  Eyes:     Pupils: Pupils are equal, round, and reactive to light.  Cardiovascular:     Rate and Rhythm: Normal rate and regular rhythm.     Heart sounds: Normal heart sounds.  Pulmonary:     Effort: Pulmonary effort is normal. No respiratory distress.     Breath sounds: Normal breath sounds.  Abdominal:     Palpations: Abdomen is soft.     Tenderness: There is no abdominal tenderness.  Genitourinary:    Vagina: No bleeding. Vaginal discharge: mucusy.    Comments: External: no lesion Vagina: small amount of white discharge     Musculoskeletal:        General: Normal range of motion.     Cervical back: Normal range of motion and neck supple.  Skin:    General: Skin is warm and dry.  Neurological:     Mental Status: She is alert and oriented to person, place, and time.  Psychiatric:         Mood and Affect: Mood normal.        Behavior: Behavior normal.      07/11/2021 HCG: 2309 07/14/2021 HCG: 1853    Results for orders placed or performed during the hospital encounter of 07/14/21 (from the past 24 hour(s))  hCG, quantitative, pregnancy     Status: Abnormal   Collection Time: 07/14/21 10:50 AM  Result Value Ref Range   hCG, Beta Chain, Quant, S 1,853 (H) <5 mIU/mL  CBC     Status: Abnormal   Collection Time: 07/14/21  1:12 PM  Result Value Ref Range   WBC 11.8 (H) 4.0 - 10.5 K/uL   RBC 3.97 3.87 - 5.11 MIL/uL   Hemoglobin 12.4 12.0 - 15.0 g/dL   HCT 36.4 36.0 - 46.0 %   MCV 91.7 80.0 - 100.0 fL   MCH 31.2 26.0 - 34.0 pg   MCHC 34.1 30.0 - 36.0 g/dL   RDW 15.1 11.5 - 15.5 %   Platelets 318 150 - 400 K/uL   nRBC 0.0 0.0 - 0.2 %  Comprehensive metabolic panel     Status: Abnormal   Collection Time: 07/14/21  1:12 PM  Result Value Ref Range   Sodium 137 135 - 145 mmol/L   Potassium 4.2 3.5 - 5.1 mmol/L   Chloride 104 98 - 111 mmol/L   CO2 23 22 - 32 mmol/L   Glucose, Bld 161 (H) 70 - 99 mg/dL   BUN 6 6 - 20 mg/dL   Creatinine, Ser 0.76 0.44 - 1.00 mg/dL   Calcium 9.1 8.9 - 10.3 mg/dL   Total Protein 6.6 6.5 - 8.1 g/dL   Albumin 3.6 3.5 - 5.0 g/dL   AST 20 15 - 41 U/L   ALT 16 0 - 44 U/L   Alkaline Phosphatase 77 38 - 126 U/L   Total Bilirubin 0.6 0.3 - 1.2 mg/dL   GFR, Estimated >60 >60 mL/min   Anion gap 10 5 - 15    MAU Course  Procedures  MDM Patient has had '4mg'$  Zofran ODT. She reports nausea is better at this time.   DW Dr.  Bass: 2 options to offer today, we can start MTX protocol or patient can FU in 48 hours for another HCG. He recommends MTX today. HCG is going down so we know it is not a viable pregnancy at this time.   Reviewed results and options with patient and she would like proceed with MTX today.    Assessment and Plan   1. Pregnancy, location unknown   2. Inappropriate change in quantitative hCG in early pregnancy   3. Encounter  for monitoring of methotrexate therapy    DC home in stable condition  Comfort measures reviewed  Bleeding precautions Ectopic precautions RX: none  Return to MAU as needed FU with OB as planned   [place] [facility] Follow up.   Specialty: Obstetrics and Gynecology Why: Tuesday 07/17/2021 for repeat blood work Contact information: [address] [place] [id number] [place] DNP, [place]  07/14/21  2:31 PM

## 2021-07-17 ENCOUNTER — Ambulatory Visit (INDEPENDENT_AMBULATORY_CARE_PROVIDER_SITE_OTHER): Payer: Managed Care, Other (non HMO)

## 2021-07-17 ENCOUNTER — Other Ambulatory Visit: Payer: Self-pay

## 2021-07-17 VITALS — BP 131/85 | HR 79 | Wt 228.4 lb

## 2021-07-17 DIAGNOSIS — Z5181 Encounter for therapeutic drug level monitoring: Secondary | ICD-10-CM

## 2021-07-17 DIAGNOSIS — O3680X0 Pregnancy with inconclusive fetal viability, not applicable or unspecified: Secondary | ICD-10-CM

## 2021-07-17 DIAGNOSIS — Z79631 Long term (current) use of antimetabolite agent: Secondary | ICD-10-CM

## 2021-07-17 LAB — BETA HCG QUANT (REF LAB): hCG Quant: 967 m[IU]/mL

## 2021-07-17 NOTE — Progress Notes (Signed)
Beta HCG Follow-up Visit  Rebecca Spence presents to [facility] for follow-up beta HCG lab. She was seen in MAU for abdominal pain in early pregnancy on 07/11/21 and 07/13/21. Given methotrexate on 07/13/21 due to inappropriate change in beta HCG and pregnancy of unknown location. Patient denies pain or bleeding since administration of MTX. Discussed with patient that we are following beta HCG levels today.Valid contact number for patient confirmed. I will call the patient with results.   Beta HCG results: 07/11/21 2309  07/14/21 DAY 1 MTX 1853  07/17/21 DAY 4 MTX 967   Results and patient history reviewed with Rebecca Lefort, MD, who states decrease in beta HCG is encouraging; however, patient should return for repeat beta HCG on 07/20/21 to confirm MTX was effective. Patient called and informed of plan for follow-up. MyChart message sent with appointment information.  Rebecca Spence 07/17/2021 8:30 AM

## 2021-07-20 ENCOUNTER — Telehealth: Payer: Self-pay

## 2021-07-20 ENCOUNTER — Ambulatory Visit: Payer: Managed Care, Other (non HMO)

## 2021-07-20 NOTE — Telephone Encounter (Signed)
Called pt to follow up on missed appt for stat beta HCG; VM left asking patient to return to office this AM for lab or call office. MyChart message sent.

## 2021-07-25 ENCOUNTER — Other Ambulatory Visit: Payer: Self-pay

## 2021-07-31 ENCOUNTER — Other Ambulatory Visit: Payer: Self-pay

## 2021-07-31 ENCOUNTER — Ambulatory Visit (INDEPENDENT_AMBULATORY_CARE_PROVIDER_SITE_OTHER): Payer: Managed Care, Other (non HMO)

## 2021-07-31 VITALS — BP 130/84 | HR 87 | Wt 228.6 lb

## 2021-07-31 DIAGNOSIS — Z5181 Encounter for therapeutic drug level monitoring: Secondary | ICD-10-CM

## 2021-07-31 DIAGNOSIS — Z79631 Encounter for therapeutic drug level monitoring: Secondary | ICD-10-CM

## 2021-07-31 LAB — BETA HCG QUANT (REF LAB): hCG Quant: 46 m[IU]/mL

## 2021-07-31 NOTE — Progress Notes (Signed)
Beta HCG Follow-up Visit  Rebecca Spence presents to [facility] for follow-up beta HCG lab. She was seen in MAU for abdominal pain on 07/11/21 and 07/14/21. Patient was given methotrexate on 07/14/21 due to inappropriate change in beta HCG and pregnancy of unknown location. Per patient after taking the methotrexate she had vaginal bleeding from July 2-July 8. Patient denies any pain or bleeding today. Discussed with patient that we are following beta HCG levels today. Valid contact number for patient confirmed. I will call the patient with results.   Beta HCG results: 07/11/21 2,309  07/14/21 DAY 1 MTX 1,853  07/17/21 DAY 4 MTX 967  07/31/21 46   Results and patient history reviewed with Dr. Harolyn Rutherford who states HCG levels are decreasing appropriately and that patient needs HCG levels trended to 0. Follow up appointment scheduled. Patient called and informed of plan for follow-up.  [place] 07/31/2021

## 2021-08-01 ENCOUNTER — Other Ambulatory Visit: Payer: Self-pay

## 2021-08-01 DIAGNOSIS — Z5181 Encounter for therapeutic drug level monitoring: Secondary | ICD-10-CM

## 2021-08-01 DIAGNOSIS — O3680X0 Pregnancy with inconclusive fetal viability, not applicable or unspecified: Secondary | ICD-10-CM

## 2021-08-06 ENCOUNTER — Other Ambulatory Visit: Payer: Self-pay

## 2021-08-07 ENCOUNTER — Other Ambulatory Visit: Payer: Managed Care, Other (non HMO)

## 2021-09-07 ENCOUNTER — Other Ambulatory Visit: Payer: Self-pay

## 2021-09-13 ENCOUNTER — Other Ambulatory Visit: Payer: Self-pay

## 2021-09-28 ENCOUNTER — Other Ambulatory Visit: Payer: Self-pay

## 2021-10-04 ENCOUNTER — Other Ambulatory Visit: Payer: Self-pay

## 2021-10-08 ENCOUNTER — Emergency Department (HOSPITAL_COMMUNITY)
Admission: EM | Admit: 2021-10-08 | Discharge: 2021-10-09 | Discharge status: Against Medical Advice | Payer: Managed Care, Other (non HMO) | Attending: Physician Assistant | Admitting: Physician Assistant

## 2021-10-08 ENCOUNTER — Other Ambulatory Visit: Payer: Self-pay

## 2021-10-08 ENCOUNTER — Encounter (HOSPITAL_COMMUNITY): Payer: Self-pay

## 2021-10-08 DIAGNOSIS — Z5321 Procedure and treatment not carried out due to patient leaving prior to being seen by health care provider: Secondary | ICD-10-CM | POA: Diagnosis not present

## 2021-10-08 DIAGNOSIS — R0789 Other chest pain: Secondary | ICD-10-CM | POA: Insufficient documentation

## 2021-10-08 LAB — URINALYSIS, ROUTINE W REFLEX MICROSCOPIC
Bilirubin Urine: NEGATIVE
Glucose, UA: NEGATIVE mg/dL
Ketones, ur: NEGATIVE mg/dL
Nitrite: NEGATIVE
Protein, ur: NEGATIVE mg/dL
Specific Gravity, Urine: 1.026 (ref 1.005–1.030)
pH: 5 (ref 5.0–8.0)

## 2021-10-08 LAB — CBC WITH DIFFERENTIAL/PLATELET
Abs Immature Granulocytes: 0.03 10*3/uL (ref 0.00–0.07)
Basophils Absolute: 0.1 10*3/uL (ref 0.0–0.1)
Basophils Relative: 1 %
Eosinophils Absolute: 0.4 10*3/uL (ref 0.0–0.5)
Eosinophils Relative: 5 %
HCT: 43 % (ref 36.0–46.0)
Hemoglobin: 13.6 g/dL (ref 12.0–15.0)
Immature Granulocytes: 0 %
Lymphocytes Relative: 41 %
Lymphs Abs: 3.7 10*3/uL (ref 0.7–4.0)
MCH: 30.2 pg (ref 26.0–34.0)
MCHC: 31.6 g/dL (ref 30.0–36.0)
MCV: 95.3 fL (ref 80.0–100.0)
Monocytes Absolute: 0.8 10*3/uL (ref 0.1–1.0)
Monocytes Relative: 8 %
Neutro Abs: 4 10*3/uL (ref 1.7–7.7)
Neutrophils Relative %: 45 %
Platelets: 348 10*3/uL (ref 150–400)
RBC: 4.51 MIL/uL (ref 3.87–5.11)
RDW: 14.5 % (ref 11.5–15.5)
WBC: 9 10*3/uL (ref 4.0–10.5)
nRBC: 0 % (ref 0.0–0.2)

## 2021-10-08 LAB — COMPREHENSIVE METABOLIC PANEL
ALT: 16 U/L (ref 0–44)
AST: 20 U/L (ref 15–41)
Albumin: 4.1 g/dL (ref 3.5–5.0)
Alkaline Phosphatase: 84 U/L (ref 38–126)
Anion gap: 8 (ref 5–15)
BUN: 7 mg/dL (ref 6–20)
CO2: 23 mmol/L (ref 22–32)
Calcium: 9.2 mg/dL (ref 8.9–10.3)
Chloride: 104 mmol/L (ref 98–111)
Creatinine, Ser: 0.95 mg/dL (ref 0.44–1.00)
GFR, Estimated: >60 mL/min (ref >60)
Glucose, Bld: 109 mg/dL — ABNORMAL HIGH (ref 70–99)
Potassium: 4.3 mmol/L (ref 3.5–5.1)
Sodium: 135 mmol/L (ref 135–145)
Total Bilirubin: 0.6 mg/dL (ref 0.3–1.2)
Total Protein: 7.2 g/dL (ref 6.5–8.1)

## 2021-10-08 LAB — I-STAT BETA HCG BLOOD, ED (MC, WL, AP ONLY): I-stat hCG, quantitative: <5 m[IU]/mL (ref <5)

## 2021-10-08 LAB — LIPASE, BLOOD: Lipase: 25 U/L (ref 11–51)

## 2021-10-08 NOTE — ED Provider Triage Note (Signed)
Emergency Medicine Provider Triage Evaluation Note  Rebecca Spence , a 41 y.o. female  was evaluated in triage.  Pt complains of pain.  Has had pain to right side over the last 2 months.  Goes from her chest wall down to her abdomen.  Taking Tylenol without relief.  Pain with tenderness to palpation anyplace on right chest, flank, abdomen, scapula.  Worse with movement.  No dysuria hematuria.  No shortness of breath.  Did recently have pregnancy of unknown location 2 months ago.  Had to take methotrexate multiple times.  Her hCG trended down into the 40s however did not follow-up on her last appointment.  She is unsure if pregnancy completely passed  Review of Systems  Positive: pain Negative:   Physical Exam  BP (!) 143/111 (BP Location: Left Arm)   Pulse 90   Temp 98.5 F (36.9 C)   Resp 18   Ht '5\' 6"'$  (1.676 m)   Wt 103.4 kg   LMP 02/20/2021 (Approximate)   SpO2 98%   BMI 36.80 kg/m  Gen:   Awake, no distress   Resp:  Normal effort  MSK:   Moves extremities without difficulty  Other:    Medical Decision Making  Medically screening exam initiated at 5:12 PM.  Appropriate orders placed.  Fadia Marlar was informed that the remainder of the evaluation will be completed by another provider, this initial triage assessment does not replace that evaluation, and the importance of remaining in the ED until their evaluation is complete.  Pain x 2 months   Lora Glomski A, PA-C 10/08/21 1713

## 2021-10-08 NOTE — ED Triage Notes (Signed)
Reports having right shoulder and right chest pain that now radiates down to ruight side x 2 months.  Had misscarriage and had methotrexaste

## 2021-10-09 NOTE — ED Notes (Signed)
Patient leaving d/t wait time

## 2021-10-15 ENCOUNTER — Encounter (INDEPENDENT_AMBULATORY_CARE_PROVIDER_SITE_OTHER): Payer: Self-pay | Admitting: Primary Care

## 2021-10-15 ENCOUNTER — Ambulatory Visit (INDEPENDENT_AMBULATORY_CARE_PROVIDER_SITE_OTHER): Payer: Managed Care, Other (non HMO) | Admitting: Primary Care

## 2021-10-15 DIAGNOSIS — M25511 Pain in right shoulder: Secondary | ICD-10-CM

## 2021-10-15 DIAGNOSIS — N390 Urinary tract infection, site not specified: Secondary | ICD-10-CM
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
## 2021-10-21 ENCOUNTER — Telehealth: Payer: Managed Care, Other (non HMO) | Admitting: Family

## 2021-10-21 DIAGNOSIS — J209 Acute bronchitis, unspecified: Secondary | ICD-10-CM | POA: Diagnosis not present

## 2021-10-21 MED ORDER — BENZONATATE 100 MG PO CAPS
200.0000 mg | ORAL_CAPSULE | Freq: Two times a day (BID) | ORAL | 0 refills | Status: DC | PRN
  Filled 2021-10-21: qty 40, 10d supply, fill #0

## 2021-10-21 MED ORDER — PREDNISONE 10 MG (21) PO TBPK
ORAL_TABLET | ORAL | 0 refills | Status: DC
  Filled 2021-10-21 – 2022-07-18 (×2): qty 21, 6d supply, fill #0

## 2021-10-21 NOTE — Progress Notes (Signed)
Virtual Visit Consent   Rebecca Spence, you are scheduled for a virtual visit with a [facility] provider today. Just as with appointments in the office, your consent must be obtained to participate. Your consent will be active for this visit and any virtual visit you may have with one of our providers in the next 365 days. If you have a MyChart account, a copy of this consent can be sent to you electronically.  As this is a virtual visit, video technology does not allow for your provider to perform a traditional examination. This may limit your provider's ability to fully assess your condition. If your provider identifies any concerns that need to be evaluated in person or the need to arrange testing (such as labs, EKG, etc.), we will make arrangements to do so. Although advances in technology are sophisticated, we cannot ensure that it will always work on either your end or our end. If the connection with a video visit is poor, the visit may have to be switched to a telephone visit. With either a video or telephone visit, we are not always able to ensure that we have a secure connection.  By engaging in this virtual visit, you consent to the provision of healthcare and authorize for your insurance to be billed (if applicable) for the services provided during this visit. Depending on your insurance coverage, you may receive a charge related to this service.  I need to obtain your verbal consent now. Are you willing to proceed with your visit today? Rebecca Spence has provided verbal consent on 10/21/2021 for a virtual visit (video or telephone). Rebecca Dun, FNP  Date: 10/21/2021 7:01 PM  Virtual Visit via Video Note   I, Rebecca Spence, connected with  Rebecca Spence  ([id number], 03/04/1980) on 10/21/21 at  6:45 PM EDT by a video-enabled telemedicine application and verified that I am speaking with the correct person using two identifiers.  Location: Patient: Virtual Visit Location  Patient: Home Provider: Virtual Visit Location Provider: Home Office   I discussed the limitations of evaluation and management by telemedicine and the availability of in person appointments. The patient expressed understanding and agreed to proceed.    History of Present Illness: Rebecca Spence is a 41 y.o. who identifies as a female who was assigned female at birth, and is being seen today for cough. Took a home COVID test that was negative.   HPI: Cough This is a new problem. The current episode started in the past 7 days. The problem has been gradually worsening. The problem occurs every few minutes. The cough is Productive of sputum. Associated symptoms include chills, a fever (101), myalgias, nasal congestion, shortness of breath and wheezing. Pertinent negatives include no ear congestion or ear pain. Risk factors for lung disease include smoking/tobacco exposure. She has tried rest and OTC cough suppressant for the symptoms. The treatment provided mild relief. Her past medical history is significant for asthma.    Problems:  Patient Active Problem List   Diagnosis Date Noted   Eczema 10/29/2016   DM type 2 (diabetes mellitus, type 2) ([place]) 10/01/2016   Hyperlipidemia 10/01/2016   Essential hypertension 01/21/2014    Allergies:  Allergies  Allergen Reactions   Orange Anaphylaxis and Hives    "My throat starts closing up, my tongue, lips started swelling & burning.  Similar reaction to onions.   Peach [Prunus Persica] Hives    Throat closes up, lips swell and burn   Fentanyl  RN witnessed patient having hives and lip swelling.    Medications:  Current Outpatient Medications:    benzonatate (TESSALON) 200 MG capsule, Take 1 capsule (200 mg total) by mouth 2 (two) times daily as needed for cough., Disp: 20 capsule, Rfl: 0   predniSONE (STERAPRED UNI-PAK 21 TAB) 10 MG (21) TBPK tablet, Use as directed, Disp: 21 tablet, Rfl: 0   acetaminophen (TYLENOL) 325 MG tablet, Take  650 mg by mouth every 6 (six) hours as needed., Disp: , Rfl:    albuterol (VENTOLIN HFA) 108 (90 Base) MCG/ACT inhaler, Inhale 2 puffs into the lungs every 6 (six) hours as needed for wheezing or shortness of breath., Disp: 8 g, Rfl: 0   amLODipine (NORVASC) 10 MG tablet, TAKE 1 TABLET (10 MG TOTAL) BY MOUTH DAILY., Disp: 90 tablet, Rfl: 3   FLUoxetine (PROZAC) 20 MG tablet, TAKE 2 TABLETS (40 MG TOTAL) BY MOUTH DAILY. (Patient not taking: Reported on 07/31/2021), Disp: 180 tablet, Rfl: 1  Observations/Objective: Patient is well-developed, well-nourished in no acute distress.  Resting comfortably  at home.  Head is normocephalic, atraumatic.  No labored breathing.  Speech is clear and coherent with logical content.  Patient is alert and oriented at baseline.  Nasal congestion  Lot of mucinex   Assessment and Plan: 1. Acute bronchitis, unspecified organism - predniSONE (STERAPRED UNI-PAK 21 TAB) 10 MG (21) TBPK tablet; Use as directed  Dispense: 21 tablet; Refill: 0 - benzonatate (TESSALON) 200 MG capsule; Take 1 capsule (200 mg total) by mouth 2 (two) times daily as needed for cough.  Dispense: 20 capsule; Refill: 0  - Take meds as prescribed - Use a cool mist humidifier  -Use saline nose sprays frequently -Force fluids -For any cough or congestion  Use plain Mucinex- regular strength or max strength is fine -For fever or aces or pains- take tylenol or ibuprofen. -Throat lozenges if help -Work note given -Follow up symptoms worsen or do not improve   Follow Up Instructions: I discussed the assessment and treatment plan with the patient. The patient was provided an opportunity to ask questions and all were answered. The patient agreed with the plan and demonstrated an understanding of the instructions.  A copy of instructions were sent to the patient via MyChart unless otherwise noted below.     The patient was advised to call back or seek an in-person evaluation if the symptoms  worsen or if the condition fails to improve as anticipated.  Time:  I spent 8 minutes with the patient via telehealth technology discussing the above problems/concerns.    Rebecca Dun, FNP

## 2021-10-22 ENCOUNTER — Other Ambulatory Visit: Payer: Self-pay

## 2021-10-29 ENCOUNTER — Other Ambulatory Visit: Payer: Self-pay

## 2022-03-02 ENCOUNTER — Emergency Department (HOSPITAL_BASED_OUTPATIENT_CLINIC_OR_DEPARTMENT_OTHER): Payer: Medicaid Other

## 2022-03-02 ENCOUNTER — Encounter (HOSPITAL_BASED_OUTPATIENT_CLINIC_OR_DEPARTMENT_OTHER): Payer: Self-pay | Admitting: Emergency Medicine

## 2022-03-02 ENCOUNTER — Other Ambulatory Visit: Payer: Self-pay

## 2022-03-02 ENCOUNTER — Emergency Department (HOSPITAL_BASED_OUTPATIENT_CLINIC_OR_DEPARTMENT_OTHER)
Admission: EM | Admit: 2022-03-02 | Discharge: 2022-03-02 | Disposition: A | Discharge status: Home / Self Care | Payer: Medicaid Other | Attending: Emergency Medicine | Admitting: Emergency Medicine

## 2022-03-02 DIAGNOSIS — K529 Noninfective gastroenteritis and colitis, unspecified: Secondary | ICD-10-CM | POA: Diagnosis not present

## 2022-03-02 DIAGNOSIS — R509 Fever, unspecified: Secondary | ICD-10-CM | POA: Diagnosis present

## 2022-03-02 DIAGNOSIS — R059 Cough, unspecified: Secondary | ICD-10-CM | POA: Diagnosis not present

## 2022-03-02 DIAGNOSIS — Z1152 Encounter for screening for COVID-19: Secondary | ICD-10-CM | POA: Insufficient documentation

## 2022-03-02 LAB — COMPREHENSIVE METABOLIC PANEL
ALT: 17 U/L (ref 0–44)
AST: 21 U/L (ref 15–41)
Albumin: 3.7 g/dL (ref 3.5–5.0)
Alkaline Phosphatase: 93 U/L (ref 38–126)
Anion gap: 6 (ref 5–15)
BUN: 6 mg/dL (ref 6–20)
CO2: 25 mmol/L (ref 22–32)
Calcium: 8.5 mg/dL — ABNORMAL LOW (ref 8.9–10.3)
Chloride: 103 mmol/L (ref 98–111)
Creatinine, Ser: 0.84 mg/dL (ref 0.44–1.00)
GFR, Estimated: >60 mL/min (ref >60)
Glucose, Bld: 131 mg/dL — ABNORMAL HIGH (ref 70–99)
Potassium: 3.7 mmol/L (ref 3.5–5.1)
Sodium: 134 mmol/L — ABNORMAL LOW (ref 135–145)
Total Bilirubin: 0.7 mg/dL (ref 0.3–1.2)
Total Protein: 7.2 g/dL (ref 6.5–8.1)

## 2022-03-02 LAB — RESP PANEL BY RT-PCR (RSV, FLU A&B, COVID)  RVPGX2
Influenza A by PCR: NEGATIVE
Influenza B by PCR: NEGATIVE
Resp Syncytial Virus by PCR: NEGATIVE
SARS Coronavirus 2 by RT PCR: NEGATIVE

## 2022-03-02 LAB — CBC WITH DIFFERENTIAL/PLATELET
Abs Immature Granulocytes: 0.03 10*3/uL (ref 0.00–0.07)
Basophils Absolute: 0 10*3/uL (ref 0.0–0.1)
Basophils Relative: 0 %
Eosinophils Absolute: 0.3 10*3/uL (ref 0.0–0.5)
Eosinophils Relative: 3 %
HCT: 37.3 % (ref 36.0–46.0)
Hemoglobin: 12.2 g/dL (ref 12.0–15.0)
Immature Granulocytes: 0 %
Lymphocytes Relative: 33 %
Lymphs Abs: 2.5 10*3/uL (ref 0.7–4.0)
MCH: 30 pg (ref 26.0–34.0)
MCHC: 32.7 g/dL (ref 30.0–36.0)
MCV: 91.9 fL (ref 80.0–100.0)
Monocytes Absolute: 0.8 10*3/uL (ref 0.1–1.0)
Monocytes Relative: 10 %
Neutro Abs: 4 10*3/uL (ref 1.7–7.7)
Neutrophils Relative %: 54 %
Platelets: 293 10*3/uL (ref 150–400)
RBC: 4.06 MIL/uL (ref 3.87–5.11)
RDW: 14.4 % (ref 11.5–15.5)
WBC: 7.6 10*3/uL (ref 4.0–10.5)
nRBC: 0 % (ref 0.0–0.2)

## 2022-03-02 LAB — URINALYSIS, ROUTINE W REFLEX MICROSCOPIC
Bilirubin Urine: NEGATIVE
Glucose, UA: NEGATIVE mg/dL
Ketones, ur: NEGATIVE mg/dL
Nitrite: NEGATIVE
Protein, ur: NEGATIVE mg/dL
Specific Gravity, Urine: 1.015 (ref 1.005–1.030)
pH: 7 (ref 5.0–8.0)

## 2022-03-02 LAB — URINALYSIS, MICROSCOPIC (REFLEX)

## 2022-03-02 LAB — PREGNANCY, URINE: Preg Test, Ur: NEGATIVE

## 2022-03-02 MED ORDER — ONDANSETRON 4 MG PO TBDP: ORAL_TABLET | ORAL | 0 refills | Status: DC

## 2022-03-02 MED ORDER — SODIUM CHLORIDE 0.9 % IV BOLUS
1000.0000 mL | Freq: Once | INTRAVENOUS | Status: AC
  Administered 2022-03-02: 1000 mL via INTRAVENOUS

## 2022-03-02 NOTE — ED Triage Notes (Signed)
Pt w/ body aches, fever, diarrhea since Thurs
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
## 2022-03-02 NOTE — ED Provider Notes (Signed)
[facility] Provider Note   CSN: [id number] Arrival date & time: 03/02/22  1450     History  Chief Complaint  Patient presents with   Generalized Body Aches    Rebecca Spence is a 42 y.o. female here presenting with bodyaches and fever and diarrhea.  Patient states that her son was diagnosed with the flu last week.  She states that she had a fever of 102 2 days ago.  She states that she also has some nonproductive cough and nausea and vomiting and diarrhea.  She states that she just feels really weak today.  Denies any fever since yesterday.  The history is provided by the patient.       Home Medications Prior to Admission medications   Medication Sig Start Date End Date Taking? Authorizing Provider  acetaminophen (TYLENOL) 325 MG tablet Take 650 mg by mouth every 6 (six) hours as needed.    [provider]  albuterol (VENTOLIN HFA) 108 (90 Base) MCG/ACT inhaler Inhale 2 puffs into the lungs every 6 (six) hours as needed for wheezing or shortness of breath. 11/23/20   Mar Daring, PA-C  amLODipine (NORVASC) 10 MG tablet TAKE 1 TABLET (10 MG TOTAL) BY MOUTH DAILY. 02/07/21 02/07/22  Kerin Perna, NP  benzonatate (TESSALON) 100 MG capsule Take 2 capsules (200 mg total) by mouth 2 (two) times daily as needed for cough. 10/21/21   Evelina Dun A, FNP  FLUoxetine (PROZAC) 20 MG tablet TAKE 2 TABLETS (40 MG TOTAL) BY MOUTH DAILY. Patient not taking: Reported on 07/31/2021 02/07/21 02/07/22  Kerin Perna, NP  predniSONE (STERAPRED UNI-PAK 21 TAB) 10 MG (21) TBPK tablet Use as directed 10/21/21   Sharion Balloon, FNP      Allergies    Orange, Peach [prunus persica], and Fentanyl    Review of Systems   Review of Systems  Gastrointestinal:  Positive for diarrhea and vomiting.  All other systems reviewed and are negative.   Physical Exam Updated Vital Signs BP (!) 160/102 (BP Location: Right Arm)   Pulse  87   Temp 98.4 F (36.9 C) (Oral)   Resp 18   Ht 5' 6"$  (1.676 m)   Wt 95.3 kg   LMP 02/20/2022 (Approximate)   SpO2 95%   BMI 33.89 kg/m  Physical Exam Vitals and nursing note reviewed.  Constitutional:      Comments: Slightly dehydrated  HENT:     Head: Normocephalic.     Mouth/Throat:     Mouth: Mucous membranes are dry.  Eyes:     Extraocular Movements: Extraocular movements intact.     Pupils: Pupils are equal, round, and reactive to light.  Cardiovascular:     Rate and Rhythm: Normal rate and regular rhythm.     Pulses: Normal pulses.     Heart sounds: Normal heart sounds.  Pulmonary:     Effort: Pulmonary effort is normal.     Breath sounds: Normal breath sounds.  Abdominal:     General: Abdomen is flat.     Palpations: Abdomen is soft.  Musculoskeletal:        General: Normal range of motion.     Cervical back: Normal range of motion and neck supple.  Skin:    General: Skin is warm.     Capillary Refill: Capillary refill takes less than 2 seconds.  Neurological:     General: No focal deficit present.     Mental Status:  She is oriented to person, place, and time.  Psychiatric:        Mood and Affect: Mood normal.        Behavior: Behavior normal.     ED Results / Procedures / Treatments   Labs (all labs ordered are listed, but only abnormal results are displayed) Labs Reviewed  RESP PANEL BY RT-PCR (RSV, FLU A&B, COVID)  [id number]  CBC WITH DIFFERENTIAL/PLATELET  COMPREHENSIVE METABOLIC PANEL  URINALYSIS, ROUTINE W REFLEX MICROSCOPIC  PREGNANCY, URINE    EKG None  Radiology No results found.  Procedures Procedures    Medications Ordered in ED Medications  sodium chloride 0.9 % bolus 1,000 mL (has no administration in time range)    ED Course/ Medical Decision Making/ A&P                             Medical Decision Making Rebecca Spence is a 42 y.o. female here presenting with vomiting and diarrhea and possible flu exposure.   Differential includes viral gastroenteritis versus COVID versus flu versus viral syndrome.  Plan to get CBC and CMP and COVID and flu and RSV and hydrate patient and reassess.  4:55 PM Reviewed patient's. Interpreted the chest x-ray.  Patient's white blood cell count is normal.  Chemistry is unremarkable.  UA showed no obvious UTI.  COVID and flu are negative.  Chest x-ray is clear.  Patient likely has viral gastroenteritis.  Stable for discharge at this point.  Problems Addressed: Gastroenteritis: acute illness or injury  Amount and/or Complexity of Data Reviewed Labs: ordered. Decision-making details documented in ED Course. Radiology: ordered and independent interpretation performed. Decision-making details documented in ED Course.    Final Clinical Impression(s) / ED Diagnoses Final diagnoses:  None    Rx / DC Orders ED Discharge Orders     None         Drenda Freeze, MD 03/02/22 [phone]

## 2022-03-02 NOTE — Discharge Instructions (Addendum)
Take zofran for nausea. Stay hydrated   Your COVID and flu and RSV is negative   See your doctor for follow up   Rest for 2 days  Return to ER if you have worse abdominal pain or vomiting or dehydration

## 2022-04-23 ENCOUNTER — Telehealth: Payer: Self-pay | Admitting: Primary Care

## 2022-04-23 NOTE — Telephone Encounter (Signed)
Copied from [place] [id number]. Topic: General - Inquiry >> Apr 23, 2022  1:47 PM Marcellus Scott wrote: Reason for CRM: Pt is requesting a callback from Ms. Edwards, directly stated she needs to schedule an appointment on her day off for Friday, and she has not been taking any of her medications. I offered to help the patient with a refill request; she stated she takes a lot of medication, and Ms.Oletta Lamas may be changing this, so she needs to speak with her; she usually does call her back. Pt also mentioned her mind isn't right and has a lot going on. Pt declined assistance and requested a callback from PCP.  Please advise.

## 2022-04-23 NOTE — Telephone Encounter (Signed)
Pt has been contacted and has been scheduled an appt for 04/26/22

## 2022-04-26 ENCOUNTER — Ambulatory Visit (INDEPENDENT_AMBULATORY_CARE_PROVIDER_SITE_OTHER): Payer: Medicaid Other | Admitting: Primary Care

## 2022-04-26 ENCOUNTER — Telehealth (INDEPENDENT_AMBULATORY_CARE_PROVIDER_SITE_OTHER): Payer: Self-pay | Admitting: Primary Care

## 2022-04-26 NOTE — Telephone Encounter (Signed)
Pt was called to reschedule missed appointment

## 2022-07-14 ENCOUNTER — Emergency Department (HOSPITAL_COMMUNITY)
Admission: EM | Admit: 2022-07-14 | Discharge: 2022-07-15 | Disposition: A | Discharge status: Home / Self Care | Payer: Medicaid Other | Attending: Emergency Medicine | Admitting: Emergency Medicine

## 2022-07-14 ENCOUNTER — Emergency Department (HOSPITAL_COMMUNITY): Payer: Medicaid Other

## 2022-07-14 DIAGNOSIS — R0789 Other chest pain: Secondary | ICD-10-CM | POA: Insufficient documentation

## 2022-07-14 DIAGNOSIS — I10 Essential (primary) hypertension: Secondary | ICD-10-CM | POA: Insufficient documentation

## 2022-07-14 DIAGNOSIS — E119 Type 2 diabetes mellitus without complications: Secondary | ICD-10-CM | POA: Insufficient documentation

## 2022-07-14 DIAGNOSIS — Z79899 Other long term (current) drug therapy: Secondary | ICD-10-CM | POA: Diagnosis not present

## 2022-07-14 DIAGNOSIS — R519 Headache, unspecified: Secondary | ICD-10-CM | POA: Diagnosis not present

## 2022-07-14 DIAGNOSIS — F419 Anxiety disorder, unspecified: Secondary | ICD-10-CM | POA: Diagnosis not present

## 2022-07-14 DIAGNOSIS — F410 Panic disorder [episodic paroxysmal anxiety] without agoraphobia: Secondary | ICD-10-CM

## 2022-07-14 DIAGNOSIS — R200 Anesthesia of skin: Secondary | ICD-10-CM | POA: Diagnosis present

## 2022-07-14 DIAGNOSIS — R079 Chest pain, unspecified: Secondary | ICD-10-CM | POA: Diagnosis not present

## 2022-07-14 LAB — BASIC METABOLIC PANEL
Anion gap: 11 (ref 5–15)
BUN: 8 mg/dL (ref 6–20)
CO2: 26 mmol/L (ref 22–32)
Calcium: 9.4 mg/dL (ref 8.9–10.3)
Chloride: 100 mmol/L (ref 98–111)
Creatinine, Ser: 1.05 mg/dL — ABNORMAL HIGH (ref 0.44–1.00)
GFR, Estimated: >60 mL/min (ref >60)
Glucose, Bld: 115 mg/dL — ABNORMAL HIGH (ref 70–99)
Potassium: 4 mmol/L (ref 3.5–5.1)
Sodium: 137 mmol/L (ref 135–145)

## 2022-07-14 LAB — CBC
HCT: 44.9 % (ref 36.0–46.0)
Hemoglobin: 14.5 g/dL (ref 12.0–15.0)
MCH: 29.9 pg (ref 26.0–34.0)
MCHC: 32.3 g/dL (ref 30.0–36.0)
MCV: 92.6 fL (ref 80.0–100.0)
Platelets: 379 10*3/uL (ref 150–400)
RBC: 4.85 MIL/uL (ref 3.87–5.11)
RDW: 14.9 % (ref 11.5–15.5)
WBC: 10.8 10*3/uL — ABNORMAL HIGH (ref 4.0–10.5)
nRBC: 0 % (ref 0.0–0.2)

## 2022-07-14 LAB — TROPONIN I (HIGH SENSITIVITY): Troponin I (High Sensitivity): <2 ng/L (ref <18)

## 2022-07-14 LAB — I-STAT BETA HCG BLOOD, ED (MC, WL, AP ONLY): I-stat hCG, quantitative: <5 m[IU]/mL (ref <5)

## 2022-07-14 MED ORDER — ONDANSETRON HCL 4 MG/2ML IJ SOLN
4.0000 mg | Freq: Once | INTRAMUSCULAR | Status: AC
  Administered 2022-07-14: 4 mg via INTRAVENOUS
  Filled 2022-07-14: qty 2

## 2022-07-14 MED ORDER — LORAZEPAM 2 MG/ML IJ SOLN: 1.0000 mg | Freq: Once | INTRAMUSCULAR | Status: DC

## 2022-07-14 NOTE — ED Provider Notes (Signed)
[place] [facility] AT [facility] Provider Note   CSN: [id number] Arrival date & time: 07/14/22  2055     History  Chief Complaint  Patient presents with   Chest Pain   Headache   Hypertension    Rebecca Spence is a 42 y.o. female with a history of hypertension, type 2 diabetes mellitus, and hyperlipidemia who presents to the ED today for chest pain. She reports intermittent chest pain, headache to the top of her head, and right sided upper and lower extremity tingling and numbness. Her chest pain is sharp and localized to the center of her chest. She reports that she has been out of her blood pressure and diabetes medications for weeks but she is still taking her anxiety and depression medications. Patient reports that her symptoms come about randomly and last about 15 minutes, then go away on there own. No fever, nausea, vomiting, abdominal pain, urinary symptoms or changes to her bowel habits. She denies any suicidal ideations. No other complaints or concerns at this time.    Home Medications Prior to Admission medications   Medication Sig Start Date End Date Taking? Authorizing Provider  albuterol (VENTOLIN HFA) 108 (90 Base) MCG/ACT inhaler Inhale 2 puffs into the lungs every 6 (six) hours as needed for wheezing or shortness of breath. 11/23/20  Yes Margaretann Loveless, PA-C  amLODipine (NORVASC) 10 MG tablet Take 10 mg by mouth daily.   Yes [provider]  FLUoxetine (PROZAC) 20 MG tablet Take 40 mg by mouth daily.   Yes [provider]  amLODipine (NORVASC) 10 MG tablet TAKE 1 TABLET (10 MG TOTAL) BY MOUTH DAILY. 02/07/21 02/07/22  Grayce Sessions, NP  benzonatate (TESSALON) 100 MG capsule Take 2 capsules (200 mg total) by mouth 2 (two) times daily as needed for cough. Patient not taking: Reported on 07/15/2022 10/21/21   Jannifer Rodney A, FNP  FLUoxetine (PROZAC) 20 MG tablet TAKE 2 TABLETS (40 MG TOTAL) BY MOUTH DAILY. Patient not  taking: Reported on 07/31/2021 02/07/21 02/07/22  Grayce Sessions, NP  ondansetron (ZOFRAN-ODT) 4 MG disintegrating tablet 4mg  ODT q4 hours prn nausea/vomit Patient not taking: Reported on 07/15/2022 03/02/22   Charlynne Pander, MD  predniSONE (STERAPRED UNI-PAK 21 TAB) 10 MG (21) TBPK tablet Use as directed Patient not taking: Reported on 07/15/2022 10/21/21   Junie Spencer, FNP      Allergies    Erskine Emery, Peach [prunus persica], and Fentanyl    Review of Systems   Review of Systems  Cardiovascular:  Positive for chest pain.  Neurological:  Positive for headaches.  All other systems reviewed and are negative.   Physical Exam Updated Vital Signs BP (!) 169/106 (BP Location: Left Arm)   Pulse 72   Temp 98.6 F (37 C) (Oral)   Resp 16   Ht 5\' 7"  (1.702 m)   Wt 85.3 kg   LMP 06/30/2022 (Approximate)   SpO2 100%   BMI 29.44 kg/m  Physical Exam Vitals and nursing note reviewed.  Constitutional:      General: She is in acute distress.     Appearance: Normal appearance.  HENT:     Head: Normocephalic and atraumatic.     Mouth/Throat:     Mouth: Mucous membranes are moist.  Eyes:     Conjunctiva/sclera: Conjunctivae normal.     Pupils: Pupils are equal, round, and reactive to light.  Cardiovascular:     Rate and Rhythm: Normal rate and regular  rhythm.     Pulses: Normal pulses.     Heart sounds: Normal heart sounds.  Pulmonary:     Effort: Pulmonary effort is normal.     Breath sounds: Normal breath sounds.  Abdominal:     Palpations: Abdomen is soft.     Tenderness: There is no abdominal tenderness.  Skin:    General: Skin is warm and dry.     Capillary Refill: Capillary refill takes less than 2 seconds.     Findings: No rash.  Neurological:     General: No focal deficit present.     Mental Status: She is alert.  Psychiatric:        Mood and Affect: Mood normal.        Behavior: Behavior normal.    During one of patient's episodes where she was feeling  numbness and tingling in her right arm and leg but she was able to ambulate from the floor to her bed by herself. ED Results / Procedures / Treatments   Labs (all labs ordered are listed, but only abnormal results are displayed) Labs Reviewed  BASIC METABOLIC PANEL - Abnormal; Notable for the following components:      Result Value   Glucose, Bld 115 (*)    Creatinine, Ser 1.05 (*)    All other components within normal limits  CBC - Abnormal; Notable for the following components:   WBC 10.8 (*)    All other components within normal limits  I-STAT BETA HCG BLOOD, ED ([facility], [facility], [facility] ONLY)  TROPONIN I (HIGH SENSITIVITY)  TROPONIN I (HIGH SENSITIVITY)    EKG EKG Interpretation  Date/Time:  Sunday July 14 2022 21:13:23 EDT Ventricular Rate:  81 PR Interval:  162 QRS Duration: 92 QT Interval:  391 QTC Calculation: 454 R Axis:   46 Text Interpretation: Sinus rhythm Consider left ventricular hypertrophy Borderline T abnormalities, inferior leads Baseline wander in lead(s) III Confirmed by Alvino Blood ([id number]) on 07/14/2022 9:23:02 PM  Radiology CT Head Wo Contrast  Result Date: 07/14/2022 CLINICAL DATA:  Headache EXAM: CT HEAD WITHOUT CONTRAST TECHNIQUE: Contiguous axial images were obtained from the base of the skull through the vertex without intravenous contrast. RADIATION DOSE REDUCTION: This exam was performed according to the departmental dose-optimization program which includes automated exposure control, adjustment of the mA and/or kV according to patient size and/or use of iterative reconstruction technique. COMPARISON:  CT head 05/10/2002 report FINDINGS: Brain: No intracranial hemorrhage, mass effect, or evidence of acute infarct. No hydrocephalus. No extra-axial fluid collection. Vascular: No hyperdense vessel or unexpected calcification. Skull: No fracture or focal lesion. Sinuses/Orbits: No acute finding. Paranasal sinuses and mastoid air cells are well aerated. Other: None.  IMPRESSION: No acute intracranial process. Electronically Signed   By: Minerva Fester M.D.   On: 07/14/2022 23:47   DG Chest 2 View  Result Date: 07/14/2022 CLINICAL DATA:  Chest pain, headache EXAM: CHEST - 2 VIEW COMPARISON:  03/02/2022 FINDINGS: The heart size and mediastinal contours are within normal limits. Both lungs are clear. The visualized skeletal structures are unremarkable. IMPRESSION: No active cardiopulmonary disease. Electronically Signed   By: Sharlet Salina M.D.   On: 07/14/2022 21:34    Procedures Procedures: not indicated.   Medications Ordered in ED Medications  LORazepam (ATIVAN) injection 1 mg (0 mg Intravenous Hold 07/14/22 2335)  ondansetron (ZOFRAN) injection 4 mg (4 mg Intravenous Given 07/14/22 2248)    ED Course/ Medical Decision Making/ A&P  Medical Decision Making  This patient presents to the ED for concern of chest pain, this involves an extensive number of treatment options, and is a complaint that carries with it a high risk of complications and morbidity.   Differential diagnosis includes: ACS, pneumonia, pneumothorax, atypical chest pain, panic attack, etc.   Co morbidities that complicate the patient evaluation  Anxiety Hypertension Diabetes mellitus   Additional history obtained:  Additional history obtained from patient's records.   Cardiac Monitoring / EKG:  The patient was maintained on a cardiac monitor.  I personally viewed and interpreted the cardiac monitored which showed: sinus rhythm with a heart rate of 81 bpm.   Lab Tests:  I ordered and personally interpreted labs - no electrolyte abnormalities, acute anemia or infection.   Imaging Studies ordered:  I ordered imaging studies including CXR and CT head non-contrast.  I independently visualized and interpreted imaging which showed: CXR - no active cardiopulmonary disease CT head - no acute intracranial processes. I agree with the  radiologist interpretation   Problem List / ED Course / Critical interventions / Medication management  Intermittent chest pain and headache I ordered medications including: Zofran for nausea  Reevaluation of the patient after these medicines showed that the patient improved I have reviewed the patients home medicines and have made adjustments as needed   Social Determinants of Health:  Access to healthcare   Test / Admission - Considered:  Discussed results with patient. Instructed to alternate between Tylenol and Ibuprofen every 4 hours for pain as needed. Patient is stable and safe for discharge home.        Final Clinical Impression(s) / ED Diagnoses Final diagnoses:  Anxiety attack  Atypical chest pain    Rx / DC Orders ED Discharge Orders     None         Maxwell Marion, PA-C 07/15/22 0047    Lonell Grandchild, MD 07/19/22 [phone]

## 2022-07-14 NOTE — ED Triage Notes (Signed)
Patient here from home reporting ongoing chest pain, headache, and tingling on the right side x3 days. Out of BP meds for 1 month.

## 2022-07-14 NOTE — ED Provider Notes (Incomplete)
[place] [facility] AT [facility] Provider Note   CSN: [id number] Arrival date & time: 07/14/22  2055     History {Add pertinent medical, surgical, social history, OB history to HPI:1} Chief Complaint  Patient presents with  . Chest Pain  . Headache  . Hypertension    Rebecca Spence is a 42 y.o. female with a history of hypertension, type 2 diabetes mellitus, and hyperlipidemia who presents to the ED today for chest pain.    Chest Pain Associated symptoms: headache   Headache Hypertension Associated symptoms include chest pain and headaches.       Home Medications Prior to Admission medications   Medication Sig Start Date End Date Taking? Authorizing Provider  acetaminophen (TYLENOL) 325 MG tablet Take 650 mg by mouth every 6 (six) hours as needed.    [provider]  albuterol (VENTOLIN HFA) 108 (90 Base) MCG/ACT inhaler Inhale 2 puffs into the lungs every 6 (six) hours as needed for wheezing or shortness of breath. 11/23/20   Margaretann Loveless, PA-C  amLODipine (NORVASC) 10 MG tablet TAKE 1 TABLET (10 MG TOTAL) BY MOUTH DAILY. 02/07/21 02/07/22  Grayce Sessions, NP  benzonatate (TESSALON) 100 MG capsule Take 2 capsules (200 mg total) by mouth 2 (two) times daily as needed for cough. 10/21/21   Jannifer Rodney A, FNP  FLUoxetine (PROZAC) 20 MG tablet TAKE 2 TABLETS (40 MG TOTAL) BY MOUTH DAILY. Patient not taking: Reported on 07/31/2021 02/07/21 02/07/22  Grayce Sessions, NP  ondansetron (ZOFRAN-ODT) 4 MG disintegrating tablet 4mg  ODT q4 hours prn nausea/vomit 03/02/22   Charlynne Pander, MD  predniSONE (STERAPRED UNI-PAK 21 TAB) 10 MG (21) TBPK tablet Use as directed 10/21/21   Junie Spencer, FNP      Allergies    Orange, Peach [prunus persica], and Fentanyl    Review of Systems   Review of Systems  Cardiovascular:  Positive for chest pain.  Neurological:  Positive for headaches.  All other systems reviewed and are  negative.   Physical Exam Updated Vital Signs BP (!) 159/116 (BP Location: Right Arm)   Pulse 79   Temp 98.3 F (36.8 C) (Oral)   Resp 19   Ht 5\' 7"  (1.702 m)   Wt 85.3 kg   LMP 06/30/2022 (Approximate)   SpO2 100%   BMI 29.44 kg/m  Physical Exam Vitals and nursing note reviewed.  Constitutional:      General: She is in acute distress.     Appearance: Normal appearance.  HENT:     Head: Normocephalic and atraumatic.     Mouth/Throat:     Mouth: Mucous membranes are moist.  Eyes:     Conjunctiva/sclera: Conjunctivae normal.     Pupils: Pupils are equal, round, and reactive to light.  Cardiovascular:     Rate and Rhythm: Normal rate and regular rhythm.     Pulses: Normal pulses.     Heart sounds: Normal heart sounds.  Pulmonary:     Effort: Pulmonary effort is normal.     Breath sounds: Normal breath sounds.  Abdominal:     Palpations: Abdomen is soft.     Tenderness: There is no abdominal tenderness.  Skin:    General: Skin is warm and dry.     Capillary Refill: Capillary refill takes less than 2 seconds.     Findings: No rash.  Neurological:     General: No focal deficit present.     Mental Status: She is  alert.  Psychiatric:        Mood and Affect: Mood normal.        Behavior: Behavior normal.     ED Results / Procedures / Treatments   Labs (all labs ordered are listed, but only abnormal results are displayed) Labs Reviewed  BASIC METABOLIC PANEL  CBC  I-STAT BETA HCG BLOOD, ED ([facility], [facility], [facility] ONLY)  TROPONIN I (HIGH SENSITIVITY)    EKG EKG Interpretation  Date/Time:  Sunday July 14 2022 21:13:23 EDT Ventricular Rate:  81 PR Interval:  162 QRS Duration: 92 QT Interval:  391 QTC Calculation: 454 R Axis:   46 Text Interpretation: Sinus rhythm Consider left ventricular hypertrophy Borderline T abnormalities, inferior leads Baseline wander in lead(s) III Confirmed by Alvino Blood ([id number]) on 07/14/2022 9:23:02 PM  Radiology DG Chest 2  View  Result Date: 07/14/2022 CLINICAL DATA:  Chest pain, headache EXAM: CHEST - 2 VIEW COMPARISON:  03/02/2022 FINDINGS: The heart size and mediastinal contours are within normal limits. Both lungs are clear. The visualized skeletal structures are unremarkable. IMPRESSION: No active cardiopulmonary disease. Electronically Signed   By: Sharlet Salina M.D.   On: 07/14/2022 21:34    Procedures Procedures: not indicated. {Document cardiac monitor, telemetry assessment procedure when appropriate:1}  Medications Ordered in ED Medications - No data to display  ED Course/ Medical Decision Making/ A&P   {   Click here for ABCD2, HEART and other calculatorsREFRESH Note before signing :1}                          Medical Decision Making  This patient presents to the ED for concern of ***, this involves an extensive number of treatment options, and is a complaint that carries with it a high risk of complications and morbidity.   Differential diagnosis includes: ***   Co morbidities that complicate the patient evaluation  ***   Additional history obtained:  Additional history obtained from *** External records from outside source obtained and reviewed including ***   Cardiac Monitoring / EKG:  The patient was maintained on a cardiac monitor.  I personally viewed and interpreted the cardiac monitored which showed: *** with a heart rate of *** bpm.   Lab Tests:  I ordered and personally interpreted labs.  The pertinent results include:  ***   Imaging Studies ordered:  I ordered imaging studies including ***  I independently visualized and interpreted imaging which showed *** I agree with the radiologist interpretation   Consultations Obtained:  I requested consultation with ***,  and discussed lab and imaging findings as well as pertinent plan - they recommend: ***   Problem List / ED Course / Critical interventions / Medication management  *** I ordered medications  including:  ***  for ***  Reevaluation of the patient after these medicines showed that the patient {resolved/improved/worsened:23923::"improved"} I have reviewed the patients home medicines and have made adjustments as needed   Social Determinants of Health:  ***   Test / Admission - Considered:  ***   {Document critical care time when appropriate:1} {Document review of labs and clinical decision tools ie heart score, Chads2Vasc2 etc:1}  {Document your independent review of radiology images, and any outside records:1} {Document your discussion with family members, caretakers, and with consultants:1} {Document social determinants of health affecting pt's care:1} {Document your decision making why or why not admission, treatments were needed:1} Final Clinical Impression(s) / ED Diagnoses Final diagnoses:  None  Rx / DC Orders ED Discharge Orders     None

## 2022-07-15 ENCOUNTER — Telehealth: Payer: Medicaid Other

## 2022-07-15 LAB — TROPONIN I (HIGH SENSITIVITY): Troponin I (High Sensitivity): <2 ng/L (ref <18)

## 2022-07-15 NOTE — Discharge Instructions (Signed)
Your results show no injury to your heart. Alternate between Tylenol and Ibuprofen every 4 hours as needed for pain.  Follow up with your PCP in 48 hours for reevaluation and to refill your hypertension and diabetes medications. If you do not have one, call the number on your discharge paperwork to help get established with one.  Get help right away if: Your chest pain is worse. You have a cough that gets worse, or you cough up blood. You have very bad (severe) pain in your belly (abdomen). You pass out (faint). You have either of these for no clear reason: Sudden chest discomfort. Sudden discomfort in your arms, back, neck, or jaw. You have shortness of breath at any time. You suddenly start to sweat, or your skin gets clammy. You feel sick to your stomach (nauseous). You throw up (vomit). You suddenly feel lightheaded or dizzy. You feel very weak or tired. Your heart starts to beat fast, or it feels like it is skipping beats.

## 2022-07-18 ENCOUNTER — Encounter (INDEPENDENT_AMBULATORY_CARE_PROVIDER_SITE_OTHER): Payer: Self-pay | Admitting: Primary Care

## 2022-07-18 ENCOUNTER — Other Ambulatory Visit: Payer: Self-pay

## 2022-07-18 ENCOUNTER — Ambulatory Visit (INDEPENDENT_AMBULATORY_CARE_PROVIDER_SITE_OTHER): Payer: Medicaid Other | Admitting: Primary Care

## 2022-07-18 VITALS — BP 165/114 | HR 85 | Ht 67.0 in | Wt 224.2 lb

## 2022-07-18 DIAGNOSIS — Z131 Encounter for screening for diabetes mellitus: Secondary | ICD-10-CM

## 2022-07-18 DIAGNOSIS — Z09 Encounter for follow-up examination after completed treatment for conditions other than malignant neoplasm: Secondary | ICD-10-CM

## 2022-07-18 DIAGNOSIS — I10 Essential (primary) hypertension: Secondary | ICD-10-CM | POA: Diagnosis not present

## 2022-07-18 DIAGNOSIS — E785 Hyperlipidemia, unspecified: Secondary | ICD-10-CM

## 2022-07-18 MED ORDER — AMLODIPINE BESYLATE 10 MG PO TABS
10.0000 mg | ORAL_TABLET | Freq: Every day | ORAL | 1 refills | Status: DC
  Filled 2022-07-18: qty 30, 30d supply, fill #0
  Filled 2023-05-09: qty 30, 30d supply, fill #1
  Filled 2023-06-11: qty 30, 30d supply, fill #2
  Filled 2023-07-07: qty 30, 30d supply, fill #3

## 2022-07-18 MED ORDER — AMLODIPINE BESYLATE 10 MG PO TABS
ORAL_TABLET | Freq: Every day | ORAL | 3 refills | Status: DC
  Filled 2022-07-18: qty 90, fill #0

## 2022-07-18 MED ORDER — FLUOXETINE HCL 20 MG PO TABS
20.0000 mg | ORAL_TABLET | Freq: Every day | ORAL | 1 refills | Status: DC
  Filled 2022-07-18: qty 30, 30d supply, fill #0
  Filled 2023-05-10: qty 30, 30d supply, fill #1

## 2022-07-18 MED ORDER — LISINOPRIL-HYDROCHLOROTHIAZIDE 20-25 MG PO TABS
1.0000 | ORAL_TABLET | Freq: Every day | ORAL | 1 refills | Status: DC
  Filled 2022-07-18: qty 30, 30d supply, fill #0

## 2022-07-18 NOTE — Progress Notes (Signed)
Discuss recent ED visit Needs refills on medication, has been out.

## 2022-07-18 NOTE — Progress Notes (Signed)
[facility]   Subjective:  [name] is a 42 y.o. female presents for hospital follow up.  Patient present to ED intermittent chest pain, headache to the top of her head, and right sided upper and lower extremity tingling and numbness. Her chest pain is sharp and localized to the center of her chest.   Admit date to the hospital was 07/14/22, patient was discharged from the hospital on 07/15/22, patient was admitted for: Anxiety attack and.Atypical chest pain. Bp elevated due to lack of appt's and  personal problems. Patient has No headache, No chest pain, No abdominal pain - No Nausea, No new weakness tingling or numbness, No Cough - shortness of breath . Past Medical History:  Diagnosis Date   Anxiety    Bilateral ovarian cysts    Bipolar affective disorder, current episode mixed, current episode severity unspecified (HCC)    DM type 2 (diabetes mellitus, type 2) (HCC) 10/01/2016   Eczema 10/29/2016   Nose and hands/forearms   Essential hypertension 2016   Gestational diabetes    09/2016:  prediabetes   Headache(784.0)    Hyperlipidemia 10/01/2016   Prediabetes 10/01/2016   Eye exam with Rebecca Spence, M.D. negative for diabetic changes.   Schizophrenic disorder (HCC)      Allergies  Allergen Reactions   Orange Anaphylaxis and Hives    "My throat starts closing up, my tongue, lips started swelling & burning.  Similar reaction to onions.   Peach [Prunus Persica] Hives    Throat closes up, lips swell and burn   Fentanyl     RN witnessed patient having hives and lip swelling.       Current Outpatient Medications on File Prior to Visit  Medication Sig Dispense Refill   amLODipine (NORVASC) 10 MG tablet Take 10 mg by mouth daily.     FLUoxetine (PROZAC) 20 MG tablet Take 40 mg by mouth daily.     albuterol (VENTOLIN HFA) 108 (90 Base) MCG/ACT inhaler Inhale 2 puffs into the lungs every 6 (six) hours as needed for wheezing or shortness of breath. (Patient  not taking: Reported on 07/18/2022) 8 g 0   amLODipine (NORVASC) 10 MG tablet TAKE 1 TABLET (10 MG TOTAL) BY MOUTH DAILY. 90 tablet 3   benzonatate (TESSALON) 100 MG capsule Take 2 capsules (200 mg total) by mouth 2 (two) times daily as needed for cough. (Patient not taking: Reported on 07/15/2022) 40 capsule 0   FLUoxetine (PROZAC) 20 MG tablet TAKE 2 TABLETS (40 MG TOTAL) BY MOUTH DAILY. (Patient not taking: Reported on 07/31/2021) 180 tablet 1   ondansetron (ZOFRAN-ODT) 4 MG disintegrating tablet 4mg  ODT q4 hours prn nausea/vomit (Patient not taking: Reported on 07/15/2022) 10 tablet 0   predniSONE (STERAPRED UNI-PAK 21 TAB) 10 MG (21) TBPK tablet Use as directed (Patient not taking: Reported on 07/15/2022) 21 tablet 0   No current facility-administered medications on file prior to visit.     Review of System: Comprehensive ROS Pertinent positive and negative noted in HPI   Objective:  Blood Pressure (Abnormal) 165/114   Pulse 85   Height 5\' 7"  (1.702 m)   Weight 224 lb 3.2 oz (101.7 kg)   Last Menstrual Period 06/30/2022 (Approximate)   Oxygen Saturation 98%   Body Mass Index 35.11 kg/m   Filed Weights   07/18/22 0920  Weight: 224 lb 3.2 oz (101.7 kg)    Physical Exam: General Appearance: Well nourished, in no apparent distress. Eyes: PERRLA, EOMs, conjunctiva no  swelling or erythema Sinuses: No Frontal/maxillary tenderness ENT/Mouth: Ext aud canals clear, TMs without erythema, bulging.  Hearing normal.  Neck: Supple, thyroid normal.  Respiratory: Respiratory effort normal, BS equal bilaterally without rales, rhonchi, wheezing or stridor.  Cardio: RRR with no MRGs. Brisk peripheral pulses without edema.  Abdomen: Soft, + BS.  Non tender, no guarding, rebound, hernias, masses. Lymphatics: Non tender without lymphadenopathy.  Musculoskeletal: Full ROM, 5/5 strength, normal gait.  Skin: Warm, dry without rashes, lesions, ecchymosis.  Neuro: Cranial nerves intact. Normal muscle  tone, no cerebellar symptoms. Sensation intact.  Psych: Awake and oriented X 3, normal affect, Insight and Judgment appropriate.    Assessment:  Rebecca Spence was seen today for hospitalization follow-up.  Diagnoses and all orders for this visit:  Hyperlipidemia, unspecified hyperlipidemia type  Healthy lifestyle diet of fruits vegetables fish nuts whole grains and low saturated fat . Foods high in cholesterol or liver, fatty meats,cheese, butter avocados, nuts and seeds, chocolate and fried foods.  -     Lipid Panel  Essential hypertension BP goal - < 130/80 Explained that having normal blood pressure is the goal and medications are helping to get to goal and maintain normal blood pressure. DIET: Limit salt intake, read nutrition labels to check salt content, limit fried and high fatty foods  Avoid using multisymptom OTC cold preparations that generally contain sudafed which can rise BP. Consult with pharmacist on best cold relief products to use for persons with HTN EXERCISE Discussed incorporating exercise such as walking - 30 minutes most days of the week and can do in 10 minute intervals    -     amLODipine (NORVASC) 10 MG tablet; Take 1 tablet (10 mg total) by mouth daily. -     lisinopril-hydrochlorothiazide (ZESTORETIC) 20-25 MG tablet; Take 1 tablet by mouth daily.  Screening for diabetes mellitus (DM) -     Hemoglobin A1c  Hospital discharge follow-up  D/C instructions   Follow up with Grayce Sessions, NP (Internal Medicine) in 2 days (07/17/2022); For re-evaluation and to get your medications refilled   Other orders -     FLUoxetine (PROZAC) 20 MG tablet; Take 1 tablet (20 mg total) by mouth daily.   This note has been created with Education officer, environmental. Any transcriptional errors are unintentional.   Grayce Sessions, NP 07/18/2022, 9:42 AM

## 2022-07-19 LAB — LIPID PANEL
Chol/HDL Ratio: 6.4 ratio — ABNORMAL HIGH (ref 0.0–4.4)
Cholesterol, Total: 238 mg/dL — ABNORMAL HIGH (ref 100–199)
HDL: 37 mg/dL — ABNORMAL LOW (ref >39)
LDL Chol Calc (NIH): 123 mg/dL — ABNORMAL HIGH (ref 0–99)
Triglycerides: 437 mg/dL — ABNORMAL HIGH (ref 0–149)
VLDL Cholesterol Cal: 78 mg/dL — ABNORMAL HIGH (ref 5–40)

## 2022-07-19 LAB — HEMOGLOBIN A1C
Est. average glucose Bld gHb Est-mCnc: 143 mg/dL
Hgb A1c MFr Bld: 6.6 % — ABNORMAL HIGH (ref 4.8–5.6)

## 2022-07-22 ENCOUNTER — Other Ambulatory Visit: Payer: Self-pay

## 2022-07-23 ENCOUNTER — Other Ambulatory Visit: Payer: Self-pay

## 2022-07-30 ENCOUNTER — Ambulatory Visit (INDEPENDENT_AMBULATORY_CARE_PROVIDER_SITE_OTHER): Payer: Medicaid Other | Admitting: Primary Care

## 2022-07-31 ENCOUNTER — Ambulatory Visit (INDEPENDENT_AMBULATORY_CARE_PROVIDER_SITE_OTHER): Payer: Medicaid Other | Admitting: Primary Care

## 2022-09-17 ENCOUNTER — Telehealth: Payer: Medicaid Other | Admitting: Physician Assistant

## 2022-09-17 DIAGNOSIS — Z5321 Procedure and treatment not carried out due to patient leaving prior to being seen by health care provider: Secondary | ICD-10-CM

## 2022-09-17 DIAGNOSIS — U071 COVID-19: Secondary | ICD-10-CM | POA: Diagnosis not present

## 2022-09-17 MED ORDER — BENZONATATE 100 MG PO CAPS: 100.0000 mg | ORAL_CAPSULE | Freq: Three times a day (TID) | ORAL | 0 refills | Status: DC | PRN

## 2022-09-17 MED ORDER — NIRMATRELVIR/RITONAVIR (PAXLOVID)TABLET: 3.0000 | ORAL_TABLET | Freq: Two times a day (BID) | ORAL | 0 refills | Status: AC

## 2022-09-17 NOTE — Progress Notes (Signed)
Virtual Visit Consent   Rebecca Spence, you are scheduled for a virtual visit with a [facility] provider today. Just as with appointments in the office, your consent must be obtained to participate. Your consent will be active for this visit and any virtual visit you may have with one of our providers in the next 365 days. If you have a MyChart account, a copy of this consent can be sent to you electronically.  As this is a virtual visit, video technology does not allow for your provider to perform a traditional examination. This may limit your provider's ability to fully assess your condition. If your provider identifies any concerns that need to be evaluated in person or the need to arrange testing (such as labs, EKG, etc.), we will make arrangements to do so. Although advances in technology are sophisticated, we cannot ensure that it will always work on either your end or our end. If the connection with a video visit is poor, the visit may have to be switched to a telephone visit. With either a video or telephone visit, we are not always able to ensure that we have a secure connection.  By engaging in this virtual visit, you consent to the provision of healthcare and authorize for your insurance to be billed (if applicable) for the services provided during this visit. Depending on your insurance coverage, you may receive a charge related to this service.  I need to obtain your verbal consent now. Are you willing to proceed with your visit today? Rebecca Spence has provided verbal consent on 09/17/2022 for a virtual visit (video or telephone). Rebecca Spence, [place]  Date: 09/17/2022 6:27 PM  Virtual Visit via Video Note   I, Rebecca Spence, connected with  Grant Mazzola  ([id number], [birth year]) on 09/17/22 at  6:30 PM EDT by a video-enabled telemedicine application and verified that I am speaking with the correct person using two identifiers.  Location: Patient: Virtual  Visit Location Patient: Home Provider: Virtual Visit Location Provider: Home Office   I discussed the limitations of evaluation and management by telemedicine and the availability of in person appointments. The patient expressed understanding and agreed to proceed.    History of Present Illness: Rebecca Spence is a 42 y.o. who identifies as a female who was assigned female at birth, and is being seen today for COVID-19. Endorses symptoms starting 2 days ago with body aches, lower back pain followed by fever (Tmax 102), sore throat, increased body aches. Notes decreased appetite. Denies diarrhea, nausea or vomiting. Denies chest pain or SOB. Is taking her albuterol inhaler daily as needed since symptom onset. Took a home COVID test today which was positive.   OTC -- Dayquil/Nyquil  HPI: HPI  Problems:  Patient Active Problem List   Diagnosis Date Noted   Eczema 10/29/2016   DM type 2 (diabetes mellitus, type 2) (HCC) 10/01/2016   Hyperlipidemia 10/01/2016   Essential hypertension 01/21/2014    Allergies:  Allergies  Allergen Reactions   Orange Anaphylaxis and Hives    "My throat starts closing up, my tongue, lips started swelling & burning.  Similar reaction to onions.   Peach [Prunus Persica] Hives    Throat closes up, lips swell and burn   Fentanyl     RN witnessed patient having hives and lip swelling.    Medications:  Current Outpatient Medications:    amLODipine (NORVASC) 10 MG tablet, Take 1 tablet (10 mg total) by mouth daily., Disp:  90 tablet, Rfl: 1   FLUoxetine (PROZAC) 20 MG tablet, Take 1 tablet (20 mg total) by mouth daily., Disp: 90 tablet, Rfl: 1   lisinopril-hydrochlorothiazide (ZESTORETIC) 20-25 MG tablet, Take 1 tablet by mouth daily., Disp: 90 tablet, Rfl: 1  Observations/Objective: Patient is well-developed, well-nourished in no acute distress.  Resting comfortably at home.  Head is normocephalic, atraumatic.  No labored breathing. Speech is clear and  coherent with logical content.  Patient is alert and oriented at baseline.   Assessment and Plan: 1. COVID-19  Patient with multiple risk factors for complicated course of illness. Discussed risks/benefits of antiviral medications including most common potential ADRs. Patient voiced understanding and would like to proceed with antiviral medication. They are candidate for Paxlovid. Rx sent to pharmacy. Supportive measures, OTC medications and vitamin regimen reviewed. Tessalon per orders. Quarantine reviewed in detail. Strict ER precautions discussed with patient.    Follow Up Instructions: I discussed the assessment and treatment plan with the patient. The patient was provided an opportunity to ask questions and all were answered. The patient agreed with the plan and demonstrated an understanding of the instructions.  A copy of instructions were sent to the patient via MyChart unless otherwise noted below.   The patient was advised to call back or seek an in-person evaluation if the symptoms worsen or if the condition fails to improve as anticipated.  Time:  I spent 10 minutes with the patient via telehealth technology discussing the above problems/concerns.    Rebecca Climes, PA-C

## 2022-09-17 NOTE — Progress Notes (Signed)
Patient joined video ahead of appt time but left. Did not return to video. Provider waited until 10 past appointment time without the patient showing.

## 2022-09-17 NOTE — Patient Instructions (Signed)
Jeanne Ivan, thank you for joining [name], PA-C for today's virtual visit.  While this provider is not your primary care provider (PCP), if your PCP is located in our provider database this encounter information will be shared with them immediately following your visit.   A [facility] MyChart account gives you access to today's visit and all your visits, tests, and labs performed at [facility] " click here if you don't have a [facility] MyChart account or go to [email or web address]  Consent: (Patient) Rebecca Spence provided verbal consent for this virtual visit at the beginning of the encounter.  Current Medications:  Current Outpatient Medications:    amLODipine (NORVASC) 10 MG tablet, Take 1 tablet (10 mg total) by mouth daily., Disp: 90 tablet, Rfl: 1   FLUoxetine (PROZAC) 20 MG tablet, Take 1 tablet (20 mg total) by mouth daily., Disp: 90 tablet, Rfl: 1   lisinopril-hydrochlorothiazide (ZESTORETIC) 20-25 MG tablet, Take 1 tablet by mouth daily., Disp: 90 tablet, Rfl: 1   Medications ordered in this encounter:  No orders of the defined types were placed in this encounter.    *If you need refills on other medications prior to your next appointment, please contact your pharmacy*  Follow-Up: Call back or seek an in-person evaluation if the symptoms worsen or if the condition fails to improve as anticipated.   Virtual Care [phone]  Care Instructions: Please keep well-hydrated and get plenty of rest. Start a saline nasal rinse to flush out your nasal passages. You can use plain Mucinex to help thin congestion. If you have a humidifier, running in the bedroom at night. I want you to start OTC vitamin D3 1000 units daily, vitamin C 1000 mg daily, and a zinc supplement. Please take prescribed medications as directed.  Isolation Instructions: You are to isolate at home until you have been fever free for at least 24 hours  without a fever-reducing medication, and symptoms have been steadily improving for 24 hours. At that time,  you can end isolation but need to mask for an additional 5 days.   If you must be around other household members who do not have symptoms, you need to make sure that both you and the family members are masking consistently with a high-quality mask.  If you note any worsening of symptoms despite treatment, please seek an in-person evaluation ASAP. If you note any significant shortness of breath or any chest pain, please seek ER evaluation. Please do not delay care!   COVID-19: What to Do if You Are Sick If you test positive and are an older adult or someone who is at high risk of getting very sick from COVID-19, treatment may be available. Contact a healthcare provider right away after a positive test to determine if you are eligible, even if your symptoms are mild right now. You can also visit a Test to Treat location and, if eligible, receive a prescription from a provider. Don't delay: Treatment must be started within the first few days to be effective. If you have a fever, cough, or other symptoms, you might have COVID-19. Most people have mild illness and are able to recover at home. If you are sick: Keep track of your symptoms. If you have an emergency warning sign (including trouble breathing), call 911. Steps to help prevent the spread of COVID-19 if you are sick If you are sick with COVID-19 or think you might have COVID-19, follow the steps below to care for  yourself and to help protect other people in your home and community. Stay home except to get medical care Stay home. Most people with COVID-19 have mild illness and can recover at home without medical care. Do not leave your home, except to get medical care. Do not visit public areas and do not go to places where you are unable to wear a mask. Take care of yourself. Get rest and stay hydrated. Take over-the-counter medicines, such  as acetaminophen, to help you feel better. Stay in touch with your doctor. Call before you get medical care. Be sure to get care if you have trouble breathing, or have any other emergency warning signs, or if you think it is an emergency. Avoid public transportation, ride-sharing, or taxis if possible. Get tested If you have symptoms of COVID-19, get tested. While waiting for test results, stay away from others, including staying apart from those living in your household. Get tested as soon as possible after your symptoms start. Treatments may be available for people with COVID-19 who are at risk for becoming very sick. Don't delay: Treatment must be started early to be effective--some treatments must begin within 5 days of your first symptoms. Contact your healthcare provider right away if your test result is positive to determine if you are eligible. Self-tests are one of several options for testing for the virus that causes COVID-19 and may be more convenient than laboratory-based tests and point-of-care tests. Ask your healthcare provider or your local health department if you need help interpreting your test results. You can visit your state, tribal, local, and territorial health department's website to look for the latest local information on testing sites. Separate yourself from other people As much as possible, stay in a specific room and away from other people and pets in your home. If possible, you should use a separate bathroom. If you need to be around other people or animals in or outside of the home, wear a well-fitting mask. Tell your close contacts that they may have been exposed to COVID-19. An infected person can spread COVID-19 starting 48 hours (or 2 days) before the person has any symptoms or tests positive. By letting your close contacts know they may have been exposed to COVID-19, you are helping to protect everyone. See COVID-19 and Animals if you have questions about pets. If you  are diagnosed with COVID-19, someone from the health department may call you. Answer the call to slow the spread. Monitor your symptoms Symptoms of COVID-19 include fever, cough, or other symptoms. Follow care instructions from your healthcare provider and local health department. Your local health authorities may give instructions on checking your symptoms and reporting information. When to seek emergency medical attention Look for emergency warning signs* for COVID-19. If someone is showing any of these signs, seek emergency medical care immediately: Trouble breathing Persistent pain or pressure in the chest New confusion Inability to wake or stay awake Pale, gray, or blue-colored skin, lips, or nail beds, depending on skin tone *This list is not all possible symptoms. Please call your medical provider for any other symptoms that are severe or concerning to you. Call 911 or call ahead to your local emergency facility: Notify the operator that you are seeking care for someone who has or may have COVID-19. Call ahead before visiting your doctor Call ahead. Many medical visits for routine care are being postponed or done by phone or telemedicine. If you have a medical appointment that cannot be postponed, call your  doctor's office, and tell them you have or may have COVID-19. This will help the office protect themselves and other patients. If you are sick, wear a well-fitting mask You should wear a mask if you must be around other people or animals, including pets (even at home). Wear a mask with the best fit, protection, and comfort for you. You don't need to wear the mask if you are alone. If you can't put on a mask (because of trouble breathing, for example), cover your coughs and sneezes in some other way. Try to stay at least 6 feet away from other people. This will help protect the people around you. Masks should not be placed on young children under age 37 years, anyone who has trouble  breathing, or anyone who is not able to remove the mask without help. Cover your coughs and sneezes Cover your mouth and nose with a tissue when you cough or sneeze. Throw away used tissues in a lined trash can. Immediately wash your hands with soap and water for at least 20 seconds. If soap and water are not available, clean your hands with an alcohol-based hand sanitizer that contains at least 60% alcohol. Clean your hands often Wash your hands often with soap and water for at least 20 seconds. This is especially important after blowing your nose, coughing, or sneezing; going to the bathroom; and before eating or preparing food. Use hand sanitizer if soap and water are not available. Use an alcohol-based hand sanitizer with at least 60% alcohol, covering all surfaces of your hands and rubbing them together until they feel dry. Soap and water are the best option, especially if hands are visibly dirty. Avoid touching your eyes, nose, and mouth with unwashed hands. Handwashing Tips Avoid sharing personal household items Do not share dishes, drinking glasses, cups, eating utensils, towels, or bedding with other people in your home. Wash these items thoroughly after using them with soap and water or put in the dishwasher. Clean surfaces in your home regularly Clean and disinfect high-touch surfaces (for example, doorknobs, tables, handles, light switches, and countertops) in your "sick room" and bathroom. In shared spaces, you should clean and disinfect surfaces and items after each use by the person who is ill. If you are sick and cannot clean, a caregiver or other person should only clean and disinfect the area around you (such as your bedroom and bathroom) on an as needed basis. Your caregiver/other person should wait as long as possible (at least several hours) and wear a mask before entering, cleaning, and disinfecting shared spaces that you use. Clean and disinfect areas that may have blood,  stool, or body fluids on them. Use household cleaners and disinfectants. Clean visible dirty surfaces with household cleaners containing soap or detergent. Then, use a household disinfectant. Use a product from [employer] List N: Disinfectants for Coronavirus (COVID-19). Be sure to follow the instructions on the label to ensure safe and effective use of the product. Many products recommend keeping the surface wet with a disinfectant for a certain period of time (look at "contact time" on the product label). You may also need to wear personal protective equipment, such as gloves, depending on the directions on the product label. Immediately after disinfecting, wash your hands with soap and water for 20 seconds. For completed guidance on cleaning and disinfecting your home, visit Complete Disinfection Guidance. Take steps to improve ventilation at home Improve ventilation (air flow) at home to help prevent from spreading COVID-19 to other  people in your household. Clear out COVID-19 virus particles in the air by opening windows, using air filters, and turning on fans in your home. Use this interactive tool to learn how to improve air flow in your home. When you can be around others after being sick with COVID-19 Deciding when you can be around others is different for different situations. Find out when you can safely end home isolation. For any additional questions about your care, contact your healthcare provider or state or local health department. 04/11/2020 Content source: [facility] ([facility]), Division of Viral Diseases This information is not intended to replace advice given to you by your health care provider. Make sure you discuss any questions you have with your health care provider. Document Revised: 05/25/2020 Document Reviewed: 05/25/2020 Elsevier Patient Education  2022 [employer].  If you have been instructed to have an in-person evaluation  today at a local Urgent Care facility, please use the link below. It will take you to a list of all of our available [facility], including address, phone number and hours of operation. Please do not delay care.  [facility]  If you or a family member do not have a primary care provider, use the link below to schedule a visit and establish care. When you choose a [facility] primary care physician or advanced practice provider, you gain a long-term partner in health. Find a Primary Care Provider  Learn more about [facility]'s in-office and virtual care options: [facility] - Get Care Now

## 2022-10-04 ENCOUNTER — Other Ambulatory Visit: Payer: Self-pay

## 2022-10-04 NOTE — Progress Notes (Signed)
Patient attempted to be outreached by Sofie Rower, PharmD to discuss hypertension. Patient to return our call at their convenience at [phone].  Sofie Rower, PharmD [employer] PGY1

## 2022-10-07 ENCOUNTER — Encounter (INDEPENDENT_AMBULATORY_CARE_PROVIDER_SITE_OTHER): Payer: Self-pay | Admitting: Primary Care

## 2022-10-07 DIAGNOSIS — R7309 Other abnormal glucose: Secondary | ICD-10-CM | POA: Insufficient documentation

## 2022-10-14 ENCOUNTER — Telehealth (INDEPENDENT_AMBULATORY_CARE_PROVIDER_SITE_OTHER): Payer: Self-pay

## 2022-10-14 NOTE — Telephone Encounter (Signed)
Contacted pt to schedule a bp check pt didn't was unable to lvm due to phone being disconnected   If pt calls back please schedule nurse visit for bp check will need to be before the end of the month

## 2023-04-06 ENCOUNTER — Emergency Department (HOSPITAL_COMMUNITY)
Admission: EM | Admit: 2023-04-06 | Discharge: 2023-04-06 | Disposition: A | Discharge status: Home / Self Care | Payer: Self-pay | Attending: Emergency Medicine | Admitting: Emergency Medicine

## 2023-04-06 ENCOUNTER — Emergency Department (HOSPITAL_BASED_OUTPATIENT_CLINIC_OR_DEPARTMENT_OTHER): Admit: 2023-04-06 | Discharge: 2023-04-06 | Disposition: A | Discharge status: Home / Self Care | Payer: Self-pay

## 2023-04-06 DIAGNOSIS — E119 Type 2 diabetes mellitus without complications: Secondary | ICD-10-CM | POA: Insufficient documentation

## 2023-04-06 DIAGNOSIS — I10 Essential (primary) hypertension: Secondary | ICD-10-CM | POA: Insufficient documentation

## 2023-04-06 DIAGNOSIS — M10072 Idiopathic gout, left ankle and foot: Secondary | ICD-10-CM | POA: Insufficient documentation

## 2023-04-06 DIAGNOSIS — R609 Edema, unspecified: Secondary | ICD-10-CM

## 2023-04-06 DIAGNOSIS — Z79899 Other long term (current) drug therapy: Secondary | ICD-10-CM | POA: Insufficient documentation

## 2023-04-06 LAB — BASIC METABOLIC PANEL
Anion gap: 6 (ref 5–15)
BUN: 8 mg/dL (ref 6–20)
CO2: 26 mmol/L (ref 22–32)
Calcium: 9 mg/dL (ref 8.9–10.3)
Chloride: 105 mmol/L (ref 98–111)
Creatinine, Ser: 0.9 mg/dL (ref 0.44–1.00)
GFR, Estimated: >60 mL/min (ref >60)
Glucose, Bld: 100 mg/dL — ABNORMAL HIGH (ref 70–99)
Potassium: 4.6 mmol/L (ref 3.5–5.1)
Sodium: 137 mmol/L (ref 135–145)

## 2023-04-06 MED ORDER — PREDNISONE 10 MG PO TABS: ORAL_TABLET | ORAL | 0 refills | Status: AC

## 2023-04-06 MED ORDER — PREDNISONE 20 MG PO TABS
40.0000 mg | ORAL_TABLET | Freq: Once | ORAL | Status: AC
  Administered 2023-04-06: 40 mg via ORAL
  Filled 2023-04-06: qty 2

## 2023-04-06 MED ORDER — NAPROXEN 500 MG PO TABS
500.0000 mg | ORAL_TABLET | Freq: Once | ORAL | Status: AC
  Administered 2023-04-06: 500 mg via ORAL
  Filled 2023-04-06: qty 1

## 2023-04-06 NOTE — Progress Notes (Signed)
 Left lower extremity venous duplex has been completed. Preliminary results can be found in CV Proc through chart review.  Results were given to Arabella Merles PA.  04/06/23 5:41 PM Olen Cordial RVT

## 2023-04-06 NOTE — ED Provider Notes (Signed)
 [place] EMERGENCY DEPARTMENT AT [facility] Provider Note   CSN: [id number] Arrival date & time: 04/06/23  1559     History  Chief Complaint  Patient presents with   Ankle Pain    Rebecca Spence is a 43 y.o. female with history of hypertension, type 2 diabetes, presents with concern for left ankle pain and swelling that has been ongoing for past 2 weeks.  States this came on fairly suddenly.  Denies any injuries, numbness, or tingling in her foot.  She denies any long plane or car rides, recent hospitalizations or surgeries, hormonal medication use.  She denies any chest pain or shortness of breath.  Denies any concern for STIs. She reports this has happened before about 2 years ago.   Ankle Pain      Home Medications Prior to Admission medications   Medication Sig Start Date End Date Taking? Authorizing Provider  predniSONE (DELTASONE) 10 MG tablet Take 4 tablets (40 mg total) by mouth daily with breakfast for 1 day, THEN 3 tablets (30 mg total) daily with breakfast for 2 days, THEN 2 tablets (20 mg total) daily with breakfast for 2 days, THEN 1 tablet (10 mg total) daily with breakfast for 1 day. 04/07/23 04/13/23 Yes Arabella Merles, PA-C  amLODipine (NORVASC) 10 MG tablet Take 1 tablet (10 mg total) by mouth daily. 07/18/22   Grayce Sessions, NP  benzonatate (TESSALON) 100 MG capsule Take 1 capsule (100 mg total) by mouth 3 (three) times daily as needed for cough. 09/17/22   Waldon Merl, PA-C  FLUoxetine (PROZAC) 20 MG tablet Take 1 tablet (20 mg total) by mouth daily. 07/18/22   Grayce Sessions, NP  lisinopril-hydrochlorothiazide (ZESTORETIC) 20-25 MG tablet Take 1 tablet by mouth daily. 07/18/22   Grayce Sessions, NP      Allergies    Orange, Peach [prunus persica], and Fentanyl    Review of Systems   Review of Systems  Musculoskeletal:        Right ankle swelling and pain    Physical Exam Updated Vital Signs BP (!) 149/95 (BP  Location: Left Arm)   Pulse 91   Temp 98.1 F (36.7 C) (Oral)   Resp 18   SpO2 100%  Physical Exam Vitals and nursing note reviewed.  Constitutional:      Appearance: Normal appearance.  HENT:     Head: Atraumatic.  Cardiovascular:     Rate and Rhythm: Normal rate and regular rhythm.     Comments: Dorsalis pedis pulse 2+ bilaterally Pulmonary:     Effort: Pulmonary effort is normal.  Musculoskeletal:     Comments: Diffuse edema of the left ankle.  Extremely tender to palpation over the left ankle diffusely with light touch.  Difficulty with range of motion of the left ankle secondary to pain.  Mildly tender to palpation over the dorsum of the left foot.  No tenderness palpation or edema of the 1st through 5th phalanges.   Right ankle with mild edema, but patient able to perform range of motion however difficulty.  No tenderness palpation of the right ankle diffusely.  Skin:    Comments: No overlying abrasions, lacerations to the lower extremities bilaterally  Neurological:     General: No focal deficit present.     Mental Status: She is alert.     Comments: Intact sensation in the bilateral lower extremities.  Psychiatric:        Mood and Affect: Mood normal.  Behavior: Behavior normal.     ED Results / Procedures / Treatments   Labs (all labs ordered are listed, but only abnormal results are displayed) Labs Reviewed  BASIC METABOLIC PANEL - Abnormal; Notable for the following components:      Result Value   Glucose, Bld 100 (*)    All other components within normal limits    EKG None  Radiology VAS [place] LOWER EXTREMITY VENOUS (DVT) (7a-7p) Result Date: 04/06/2023  Lower Venous DVT Study Patient Name:  Rebecca Spence  Date of Exam:   04/06/2023 Medical Rec #: [id number]            Accession #:    [id number] Date of Birth: [birth year]            Patient Gender: F Patient Age:   60 years Exam Location:  [facility] Procedure:      VAS [place] LOWER EXTREMITY  VENOUS (DVT) Referring Phys: Arabella Merles --------------------------------------------------------------------------------  Indications: Edema.  Risk Factors: None identified. Comparison Study: No prior studies. Performing Technologist: Chanda Busing RVT  Examination Guidelines: A complete evaluation includes B-mode imaging, spectral Doppler, color Doppler, and power Doppler as needed of all accessible portions of each vessel. Bilateral testing is considered an integral part of a complete examination. Limited examinations for reoccurring indications may be performed as noted. The reflux portion of the exam is performed with the patient in reverse Trendelenburg.  +-----+---------------+---------+-----------+----------+--------------+ RIGHTCompressibilityPhasicitySpontaneityPropertiesThrombus Aging +-----+---------------+---------+-----------+----------+--------------+ CFV  Full           Yes      Yes                                 +-----+---------------+---------+-----------+----------+--------------+   +---------+---------------+---------+-----------+----------+--------------+ LEFT     CompressibilityPhasicitySpontaneityPropertiesThrombus Aging +---------+---------------+---------+-----------+----------+--------------+ CFV      Full           Yes      Yes                                 +---------+---------------+---------+-----------+----------+--------------+ SFJ      Full                                                        +---------+---------------+---------+-----------+----------+--------------+ FV Prox  Full                                                        +---------+---------------+---------+-----------+----------+--------------+ FV Mid   Full                                                        +---------+---------------+---------+-----------+----------+--------------+ FV DistalFull                                                         +---------+---------------+---------+-----------+----------+--------------+  PFV      Full                                                        +---------+---------------+---------+-----------+----------+--------------+ POP      Full           Yes      Yes                                 +---------+---------------+---------+-----------+----------+--------------+ PTV      Full                                                        +---------+---------------+---------+-----------+----------+--------------+ PERO     Full                                                        +---------+---------------+---------+-----------+----------+--------------+     Summary: RIGHT: - No evidence of common femoral vein obstruction.   LEFT: - There is no evidence of deep vein thrombosis in the lower extremity.  - No cystic structure found in the popliteal fossa.  *See table(s) above for measurements and observations.    Preliminary     Procedures Procedures    Medications Ordered in ED Medications  naproxen (NAPROSYN) tablet 500 mg (500 mg Oral Given 04/06/23 1712)  predniSONE (DELTASONE) tablet 40 mg (40 mg Oral Given 04/06/23 1711)    ED Course/ Medical Decision Making/ A&P                                 Medical Decision Making Amount and/or Complexity of Data Reviewed Labs: ordered.  Risk Prescription drug management.     Differential diagnosis includes but is not limited to gout, gonococcal arthritis, septic arthritis, vascular insufficiency, CHF  ED Course:  Patient overall well appearing. Stable vital signs aside from a slightly elevated blood pressure 149/95 upon arrival. Afebrile, no tachycardia.  She is extremely tender to palpation over the left ankle, and left ankle also has diffuse edema.  Difficulty with range of motion of the left ankle secondary to pain.  Neurovascularly intact in the bilateral lower extremities.  She denies any injuries to the foot, low  concern for fracture or dislocation at this time.  DVT study was obtained due to the swelling, this was negative for any DVT in the left lower extremity.  Denies any concern for STIs, lower concern for gonococcal arthritis at this time.  No overlying abrasions or wounds, afebrile, no tachycardia, low concern for septic arthritis at this time.  Given sudden onset with significant tender to palpation of the ankle, suspect gout. I Ordered, and personally interpreted labs.  The pertinent results include:   BMP with glucose of 100, creatinine within normal limits Given patient's diabetes well-controlled, glucose only under here, will prescribe course of prednisone for home to treat her gout.  Will also have her use naproxen to help with pain as her creatinine is within normal limits.  She was given her first dose of these medications here.  Stable and appropriate for discharge home  Impression: Gout of left ankle  Disposition:  The patient was discharged home with instructions to take prednisone taper as prescribed.  Naproxen as needed for pain.  Follow-up with PCP for recheck of symptoms within the next week. Return precautions given.  Imaging Studies ordered: I ordered imaging studies including left lower extremity DVT study I independently visualized the imaging with scope of interpretation limited to determining acute life threatening conditions related to emergency care. Imaging showed negative for DVT I agree with the radiologist interpretation              Final Clinical Impression(s) / ED Diagnoses Final diagnoses:  Acute idiopathic gout of left ankle    Rx / DC Orders ED Discharge Orders          Ordered    predniSONE (DELTASONE) 10 MG tablet  Q breakfast        04/06/23 1857              Arabella Merles, PA-C 04/06/23 1954    Loetta Rough, MD 04/06/23 2244

## 2023-04-06 NOTE — Discharge Instructions (Signed)
 You likely have a gout flare.  You have been started on Naproxen here in the ER for your gout. Please  continue to take 500mg  naproxen (Aleve) every 12 hours as needed for pain. Typically, pain should start to resolve within about 7-10 days. Discontinue taking this medication when your pain has resolved.  This is an over-the-counter medication you can obtain at any drugstore.  You have been prescribed prednisone which is another anti-inflammatory. Please take this medication as prescribed for the next 7 days (40mg  on days 1 and 2, 30mg  on days 3 and 4, 20mg  on days 5 and 6, 10mg  on day 7).  You were given the first dose here today.  Take this medication in the morning, as taking it at night may make it hard to sleep.   Maintaining a healthy weight and healthy blood pressure can help reduce the risk of flares. Limit consumption of alcohol, sweetened beverages (like sodas and sweet tea), red meats, organ meats (liver), and shellfish. Consumption of these foods can trigger flares.   If you have frequent flares, please schedule an appointment to talk to your PCP. They will be able to recommend further strategies for prevention of gout flare ups, which may include preventative medications.  Return to the ER if your symptoms do not start to improve within the next 48 hours, you develop fever or chills, any other new or concerning symptoms

## 2023-04-06 NOTE — ED Triage Notes (Signed)
 Pt c/o bilateral ankle pain and swelling for the last two weeks, L>R. Pt denies known injury.

## 2023-04-07 ENCOUNTER — Telehealth (INDEPENDENT_AMBULATORY_CARE_PROVIDER_SITE_OTHER): Payer: Self-pay

## 2023-04-07 NOTE — ED Notes (Signed)
 Opened chart 04/07/23 1537 to provide work not at pts request.

## 2023-04-07 NOTE — Telephone Encounter (Signed)
 Returned pt call. Pt states she is at work and she is not able to stand because of her foot. Made pt aware that I am not able to provide a work note. Made pt aware that [employer] sent a letter to MyChart and she should be able to download it and send to her manager. Pt states she understands and doesn't have any questions or concerns

## 2023-04-07 NOTE — Telephone Encounter (Signed)
 Copied from CRM [id number]. Topic: Clinical - Medical Advice >> Apr 07, 2023  3:27 PM Rebecca Spence wrote: Reason for CRM: patient called stated she went to the ER on yesterday, they took her out of work and she is at work today and her left foot is still swollen and hurting. Patient is requesting a call back from her provider

## 2023-04-21 ENCOUNTER — Emergency Department (HOSPITAL_COMMUNITY)
Admission: EM | Admit: 2023-04-21 | Discharge: 2023-04-21 | Disposition: A | Discharge status: Home / Self Care | Payer: Self-pay

## 2023-04-21 ENCOUNTER — Emergency Department (HOSPITAL_COMMUNITY): Payer: Self-pay

## 2023-04-21 ENCOUNTER — Other Ambulatory Visit: Payer: Self-pay

## 2023-04-21 ENCOUNTER — Encounter (HOSPITAL_COMMUNITY): Payer: Self-pay

## 2023-04-21 DIAGNOSIS — I10 Essential (primary) hypertension: Secondary | ICD-10-CM | POA: Insufficient documentation

## 2023-04-21 DIAGNOSIS — M79671 Pain in right foot: Secondary | ICD-10-CM | POA: Insufficient documentation

## 2023-04-21 DIAGNOSIS — M79604 Pain in right leg: Secondary | ICD-10-CM

## 2023-04-21 DIAGNOSIS — M79672 Pain in left foot: Secondary | ICD-10-CM | POA: Diagnosis not present

## 2023-04-21 DIAGNOSIS — Z79899 Other long term (current) drug therapy: Secondary | ICD-10-CM | POA: Diagnosis not present

## 2023-04-21 LAB — CBC WITH DIFFERENTIAL/PLATELET
Abs Immature Granulocytes: 0.03 10*3/uL (ref 0.00–0.07)
Basophils Absolute: 0.1 10*3/uL (ref 0.0–0.1)
Basophils Relative: 1 %
Eosinophils Absolute: 0.5 10*3/uL (ref 0.0–0.5)
Eosinophils Relative: 5 %
HCT: 37.2 % (ref 36.0–46.0)
Hemoglobin: 12 g/dL (ref 12.0–15.0)
Immature Granulocytes: 0 %
Lymphocytes Relative: 41 %
Lymphs Abs: 4.1 10*3/uL — ABNORMAL HIGH (ref 0.7–4.0)
MCH: 29.9 pg (ref 26.0–34.0)
MCHC: 32.3 g/dL (ref 30.0–36.0)
MCV: 92.8 fL (ref 80.0–100.0)
Monocytes Absolute: 0.8 10*3/uL (ref 0.1–1.0)
Monocytes Relative: 8 %
Neutro Abs: 4.4 10*3/uL (ref 1.7–7.7)
Neutrophils Relative %: 45 %
Platelets: 365 10*3/uL (ref 150–400)
RBC: 4.01 MIL/uL (ref 3.87–5.11)
RDW: 14.4 % (ref 11.5–15.5)
WBC: 9.8 10*3/uL (ref 4.0–10.5)
nRBC: 0 % (ref 0.0–0.2)

## 2023-04-21 LAB — BASIC METABOLIC PANEL WITH GFR
Anion gap: 9 (ref 5–15)
BUN: 9 mg/dL (ref 6–20)
CO2: 25 mmol/L (ref 22–32)
Calcium: 9.2 mg/dL (ref 8.9–10.3)
Chloride: 101 mmol/L (ref 98–111)
Creatinine, Ser: 0.9 mg/dL (ref 0.44–1.00)
GFR, Estimated: >60 mL/min (ref >60)
Glucose, Bld: 142 mg/dL — ABNORMAL HIGH (ref 70–99)
Potassium: 4.2 mmol/L (ref 3.5–5.1)
Sodium: 135 mmol/L (ref 135–145)

## 2023-04-21 LAB — HCG, SERUM, QUALITATIVE: Preg, Serum: NEGATIVE

## 2023-04-21 MED ORDER — KETOROLAC TROMETHAMINE 10 MG PO TABS: 10.0000 mg | ORAL_TABLET | Freq: Four times a day (QID) | ORAL | 0 refills | Status: DC | PRN

## 2023-04-21 MED ORDER — KETOROLAC TROMETHAMINE 15 MG/ML IJ SOLN
15.0000 mg | Freq: Once | INTRAMUSCULAR | Status: AC
  Administered 2023-04-21: 15 mg via INTRAVENOUS
  Filled 2023-04-21: qty 1

## 2023-04-21 MED ORDER — OXYCODONE-ACETAMINOPHEN 5-325 MG PO TABS
1.0000 | ORAL_TABLET | Freq: Once | ORAL | Status: AC
  Administered 2023-04-21: 1 via ORAL
  Filled 2023-04-21: qty 1

## 2023-04-21 MED ORDER — OXYCODONE-ACETAMINOPHEN 5-325 MG PO TABS: 1.0000 | ORAL_TABLET | Freq: Four times a day (QID) | ORAL | 0 refills | Status: DC | PRN

## 2023-04-21 NOTE — ED Provider Notes (Signed)
 [place] EMERGENCY DEPARTMENT AT [facility] Provider Note   CSN: [id number] Arrival date & time: 04/21/23  1648     History  Chief Complaint  Patient presents with   Foot Swelling    Rebecca Spence is a 43 y.o. female.  43 year old female with past medical history of hypertension presenting to the emergency department today with bilateral foot and ankle pain.  The patient states that this been going now for the past 4 weeks.  Patient was seen here 2 weeks ago and was prescribed prednisone.  She states this did not really help.  She denies any associated fevers.  She states that she has been walking on this but it is gradually gotten worse.  She reports that today she was having difficulty walking which is why she came to the ER for further evaluation.  The patient states that she was seeing a orthopedic surgeon in the past for similar issues but this was years ago.  She denies any recent injuries.        Home Medications Prior to Admission medications   Medication Sig Start Date End Date Taking? Authorizing Provider  ketorolac (TORADOL) 10 MG tablet Take 1 tablet (10 mg total) by mouth every 6 (six) hours as needed. 04/21/23  Yes Durwin Glaze, MD  oxyCODONE-acetaminophen (PERCOCET/ROXICET) 5-325 MG tablet Take 1 tablet by mouth every 6 (six) hours as needed for severe pain (pain score 7-10). 04/21/23  Yes Durwin Glaze, MD  amLODipine (NORVASC) 10 MG tablet Take 1 tablet (10 mg total) by mouth daily. 07/18/22   Grayce Sessions, NP  benzonatate (TESSALON) 100 MG capsule Take 1 capsule (100 mg total) by mouth 3 (three) times daily as needed for cough. 09/17/22   Waldon Merl, PA-C  FLUoxetine (PROZAC) 20 MG tablet Take 1 tablet (20 mg total) by mouth daily. 07/18/22   Grayce Sessions, NP  lisinopril-hydrochlorothiazide (ZESTORETIC) 20-25 MG tablet Take 1 tablet by mouth daily. 07/18/22   Grayce Sessions, NP      Allergies    Orange, Peach [prunus  persica], and Fentanyl    Review of Systems   Review of Systems  Musculoskeletal:        Bilateral foot/ankle pain  All other systems reviewed and are negative.   Physical Exam Updated Vital Signs BP (!) 154/81 (BP Location: Right Arm)   Pulse 79   Temp 98.4 F (36.9 C) (Oral)   Resp 17   Ht 5\' 7"  (1.702 m)   Wt 95.3 kg   LMP 04/05/2023   SpO2 96%   BMI 32.89 kg/m  Physical Exam Vitals and nursing note reviewed.   Gen: NAD Eyes: PERRL, EOMI HEENT: no oropharyngeal swelling Neck: trachea midline Resp: clear to auscultation bilaterally Card: RRR, no murmurs, rubs, or gallops Abd: nontender, nondistended Extremities: Trace pitting edema bilaterally, no calf tenderness MSK: The patient has pain with active range of motion of the right and left ankles, minimal pain with passive range of motion Vascular: 2+ radial pulses bilaterally, 2+ DP pulses bilaterally Skin: no rashes Psyc: acting appropriately   ED Results / Procedures / Treatments   Labs (all labs ordered are listed, but only abnormal results are displayed) Labs Reviewed  CBC WITH DIFFERENTIAL/PLATELET - Abnormal; Notable for the following components:      Result Value   Lymphs Abs 4.1 (*)    All other components within normal limits  BASIC METABOLIC PANEL WITH GFR - Abnormal; Notable for the  following components:   Glucose, Bld 142 (*)    All other components within normal limits  HCG, SERUM, QUALITATIVE    EKG None  Radiology DG Chest Portable 1 View Result Date: 04/21/2023 CLINICAL DATA:  Lower extremity swelling.  Shortness of breath EXAM: PORTABLE CHEST 1 VIEW COMPARISON:  07/14/2022 FINDINGS: Heart and mediastinal contours are within normal limits. No focal opacities or effusions. No acute bony abnormality. IMPRESSION: No active disease. Electronically Signed   By: Charlett Nose M.D.   On: 04/21/2023 21:32   DG Ankle Complete Left Result Date: 04/21/2023 CLINICAL DATA:  Bilateral foot and ankle  swelling EXAM: LEFT ANKLE COMPLETE - 3+ VIEW; RIGHT ANKLE - COMPLETE 3+ VIEW COMPARISON:  None Available. FINDINGS: Right ankle: Frontal, oblique, and lateral views are obtained. No acute fracture, subluxation, or dislocation. Joint spaces are well preserved. There is diffuse soft tissue swelling surrounding the right ankle. Left ankle: Frontal, oblique, and lateral views are obtained. No fracture, subluxation, or dislocation. Joint spaces are well preserved. There is diffuse soft tissue swelling surrounding the left ankle. IMPRESSION: 1. Diffuse soft tissue swelling of the bilateral ankles. 2. No acute bony abnormalities. Electronically Signed   By: Sharlet Salina M.D.   On: 04/21/2023 18:42   DG Ankle Complete Right Result Date: 04/21/2023 CLINICAL DATA:  Bilateral foot and ankle swelling EXAM: LEFT ANKLE COMPLETE - 3+ VIEW; RIGHT ANKLE - COMPLETE 3+ VIEW COMPARISON:  None Available. FINDINGS: Right ankle: Frontal, oblique, and lateral views are obtained. No acute fracture, subluxation, or dislocation. Joint spaces are well preserved. There is diffuse soft tissue swelling surrounding the right ankle. Left ankle: Frontal, oblique, and lateral views are obtained. No fracture, subluxation, or dislocation. Joint spaces are well preserved. There is diffuse soft tissue swelling surrounding the left ankle. IMPRESSION: 1. Diffuse soft tissue swelling of the bilateral ankles. 2. No acute bony abnormalities. Electronically Signed   By: Sharlet Salina M.D.   On: 04/21/2023 18:42    Procedures Procedures    Medications Ordered in ED Medications  oxyCODONE-acetaminophen (PERCOCET/ROXICET) 5-325 MG per tablet 1 tablet (has no administration in time range)  oxyCODONE-acetaminophen (PERCOCET/ROXICET) 5-325 MG per tablet 1 tablet (1 tablet Oral Given 04/21/23 2108)  ketorolac (TORADOL) 15 MG/ML injection 15 mg (15 mg Intravenous Given 04/21/23 2210)    ED Course/ Medical Decision Making/ A&P                                  Medical Decision Making 43 year old female with past medical history of hypertension presents the emergency department today with bilateral foot and ankle pain.  This been going now for greater than 4 weeks.  The patient is afebrile here.  Suspicion for septic arthritis is low given this.  Her x-rays ordered from triage do not show any acute bony abnormalities.  Will obtain basic labs here as well as a chest x-ray to evaluate for any pulmonary edema as the patient does have some trace edema here.  She already had ultrasounds ordered a few weeks ago that were negative for DVT so suspicion for this is low at this time.  Suspect this is likely due to gout or arthritis.  With the symptoms going now for greater than 4 weeks I will treat the patient's pain here.  I think that if we can get her symptoms under control that she may be able to follow-up with orthopedics as an outpatient.  The patient's labs here are reassuring.  Given Toradol for pain.  She will be discharged with orthopedic follow-up with return precautions.  Amount and/or Complexity of Data Reviewed Labs: ordered. Radiology: ordered.  Risk Prescription drug management.           Final Clinical Impression(s) / ED Diagnoses Final diagnoses:  Lower extremity pain, bilateral    Rx / DC Orders ED Discharge Orders          Ordered    ketorolac (TORADOL) 10 MG tablet  Every 6 hours PRN        04/21/23 2301    oxyCODONE-acetaminophen (PERCOCET/ROXICET) 5-325 MG tablet  Every 6 hours PRN        04/21/23 2301              Durwin Glaze, MD 04/21/23 2302

## 2023-04-21 NOTE — ED Triage Notes (Addendum)
 Pt presents with bilateral foot and ankle pain with swelling. Denies injury. She was seen at [facility] on 3/16 and reports they suspected gout. Pt took a course of prednisone and she had not improvement. Pain is worse today. Denies fever, chills, ShOB, or CP.   Also pt has been out of her BP medication for 2 days.

## 2023-04-21 NOTE — Discharge Instructions (Signed)
 Your workup today was reassuring.  Please take the ketorolac as needed for pain.  If you are still having pain is okay to take the Percocet.  Do not drive drink alcohol while taking this.  Please call tomorrow to schedule a follow-up appoint with your orthopedic doctor.  If you cannot find their information please call and schedule a follow-up appointment with the orthopedic surgeon on-call at the number provided.  Return to the emergency department for worsening symptoms.

## 2023-05-09 ENCOUNTER — Other Ambulatory Visit: Payer: Self-pay

## 2023-05-12 ENCOUNTER — Other Ambulatory Visit: Payer: Self-pay

## 2023-05-12 ENCOUNTER — Ambulatory Visit: Payer: Self-pay

## 2023-05-12 NOTE — Telephone Encounter (Addendum)
  Chief Complaint: ankle swelling/redness Symptoms: swelling, redness, pain Frequency: x 2 months, worsening Pertinent Negatives: Patient denies fever, URI sx, CP, SOB, calf pain Disposition: [] ED /[x] Urgent Care (no appt availability in office) / [x] Appointment(In office/virtual)/ []  [place] [facility]/ [] Home Care/ [] Refused Recommended Disposition /[] [place] [facility]/ []  Follow-up with PCP Additional Notes: Pt c/o chronic bilateral ankle pain that has worsened x few days. Pt reports going to ED x 2 with possible dx of gout. Pt was prescribed pain medication, but now pt noticing redness. Pt reports able to bare weight and walk, but painful. Denies SOB, CP, calf pain. Triager attempted to schedule, but no access. Triager called [facility] and spoke to [place] re: dispo and no access, patient transferred to discuss alternatives.     Copied from CRM [id number]. Topic: Clinical - Red Word Triage >> May 12, 2023  9:35 AM Crispin Dolphin wrote: Red Word that prompted transfer to Nurse Triage: ankle swollen - red blood clots around it Reason for Disposition  [1] Redness AND [2] painful when touched AND [3] no fever  Answer Assessment - Initial Assessment Questions 1. LOCATION: "Which ankle is swollen?" "Where is the swelling?"     bilateral 2. ONSET: "When did the swelling start?"     X 2 months Reports went to hospital and prescribed rx and improved -  3. SWELLING: "How bad is the swelling?" Or, "How large is it?" (e.g., mild, moderate, severe; size of localized swelling)    - NONE: No joint swelling.   - LOCALIZED: Localized; small area of puffy or swollen skin (e.g., insect bite, skin irritation).   - MILD: Joint looks or feels mildly swollen or puffy.   - MODERATE: Swollen; interferes with normal activities (e.g., work or school); decreased range of movement; may be limping.   - SEVERE: Very swollen; can't move swollen joint at all; limping a lot or unable to walk.     Mild - moderate, endorses  swelling from foot to ankle. Able to walk, but hurts when flexing 4. PAIN: "Is there any pain?" If Yes, ask: "How bad is it?" (Scale 1-10; or mild, moderate, severe)   - NONE (0): no pain.   - MILD (1-3): doesn't interfere with normal activities.    - MODERATE (4-7): interferes with normal activities (e.g., work or school) or awakens from sleep, limping.    - SEVERE (8-10): excruciating pain, unable to do any normal activities, unable to walk.      Mild-moderate 5. CAUSE: "What do you think caused the ankle swelling?"     unknown 6. OTHER SYMPTOMS: "Do you have any other symptoms?" (e.g., fever, chest pain, difficulty breathing, calf pain)     Pt endorses redness from ankle to calf Denies other sx above  Protocols used: Ankle Swelling-A-AH

## 2023-05-16 ENCOUNTER — Ambulatory Visit (INDEPENDENT_AMBULATORY_CARE_PROVIDER_SITE_OTHER): Payer: Self-pay | Admitting: Primary Care

## 2023-05-21 ENCOUNTER — Other Ambulatory Visit: Payer: Self-pay

## 2023-06-11 ENCOUNTER — Other Ambulatory Visit: Payer: Self-pay

## 2023-06-18 ENCOUNTER — Other Ambulatory Visit: Payer: Self-pay

## 2023-06-18 ENCOUNTER — Ambulatory Visit: Payer: Self-pay

## 2023-06-18 NOTE — Telephone Encounter (Signed)
  Chief Complaint: foot pain, swelling Symptoms: bilateral foot pain and swelling (mild) Frequency: x months Pertinent Negatives: Patient denies fever,redness, spreading red streaks Disposition: [] ED /[] Urgent Care (no appt availability in office) / [x] Appointment(In office/virtual)/ []  [place] [facility]/ [] Home Care/ [] Refused Recommended Disposition /[] [place] [facility]/ []  Follow-up with PCP Additional Notes: Patient states she was seen at [facility] ED for left foot pain and was told she had gout and was placed on medication. After taking the medication then the pain went to both feet. She went to [facility] ED and was told it is not gout. Patient states she was supposed to follow up with a referral but did not schedule with them. Patient states she has already scheduled herself for a visit with PCP (informed patient sooner appts available and she states she can not come in sooner than 07/07/23). Patient provided with information on [facility] follow up from ED visit (Dr Claiborne Crew). Patient states she is calling [facility] now to set up appointment.  Copied from CRM [id number] [id number]. Topic: Appointments - Appointment Scheduling >> Jun 18, 2023  3:19 PM Chrystal Crape R wrote: Foot and ankle pain  (left) Pt has been seen in ER 3/16 & 03/31  Foot is not as bad however it is still painful.   Pt is also trying to locate referral provided by the ER. I was not able to find it in epic, however I did provide pt with the medical records phone number. Reason for Disposition  [1] MILD pain (e.g., does not interfere with normal activities) AND [2] present > 7 days  Answer Assessment - Initial Assessment Questions 1. ONSET: "When did the pain start?"      End of February.  2. LOCATION: "Where is the pain located?"      Both feet. Worse in left foot than right.  3. PAIN: "How bad is the pain?"    (Scale 1-10; or mild, moderate, severe)  - MILD (1-3): doesn't interfere with normal activities.   -  MODERATE (4-7): interferes with normal activities (e.g., work or school) or awakens from sleep, limping.   - SEVERE (8-10): excruciating pain, unable to do any normal activities, unable to walk.      2/10.  4. WORK OR EXERCISE: "Has there been any recent work or exercise that involved this part of the body?"      No.  5. CAUSE: "What do you think is causing the foot pain?"     Unsure, [facility] referred her to a specialists but she states she was not able to make the appointment.  6. OTHER SYMPTOMS: "Do you have any other symptoms?" (e.g., leg pain, rash, fever, numbness)     Discoloration she states she had back in March has resolved, swelling in both feet (improved, puffiness to mild)  7. PREGNANCY: "Is there any chance you are pregnant?" "When was your last menstrual period?"     LMP: 05/28/23  Protocols used: Foot Pain-A-AH

## 2023-06-18 NOTE — Telephone Encounter (Signed)
 Will forward to provider

## 2023-06-30 ENCOUNTER — Telehealth (INDEPENDENT_AMBULATORY_CARE_PROVIDER_SITE_OTHER): Payer: Self-pay | Admitting: Primary Care

## 2023-06-30 NOTE — Telephone Encounter (Signed)
 Called [t to confirm appt. Pt did not answer and LVM.

## 2023-07-04 ENCOUNTER — Telehealth (INDEPENDENT_AMBULATORY_CARE_PROVIDER_SITE_OTHER): Payer: Self-pay | Admitting: Primary Care

## 2023-07-04 NOTE — Telephone Encounter (Signed)
 Error

## 2023-07-04 NOTE — Telephone Encounter (Signed)
 Called pt to confirm appt. Pt will be present.

## 2023-07-07 ENCOUNTER — Other Ambulatory Visit: Payer: Self-pay

## 2023-07-07 ENCOUNTER — Encounter (INDEPENDENT_AMBULATORY_CARE_PROVIDER_SITE_OTHER): Payer: Self-pay | Admitting: Primary Care

## 2023-07-07 ENCOUNTER — Ambulatory Visit (INDEPENDENT_AMBULATORY_CARE_PROVIDER_SITE_OTHER): Admitting: Primary Care

## 2023-07-07 VITALS — BP 131/90 | HR 80 | Resp 16 | Ht 66.0 in | Wt 222.4 lb

## 2023-07-07 DIAGNOSIS — I10 Essential (primary) hypertension: Secondary | ICD-10-CM

## 2023-07-07 DIAGNOSIS — E119 Type 2 diabetes mellitus without complications: Secondary | ICD-10-CM

## 2023-07-07 DIAGNOSIS — F418 Other specified anxiety disorders: Secondary | ICD-10-CM | POA: Diagnosis not present

## 2023-07-07 DIAGNOSIS — Z1159 Encounter for screening for other viral diseases: Secondary | ICD-10-CM

## 2023-07-07 DIAGNOSIS — E785 Hyperlipidemia, unspecified: Secondary | ICD-10-CM | POA: Diagnosis not present

## 2023-07-07 MED ORDER — LISINOPRIL-HYDROCHLOROTHIAZIDE 20-25 MG PO TABS
1.0000 | ORAL_TABLET | Freq: Every day | ORAL | 1 refills | Status: AC
  Filled 2023-07-07 – 2023-08-05 (×2): qty 90, 90d supply, fill #0
  Filled 2023-11-19 – 2024-02-11 (×2): qty 90, 90d supply, fill #1

## 2023-07-07 MED ORDER — CITALOPRAM HYDROBROMIDE 10 MG PO TABS
10.0000 mg | ORAL_TABLET | Freq: Every day | ORAL | 1 refills | Status: AC
  Filled 2023-07-07 – 2023-08-05 (×2): qty 90, 90d supply, fill #0
  Filled 2023-11-19 – 2024-02-11 (×2): qty 90, 90d supply, fill #1

## 2023-07-07 NOTE — Patient Instructions (Signed)
 Citalopram Solution What is this medication? CITALOPRAM (sye TAL oh pram) treats depression. It increases the amount of serotonin in the brain, a hormone that helps regulate mood. It belongs to a group of medications called SSRIs. This medicine may be used for other purposes; ask your health care provider or pharmacist if you have questions. COMMON BRAND NAME(S): Celexa What should I tell my care team before I take this medication? They need to know if you have any of these conditions: Bipolar disorder or a family history of bipolar disorder Bleeding disorder Glaucoma Heart disease History of irregular heartbeat Kidney disease Liver disease Low levels of magnesium  or potassium in the blood Receiving electroconvulsive therapy Seizures Suicidal thoughts, plans, or attempt by you or a family member Take medications that treat or prevent blood clots Thyroid  disease An unusual or allergic reaction to citalopram, other medications, foods, dyes, or preservatives Pregnant or trying to become pregnant Breastfeeding How should I use this medication? Take this medication by mouth. Take it as directed on the prescription label at the same time every day. Use a specially marked oral syringe, spoon, or dropper to measure each dose. Ask your pharmacist if you do not have one. Household spoons are not accurate. This medication can be taken with or without food. Do not take your medication more often than directed. Do not stop taking this medication suddenly except upon the advice of your care team. Stopping this medication too quickly may cause serious side effects or your condition may worsen. A special MedGuide will be given to you by the pharmacist with each prescription and refill. Be sure to read this information carefully each time. Talk to your care team about the use of this medication in children. Special care may be needed. People 60 years and older may have a stronger reaction and need a  smaller dose. Overdosage: If you think you have taken too much of this medicine contact a poison control center or emergency room at once. NOTE: This medicine is only for you. Do not share this medicine with others. What if I miss a dose? If you miss a dose, take it as soon as you can. If it is almost time for your next dose, take only that dose. Do not take double or extra doses. What may interact with this medication? Do not take this medication with any of the following: Certain medications for fungal infections, such as fluconazole , itraconazole, ketoconazole, posaconazole, voriconazole Cisapride Dronedarone Escitalopram Linezolid MAOIs, such as Carbex, Eldepryl, Marplan, Nardil, and Parnate Methylene blue (injected into a vein) Pimozide Thioridazine This medication may also interact with the following: Alcohol Amphetamines Aspirin and aspirin-like medications Carbamazepine Certain medications for infections, such as chloroquine, clarithromycin, erythromycin, furazolidone, isoniazid, pentamidine Certain medications for mental health conditions Certain medications for migraine headaches, such as almotriptan, eletriptan, frovatriptan, naratriptan, rizatriptan, sumatriptan, zolmitriptan Certain medications for sleep Certain medications that treat or prevent blood clots, such as dalteparin, enoxaparin, warfarin Cimetidine Diuretics Dofetilide Fentanyl  Lithium Methadone Metoprolol  NSAIDs, medications for pain and inflammation, such as ibuprofen  or naproxen  Omeprazole Other medications that cause heart rhythm changes Procarbazine Rasagiline Supplements, such as St. John's wort, kava kava, valerian Tramadol Tryptophan Ziprasidone This list may not describe all possible interactions. Give your health care provider a list of all the medicines, herbs, non-prescription drugs, or dietary supplements you use. Also tell them if you smoke, drink alcohol, or use illegal drugs. Some  items may interact with your medicine. What should I watch for while using this  medication? Tell your care team if your symptoms do not get better or if they get worse. Visit your care team for regular checks on your progress. Because it may take several weeks to see the full effects of this medication, it is important to continue your treatment as prescribed. This medication may cause thoughts of suicide or depression. This includes sudden changes in mood, behaviors, or thoughts. These changes can happen at any time but are more common in the beginning of treatment or after a change in dose. Call your care team right away if you experience these thoughts or worsening depression. This medication may cause mood and behavior changes, such as anxiety, nervousness, irritability, hostility, restlessness, excitability, hyperactivity, or trouble sleeping. These changes can happen at any time but are more common in the beginning of treatment or after a change in dose. Call your care team right away if you notice any of these symptoms. This medication may affect your coordination, reaction time, or judgment. Do not drive or operate machinery until you know how this medication affects you. Sit up or stand slowly to reduce the risk of dizzy or fainting spells. Drinking alcohol with this medication can increase the risk of these side effects. Your mouth may get dry. Chewing sugarless gum or sucking hard candy and drinking plenty of water may help. Contact your care team if the problem does not go away or is severe. What side effects may I notice from receiving this medication? Side effects that you should report to your care team as soon as possible: Allergic reactions--skin rash, itching, hives, swelling of the face, lips, tongue, or throat Bleeding--bloody or black, tar-like stools, red or dark brown urine, vomiting blood or brown material that looks like coffee grounds, small, red or purple spots on skin, unusual  bleeding or bruising Heart rhythm changes--fast or irregular heartbeat, dizziness, feeling faint or lightheaded, chest pain, trouble breathing Low sodium level--muscle weakness, fatigue, dizziness, headache, confusion Serotonin syndrome--irritability, confusion, fast or irregular heartbeat, muscle stiffness, twitching muscles, sweating, high fever, seizure, chills, vomiting, diarrhea Sudden eye pain or change in vision such as blurry vision, seeing halos around lights, vision loss Thoughts of suicide or self-harm, worsening mood, feelings of depression Side effects that usually do not require medical attention (report to your care team if they continue or are bothersome): Change in sex drive or performance Diarrhea Dry mouth Excessive sweating Nausea Tremors or shaking Upset stomach This list may not describe all possible side effects. Call your doctor for medical advice about side effects. You may report side effects to FDA at 1-800-FDA-1088. Where should I keep my medication? Keep out of reach of children and pets. Store at room temperature between 15 and 30 degrees C (59 and 86 degrees F). Throw away any unused medication after the expiration date. NOTE: This sheet is a summary. It may not cover all possible information. If you have questions about this medicine, talk to your doctor, pharmacist, or health care provider.  2024 Elsevier/Gold Standard (2021-10-02 00:00:00)

## 2023-07-07 NOTE — Progress Notes (Unsigned)
 [facility]  Rebecca Spence, is a 43 y.o. female  [id number]  [id number]  DOB - [birth year]  Chief Complaint  Patient presents with   Hypertension   Hyperlipidemia   Prediabetes   Insomnia        Medication Management    Pt states she stopped taking the prozac  because it's not working. Pt states she waskes up every hour on the hour because of her anxiety    Anxiety       Subjective:   Ms.  Rebecca Spence is a 43 y.o. obese female here today for a follow up visit.  Question patient about why she has not been to her visits she started crying stating she has been going through a lot of personal problems.  However, a lot things have changed in her favor and she is still working hard at continuing to provide for herself and her children.   Hypertension patient has No headache, No chest pain, No abdominal pain - No Nausea, No new weakness tingling or numbness, No Cough - shortness of breath. She also has been difficultly with anxiety and insomnia.  When she conveyed what situation she was previously in weight it down can be a contributory factor to these problems. She has racing thoughts  No problems updated.  Comprehensive ROS Pertinent positive and negative noted in HPI   Allergies  Allergen Reactions   Orange Anaphylaxis and Hives    My throat starts closing up, my tongue, lips started swelling & burning.  Similar reaction to onions.   Peach [Prunus Persica] Hives    Throat closes up, lips swell and burn   Fentanyl      RN witnessed patient having hives and lip swelling.     Past Medical History:  Diagnosis Date   Anxiety    Bilateral ovarian cysts    Bipolar affective disorder, current episode mixed, current episode severity unspecified (HCC)    DM type 2 (diabetes mellitus, type 2) (HCC) 10/01/2016   Eczema 10/29/2016   Nose and hands/forearms   Essential hypertension 2016   Gestational diabetes    09/2016:  prediabetes   Headache(784.0)     Hyperlipidemia 10/01/2016   Prediabetes 10/01/2016   Eye exam with Anthoney Kipper, M.D. negative for diabetic changes.   Schizophrenic disorder (HCC)     Current Outpatient Medications on File Prior to Visit  Medication Sig Dispense Refill   amLODipine  (NORVASC ) 10 MG tablet Take 1 tablet (10 mg total) by mouth daily. 90 tablet 1   benzonatate  (TESSALON ) 100 MG capsule Take 1 capsule (100 mg total) by mouth 3 (three) times daily as needed for cough. (Patient not taking: Reported on 07/07/2023) 30 capsule 0   FLUoxetine  (PROZAC ) 20 MG tablet Take 1 tablet (20 mg total) by mouth daily. (Patient not taking: Reported on 07/07/2023) 90 tablet 1   ketorolac  (TORADOL ) 10 MG tablet Take 1 tablet (10 mg total) by mouth every 6 (six) hours as needed. (Patient not taking: Reported on 07/07/2023) 20 tablet 0   No current facility-administered medications on file prior to visit.   Health Maintenance  Topic Date Due   Eye exam for diabetics  Never done   HPV Vaccine (1 - 3-dose series) Never done   Hepatitis C Screening  Never done   Complete foot exam   11/25/2019   Pap with HPV screening  02/27/2020   DTaP/Tdap/Td vaccine (2 - Td or Tdap) 10/22/2021   Yearly kidney health urinalysis for diabetes  02/28/2022  COVID-19 Vaccine (3 - 2024-25 season) 09/22/2022   Hemoglobin A1C  01/17/2023   Flu Shot  08/22/2023   Yearly kidney function blood test for diabetes  04/20/2024   Pneumococcal Vaccination  Completed   HIV Screening  Completed   Meningitis B Vaccine  Aged Out    Objective:   Vitals:   07/07/23 1430  BP: (!) 131/90  Pulse: 80  Resp: 16  SpO2: 100%  Weight: 222 lb 6.4 oz (100.9 kg)  Height: 5' 6 (1.676 m)   BP Readings from Last 3 Encounters:  07/07/23 (!) 131/90  04/21/23 (!) 154/81  04/06/23 (!) 149/95      Physical Exam Vitals reviewed.  Constitutional:      Appearance: Normal appearance. She is obese.  HENT:     Head: Normocephalic.     Right Ear: Tympanic membrane, ear  canal and external ear normal.     Left Ear: Tympanic membrane, ear canal and external ear normal.     Nose: Nose normal.     Mouth/Throat:     Mouth: Mucous membranes are moist.   Eyes:     Extraocular Movements: Extraocular movements intact.     Pupils: Pupils are equal, round, and reactive to light.    Cardiovascular:     Rate and Rhythm: Normal rate.  Pulmonary:     Effort: Pulmonary effort is normal.     Breath sounds: Normal breath sounds.  Abdominal:     General: Bowel sounds are normal.     Palpations: Abdomen is soft.   Musculoskeletal:        General: Normal range of motion.     Cervical back: Normal range of motion and neck supple.   Skin:    General: Skin is warm and dry.   Neurological:     Mental Status: She is alert and oriented to person, place, and time.   Psychiatric:        Mood and Affect: Mood normal.        Behavior: Behavior normal.        Thought Content: Thought content normal.    Assessment & Plan  Cherene was seen today for hypertension, hyperlipidemia, prediabetes, insomnia, medication management and anxiety.  Diagnoses and all orders for this visit:  Essential hypertension BP goal - < 130/80 Explained that having normal blood pressure is the goal and medications are helping to get to goal and maintain normal blood pressure. DIET: Limit salt intake, read nutrition labels to check salt content, limit fried and high fatty foods  Avoid using multisymptom OTC cold preparations that generally contain sudafed which can rise BP. Consult with pharmacist on best cold relief products to use for persons with HTN EXERCISE Discussed incorporating exercise such as walking - 30 minutes most days of the week and can do in 10 minute intervals    -     lisinopril -hydrochlorothiazide  (ZESTORETIC ) 20-25 MG tablet; Take 1 tablet by mouth daily.  Hyperlipidemia, unspecified hyperlipidemia type -     Lipid panel  Type 2 diabetes mellitus without complication,  without long-term current use of insulin  (HCC) - educated on lifestyle modifications, including but not limited to diet choices and adding exercise to daily routine.   -     Hemoglobin A1c -     Microalbumin / creatinine urine ratio  Need for hepatitis C screening test -     HCV Ab w Reflex to Quant PCR  Depression with anxiety Situational events ( with racing thoughts this  can happen again) works 7 days a week. -     citalopram (CELEXA) 10 MG tablet; Take 1 tablet (10 mg total) by mouth daily.  Patient have been counseled extensively about nutrition and exercise. Other issues discussed during this visit include: low cholesterol diet, weight control and daily exercise, foot care, annual eye examinations at Ophthalmology, importance of adherence with medications and regular follow-up. We also discussed long term complications of uncontrolled diabetes and hypertension.   Return in about 3 months (around 10/07/2023).  The patient was given clear instructions to go to ER or return to medical center if symptoms don't improve, worsen or new problems develop. The patient verbalized understanding. The patient was told to call to get lab results if they haven't heard anything in the next week.   This note has been created with Education officer, environmental. Any transcriptional errors are unintentional.   Marius Siemens, NP 07/07/2023, 2:51 PM

## 2023-07-08 LAB — MICROALBUMIN / CREATININE URINE RATIO
Creatinine, Urine: 316.3 mg/dL
Microalb/Creat Ratio: 4 mg/g{creat} (ref 0–29)
Microalbumin, Urine: 13.4 ug/mL

## 2023-07-08 LAB — HCV AB W REFLEX TO QUANT PCR: HCV Ab: NONREACTIVE

## 2023-07-08 LAB — HCV INTERPRETATION

## 2023-07-08 LAB — LIPID PANEL
Chol/HDL Ratio: 5.5 ratio — ABNORMAL HIGH (ref 0.0–4.4)
Cholesterol, Total: 230 mg/dL — ABNORMAL HIGH (ref 100–199)
HDL: 42 mg/dL (ref >39)
LDL Chol Calc (NIH): 149 mg/dL — ABNORMAL HIGH (ref 0–99)
Triglycerides: 214 mg/dL — ABNORMAL HIGH (ref 0–149)
VLDL Cholesterol Cal: 39 mg/dL (ref 5–40)

## 2023-07-08 LAB — HEMOGLOBIN A1C
Est. average glucose Bld gHb Est-mCnc: 177 mg/dL
Hgb A1c MFr Bld: 7.8 % — ABNORMAL HIGH (ref 4.8–5.6)

## 2023-07-14 ENCOUNTER — Ambulatory Visit (INDEPENDENT_AMBULATORY_CARE_PROVIDER_SITE_OTHER): Payer: Self-pay | Admitting: Primary Care

## 2023-07-14 ENCOUNTER — Other Ambulatory Visit: Payer: Self-pay

## 2023-07-14 DIAGNOSIS — E782 Mixed hyperlipidemia: Secondary | ICD-10-CM

## 2023-07-14 MED ORDER — ROSUVASTATIN CALCIUM 40 MG PO TABS
40.0000 mg | ORAL_TABLET | Freq: Every day | ORAL | 1 refills | Status: AC
  Filled 2023-07-14 – 2023-08-05 (×2): qty 90, 90d supply, fill #0
  Filled 2023-11-19 – 2024-02-11 (×2): qty 90, 90d supply, fill #1

## 2023-07-16 ENCOUNTER — Other Ambulatory Visit: Payer: Self-pay

## 2023-07-24 ENCOUNTER — Other Ambulatory Visit: Payer: Self-pay

## 2023-08-05 ENCOUNTER — Other Ambulatory Visit (INDEPENDENT_AMBULATORY_CARE_PROVIDER_SITE_OTHER): Payer: Self-pay | Admitting: Primary Care

## 2023-08-05 DIAGNOSIS — I10 Essential (primary) hypertension: Secondary | ICD-10-CM

## 2023-08-06 ENCOUNTER — Other Ambulatory Visit: Payer: Self-pay

## 2023-08-07 ENCOUNTER — Other Ambulatory Visit: Payer: Self-pay

## 2023-08-07 MED ORDER — AMLODIPINE BESYLATE 10 MG PO TABS
10.0000 mg | ORAL_TABLET | Freq: Every day | ORAL | 1 refills | Status: AC
  Filled 2023-08-07 (×2): qty 90, 90d supply, fill #0
  Filled 2023-11-19 – 2024-02-11 (×2): qty 90, 90d supply, fill #1

## 2023-08-07 NOTE — Telephone Encounter (Signed)
 Requested Prescriptions  Pending Prescriptions Disp Refills   amLODipine  (NORVASC ) 10 MG tablet 90 tablet 1    Sig: Take 1 tablet (10 mg total) by mouth daily.     Cardiovascular: Calcium  Channel Blockers 2 Failed - 08/07/2023 11:33 AM      Failed - Last BP in normal range    BP Readings from Last 1 Encounters:  07/07/23 (!) 131/90         Passed - Last Heart Rate in normal range    Pulse Readings from Last 1 Encounters:  07/07/23 80         Passed - Valid encounter within last 6 months    Recent Outpatient Visits           1 month ago Essential hypertension   [place] [facility] Celestia Rosaline SQUIBB, NP   1 year ago Hyperlipidemia, unspecified hyperlipidemia type   [place] [facility] Celestia Rosaline SQUIBB, NP   1 year ago Urinary tract infection without hematuria, site unspecified   [place] [facility] Celestia Rosaline SQUIBB, NP   2 years ago Prediabetes   [place] [facility] Celestia Rosaline SQUIBB, NP   2 years ago Prediabetes   [place] [facility] Celestia Rosaline SQUIBB, NP

## 2023-08-14 ENCOUNTER — Other Ambulatory Visit: Payer: Self-pay

## 2023-08-18 ENCOUNTER — Other Ambulatory Visit (HOSPITAL_COMMUNITY)
Admission: RE | Admit: 2023-08-18 | Discharge: 2023-08-18 | Disposition: A | Discharge status: Home / Self Care | Source: Ambulatory Visit | Attending: Primary Care | Admitting: Primary Care

## 2023-08-18 ENCOUNTER — Ambulatory Visit (INDEPENDENT_AMBULATORY_CARE_PROVIDER_SITE_OTHER): Admitting: Primary Care

## 2023-08-18 ENCOUNTER — Encounter (INDEPENDENT_AMBULATORY_CARE_PROVIDER_SITE_OTHER): Payer: Self-pay | Admitting: Primary Care

## 2023-08-18 VITALS — BP 122/86 | HR 91 | Resp 16 | Wt 221.2 lb

## 2023-08-18 DIAGNOSIS — Z803 Family history of malignant neoplasm of breast: Secondary | ICD-10-CM

## 2023-08-18 DIAGNOSIS — Z113 Encounter for screening for infections with a predominantly sexual mode of transmission: Secondary | ICD-10-CM | POA: Insufficient documentation

## 2023-08-18 DIAGNOSIS — Z124 Encounter for screening for malignant neoplasm of cervix: Secondary | ICD-10-CM

## 2023-08-18 NOTE — Progress Notes (Signed)
 [facility]  WELL-WOMAN PHYSICAL & PAP Patient name: Rebecca Spence MRN [id number]  Date of birth: 05-04-1980 Chief Complaint:   Gynecologic Exam  History of Present Illness:   Rebecca Spence is a 43 y.o. [phone] female being seen today for a routine well-woman exam.   CC: gyn  The current method of family planning is none.  No LMP recorded. Last pap 02/26/17. Results were:  Last mammogram: 2018.  Family h/o breast cancer: Yes Last colonoscopy: n/a Family h/o colorectal cancer: No  Health Maintenance  Topic Date Due   Eye exam for diabetics  Never done   Hepatitis B Vaccine (1 of 3 - 19+ 3-dose series) Never done   HPV Vaccine (1 - 3-dose SCDM series) Never done   Complete foot exam   11/25/2019   Pap with HPV screening  02/27/2020   DTaP/Tdap/Td vaccine (2 - Td or Tdap) 10/22/2021   COVID-19 Vaccine (3 - 2024-25 season) 09/22/2022   Flu Shot  08/22/2023   Hemoglobin A1C  01/06/2024   Yearly kidney function blood test for diabetes  04/20/2024   Yearly kidney health urinalysis for diabetes  07/06/2024   Pneumococcal Vaccination  Completed   Hepatitis C Screening  Completed   HIV Screening  Completed   Meningitis B Vaccine  Aged Out   Review of Systems:    Denies any headaches, blurred vision, fatigue, shortness of breath, chest pain, abdominal pain, abnormal vaginal discharge/itching/odor/irritation, problems with periods, bowel movements, urination, or intercourse unless otherwise stated above.  Pertinent History Reviewed:   Reviewed past medical,surgical, social and family history.  Reviewed problem list, medications and allergies.  History Rebecca Spence has a past medical history of Anxiety, Bilateral ovarian cysts, Bipolar affective disorder, current episode mixed, current episode severity unspecified (HCC), DM type 2 (diabetes mellitus, type 2) (HCC) (10/01/2016), Eczema (10/29/2016), Essential hypertension (2016), Gestational diabetes,  Headache(784.0), Hyperlipidemia (10/01/2016), Prediabetes (10/01/2016), and Schizophrenic disorder (HCC).   She has a past surgical history that includes No past surgeries.   Her family history includes Allergies in her son and son; Asthma in her son and son; Cancer in her sister; Diabetes in her maternal grandmother and mother; Eczema in her son and son; Hyperlipidemia in her maternal grandmother; Hypertension in her mother.She reports that she has been smoking cigarettes. She has a 3.3 pack-year smoking history. She has never used smokeless tobacco. She reports current alcohol use. She reports current drug use. Drug: Marijuana.  Current Outpatient Medications on File Prior to Visit  Medication Sig Dispense Refill   amLODipine  (NORVASC ) 10 MG tablet Take 1 tablet (10 mg total) by mouth daily. 90 tablet 1   citalopram  (CELEXA ) 10 MG tablet Take 1 tablet (10 mg total) by mouth daily. 90 tablet 1   lisinopril -hydrochlorothiazide  (ZESTORETIC ) 20-25 MG tablet Take 1 tablet by mouth daily. 90 tablet 1   rosuvastatin  (CRESTOR ) 40 MG tablet Take 1 tablet (40 mg total) by mouth daily. 90 tablet 1   No current facility-administered medications on file prior to visit.    Physical Assessment:   Vitals:   08/18/23 1448 08/18/23 1449  BP: (!) 126/90 122/86  Pulse: 91   Resp: 16   SpO2: 100%   Weight: 221 lb 3.2 oz (100.3 kg)   Body mass index is 35.7 kg/m.        Physical Examination:  General appearance - well appearing, and in no distress Mental status - alert, oriented to person, place, and time Psych:  She has a  normal mood and affect Skin - warm and dry, normal color, no suspicious lesions noted Chest - effort normal, all lung fields clear to auscultation bilaterally Heart - normal rate and regular rhythm Neck:  midline trachea, no thyromegaly or nodules Breasts - breasts appear normal, left side mass 11 o clock  right side mass 2 o clock masses palpated , no skin or nipple changes or  axillary nodes Educated patient on proper self breast examination and had patient to demonstrate SBE. Abdomen - soft, nontender, nondistended, no masses or organomegaly Pelvic-VULVA: normal appearing vulva with no masses, tenderness or lesions   VAGINA: normal appearing vagina with normal color and discharge, no lesions   CERVIX: normal appearing cervix without discharge or lesions, no CMT UTERUS: uterus is felt to be normal size, shape, consistency and nontender  ADNEXA: No adnexal masses or tenderness noted. Extremities:  No swelling or varicosities noted  No results found for this or any previous visit (from the past 24 hours).   Assessment & Plan:  Rebecca Spence was seen today for gynecologic exam.  Diagnoses and all orders for this visit: Rebecca Spence was seen today for gynecologic exam.  Diagnoses and all orders for this visit:  Cervical cancer screening -     Cytology - PAP  Screening for STD (sexually transmitted disease) -     Cervicovaginal ancillary only  Family history of breast cancer in first degree relative MM 3D DIAGNOSTIC MAMMOGRAM BILATERAL BREAST    Follow-up: Return in about 3 months (around 11/18/2023) for re-check blood pressure.  This note has been created with Education officer, environmental. Any transcriptional errors are unintentional.   Rosaline SHAUNNA Bohr, NP 08/18/2023, 3:40 PM

## 2023-08-19 ENCOUNTER — Telehealth: Admitting: Physician Assistant

## 2023-08-19 DIAGNOSIS — R197 Diarrhea, unspecified: Secondary | ICD-10-CM

## 2023-08-19 MED ORDER — LOPERAMIDE HCL 2 MG PO TABS: 2.0000 mg | ORAL_TABLET | Freq: Four times a day (QID) | ORAL | 0 refills | Status: DC | PRN

## 2023-08-19 NOTE — Progress Notes (Signed)
 Virtual Visit Consent   Rebecca Spence, you are scheduled for a virtual visit with a [facility] provider today. Just as with appointments in the office, your consent must be obtained to participate. Your consent will be active for this visit and any virtual visit you may have with one of our providers in the next 365 days. If you have a MyChart account, a copy of this consent can be sent to you electronically.  As this is a virtual visit, video technology does not allow for your provider to perform a traditional examination. This may limit your provider's ability to fully assess your condition. If your provider identifies any concerns that need to be evaluated in person or the need to arrange testing (such as labs, EKG, etc.), we will make arrangements to do so. Although advances in technology are sophisticated, we cannot ensure that it will always work on either your end or our end. If the connection with a video visit is poor, the visit may have to be switched to a telephone visit. With either a video or telephone visit, we are not always able to ensure that we have a secure connection.  By engaging in this virtual visit, you consent to the provision of healthcare and authorize for your insurance to be billed (if applicable) for the services provided during this visit. Depending on your insurance coverage, you may receive a charge related to this service.  I need to obtain your verbal consent now. Are you willing to proceed with your visit today? Rebecca Spence has provided verbal consent on 08/19/2023 for a virtual visit (video or telephone). Rebecca Spence, [place]  Spence: 08/19/2023 7:07 PM   Virtual Visit via Video Note   I, Rebecca Spence, connected with  Rebecca Spence  ([id number], Oct 31, 1980) on 08/19/23 at  6:30 PM EDT by a video-enabled telemedicine application and verified that I am speaking with the correct person using two identifiers.  Location: Patient: Virtual Visit Location  Patient: Home Provider: Virtual Visit Location Provider: Home Office   I discussed the limitations of evaluation and management by telemedicine and the availability of in person appointments. The patient expressed understanding and agreed to proceed.    History of Present Illness: Rebecca Spence is a 43 y.o. who identifies as a female who was assigned female at birth, and is being seen today for diarrhea.  HPI: 42y/o F presents for a telehealth video visit for c/o diarrhea x 1-2 days. +family members with similar symptoms. Denies nausea, vomiting, or urinary symptoms. +URI symptoms with fever prior to onset of diarrhea. Denies recent international travel or unusual diet recently.    Diarrhea     Problems:  Patient Active Problem List   Diagnosis Spence Noted   Elevated glucose level 10/07/2022   Eczema 10/29/2016   DM type 2 (diabetes mellitus, type 2) (HCC) 10/01/2016   Hyperlipidemia 10/01/2016   Essential hypertension 01/21/2014    Allergies:  Allergies  Allergen Reactions   Orange Anaphylaxis and Hives    My throat starts closing up, my tongue, lips started swelling & burning.  Similar reaction to onions.   Peach [Prunus Persica] Hives    Throat closes up, lips swell and burn   Fentanyl      RN witnessed patient having hives and lip swelling.    Medications:  Current Outpatient Medications:    loperamide  (IMODIUM  A-D) 2 MG tablet, Take 1 tablet (2 mg total) by mouth 4 (four) times daily as needed for diarrhea  or loose stools., Disp: 30 tablet, Rfl: 0   amLODipine  (NORVASC ) 10 MG tablet, Take 1 tablet (10 mg total) by mouth daily., Disp: 90 tablet, Rfl: 1   citalopram  (CELEXA ) 10 MG tablet, Take 1 tablet (10 mg total) by mouth daily., Disp: 90 tablet, Rfl: 1   lisinopril -hydrochlorothiazide  (ZESTORETIC ) 20-25 MG tablet, Take 1 tablet by mouth daily., Disp: 90 tablet, Rfl: 1   rosuvastatin  (CRESTOR ) 40 MG tablet, Take 1 tablet (40 mg total) by mouth daily., Disp: 90  tablet, Rfl: 1  Observations/Objective: Patient is well-developed, well-nourished in no acute distress.  Resting comfortably  at home.  Head is normocephalic, atraumatic.  No labored breathing.  Speech is clear and coherent with logical content.  Patient is alert and oriented at baseline.    Assessment and Plan: 1. Diarrhea, unspecified type (Primary) - loperamide  (IMODIUM  A-D) 2 MG tablet; Take 1 tablet (2 mg total) by mouth 4 (four) times daily as needed for diarrhea or loose stools.  Dispense: 30 tablet; Refill: 0  Stay well hydrated. Drink Pedialyte. Start Imodium  as directed on the box.  Continue to watch for worsening symptoms. Work excuse note sent to patient portal.  Schedule a virtual appointment or follow up at an urgent care clinic if symptoms don't improve.  Pt verbalized understanding and in agreement.    Follow Up Instructions: I discussed the assessment and treatment plan with the patient. The patient was provided an opportunity to ask questions and all were answered. The patient agreed with the plan and demonstrated an understanding of the instructions.  A copy of instructions were sent to the patient via MyChart unless otherwise noted below.   Patient has requested to receive PHI (AVS, Work Notes, etc) pertaining to this video visit through e-mail as they are currently without active MyChart. They have voiced understand that email is not considered secure and their health information could be viewed by someone other than the patient.   The patient was advised to call back or seek an in-person evaluation if the symptoms worsen or if the condition fails to improve as anticipated.    Rebecca Lovering, PA-C

## 2023-08-19 NOTE — Patient Instructions (Signed)
  Rebecca Spence, thank you for joining Rebecca Borg, PA-C for today's virtual visit.  While this provider is not your primary care provider (PCP), if your PCP is located in our provider database this encounter information will be shared with them immediately following your visit.   A [facility] MyChart account gives you access to today's visit and all your visits, tests, and labs performed at [facility]  click here if you don't have a [facility] MyChart account or go to [email or web address]  Consent: (Patient) Rebecca Spence provided verbal consent for this virtual visit at the beginning of the encounter.  Current Medications:  Current Outpatient Medications:    loperamide  (IMODIUM  A-D) 2 MG tablet, Take 1 tablet (2 mg total) by mouth 4 (four) times daily as needed for diarrhea or loose stools., Disp: 30 tablet, Rfl: 0   amLODipine  (NORVASC ) 10 MG tablet, Take 1 tablet (10 mg total) by mouth daily., Disp: 90 tablet, Rfl: 1   citalopram  (CELEXA ) 10 MG tablet, Take 1 tablet (10 mg total) by mouth daily., Disp: 90 tablet, Rfl: 1   lisinopril -hydrochlorothiazide  (ZESTORETIC ) 20-25 MG tablet, Take 1 tablet by mouth daily., Disp: 90 tablet, Rfl: 1   rosuvastatin  (CRESTOR ) 40 MG tablet, Take 1 tablet (40 mg total) by mouth daily., Disp: 90 tablet, Rfl: 1   Medications ordered in this encounter:  Meds ordered this encounter  Medications   loperamide  (IMODIUM  A-D) 2 MG tablet    Sig: Take 1 tablet (2 mg total) by mouth 4 (four) times daily as needed for diarrhea or loose stools.    Dispense:  30 tablet    Refill:  0    Supervising Provider:   LAMPTEY, PHILIP O [[id number]]     *If you need refills on other medications prior to your next appointment, please contact your pharmacy*  Follow-Up: Call back or seek an in-person evaluation if the symptoms worsen or if the condition fails to improve as anticipated.  [facility] [phone]  Other  Instructions Stay well hydrated. Drink Pedialyte. Start Imodium  as directed on the box.  Continue to watch for worsening symptoms. Work excuse note sent to patient portal.  Schedule a virtual appointment or follow up at an urgent care clinic if symptoms don't improve.    If you have been instructed to have an in-person evaluation today at a local Urgent Care facility, please use the link below. It will take you to a list of all of our available [facility], including address, phone number and hours of operation. Please do not delay care.  [facility]  If you or a family member do not have a primary care provider, use the link below to schedule a visit and establish care. When you choose a [facility] primary care physician or advanced practice provider, you gain a long-term partner in health. Find a Primary Care Provider  Learn more about [facility]'s in-office and virtual care options: [facility] - Get Care Now

## 2023-08-20 LAB — CERVICOVAGINAL ANCILLARY ONLY
Bacterial Vaginitis (gardnerella): POSITIVE — AB
Candida Glabrata: NEGATIVE
Candida Vaginitis: NEGATIVE
Chlamydia: NEGATIVE
Comment: NEGATIVE
Comment: NEGATIVE
Comment: NEGATIVE
Comment: NEGATIVE
Comment: NEGATIVE
Comment: NORMAL
Neisseria Gonorrhea: NEGATIVE
Trichomonas: NEGATIVE

## 2023-08-21 ENCOUNTER — Other Ambulatory Visit: Payer: Self-pay | Admitting: Primary Care

## 2023-08-21 ENCOUNTER — Ambulatory Visit: Payer: Self-pay | Admitting: Primary Care

## 2023-08-21 MED ORDER — METRONIDAZOLE 500 MG PO TABS
500.0000 mg | ORAL_TABLET | Freq: Two times a day (BID) | ORAL | 0 refills | Status: AC
  Filled 2023-08-21: qty 14, 7d supply, fill #0

## 2023-08-22 ENCOUNTER — Other Ambulatory Visit: Payer: Self-pay

## 2023-08-24 LAB — CYTOLOGY - PAP
Comment: NEGATIVE
Diagnosis: NEGATIVE
High risk HPV: NEGATIVE

## 2023-08-26 ENCOUNTER — Other Ambulatory Visit: Payer: Self-pay

## 2023-10-07 ENCOUNTER — Ambulatory Visit (INDEPENDENT_AMBULATORY_CARE_PROVIDER_SITE_OTHER): Admitting: Primary Care

## 2023-11-18 ENCOUNTER — Ambulatory Visit (INDEPENDENT_AMBULATORY_CARE_PROVIDER_SITE_OTHER): Admitting: Primary Care

## 2023-11-19 ENCOUNTER — Telehealth: Admitting: Physician Assistant

## 2023-11-19 ENCOUNTER — Other Ambulatory Visit: Payer: Self-pay

## 2023-11-19 DIAGNOSIS — R197 Diarrhea, unspecified: Secondary | ICD-10-CM | POA: Diagnosis not present

## 2023-11-19 MED ORDER — LOPERAMIDE HCL 2 MG PO TABS
2.0000 mg | ORAL_TABLET | Freq: Four times a day (QID) | ORAL | 0 refills | Status: AC | PRN
  Filled 2023-11-19: qty 24, 6d supply, fill #0

## 2023-11-19 NOTE — Patient Instructions (Signed)
 [name], thank you for joining Delon CHRISTELLA Dickinson, PA-C for today's virtual visit.  While this provider is not your primary care provider (PCP), if your PCP is located in our provider database this encounter information will be shared with them immediately following your visit.   A [facility] MyChart account gives you access to today's visit and all your visits, tests, and labs performed at [facility]  click here if you don't have a [facility] MyChart account or go to [email or web address]  Consent: (Patient) Rebecca Spence provided verbal consent for this virtual visit at the beginning of the encounter.  Current Medications:  Current Outpatient Medications:    amLODipine  (NORVASC ) 10 MG tablet, Take 1 tablet (10 mg total) by mouth daily., Disp: 90 tablet, Rfl: 1   citalopram  (CELEXA ) 10 MG tablet, Take 1 tablet (10 mg total) by mouth daily., Disp: 90 tablet, Rfl: 1   lisinopril -hydrochlorothiazide  (ZESTORETIC ) 20-25 MG tablet, Take 1 tablet by mouth daily., Disp: 90 tablet, Rfl: 1   loperamide  (IMODIUM  A-D) 2 MG tablet, Take 1 tablet (2 mg total) by mouth 4 (four) times daily as needed for diarrhea or loose stools., Disp: 30 tablet, Rfl: 0   metroNIDAZOLE  (FLAGYL ) 500 MG tablet, Take 1 tablet (500 mg total) by mouth 2 (two) times daily., Disp: 14 tablet, Rfl: 0   rosuvastatin  (CRESTOR ) 40 MG tablet, Take 1 tablet (40 mg total) by mouth daily., Disp: 90 tablet, Rfl: 1   Medications ordered in this encounter:  Meds ordered this encounter  Medications   loperamide  (IMODIUM  A-D) 2 MG tablet    Sig: Take 1 tablet (2 mg total) by mouth 4 (four) times daily as needed for diarrhea or loose stools.    Dispense:  30 tablet    Refill:  0    Supervising Provider:   LAMPTEY, PHILIP O [[id number]]     *If you need refills on other medications prior to your next appointment, please contact your pharmacy*  Follow-Up: Call back or seek an in-person evaluation if the  symptoms worsen or if the condition fails to improve as anticipated.   Virtual Care [phone]  Other Instructions  Food Choices to Help Relieve Diarrhea, Adult Diarrhea can make you feel weak and cause you to become dehydrated. Dehydration is a condition in which there is not enough water or other fluids in the body. It is important to choose the right foods and drinks to: Relieve diarrhea. Replace lost fluids and nutrients. Prevent dehydration. What are tips for following this plan? Relieving diarrhea Avoid foods that make your diarrhea worse. These may include: Foods and drinks that are sweetened with high-fructose corn syrup, honey, or sweeteners such as xylitol, sorbitol, and mannitol. Check food labels for these ingredients. Fried, greasy, or spicy foods. Raw fruits and vegetables. Eat foods that are rich in probiotics. These include foods such as yogurt and fermented milk products. Probiotics can help increase healthy bacteria in your stomach and intestines (gastrointestinal or GI tract). This may help digestion and stop diarrhea. If you have lactose intolerance, avoid dairy products. These may make your diarrhea worse. Take medicine to help stop diarrhea only as told by your health care provider. Replacing nutrients  Eat bland, easy-to-digest foods in small amounts as you are able, until your diarrhea starts to get better. These foods include bananas, applesauce, rice, toast, and crackers. Over time, add nutrient-rich foods as your body tolerates them or as told by your health care provider. These include:  Well-cooked protein foods, such as eggs, lean meats like fish or chicken without skin, and tofu. Peeled, seeded, and soft-cooked fruits and vegetables. Low-fat dairy products. Whole grains. Take vitamin and mineral supplements as told by your health care provider. Preventing dehydration  Start by sipping water or a solution to prevent dehydration (oral  rehydration solution, or ORS). This is a drink that helps replace fluids and minerals your body has lost. You can buy an ORS at pharmacies and retail stores. Try to drink at least 8-10 cups (2,000-2,500 mL) of fluid each day to help replace lost fluids. If your urine is pale yellow, you are getting enough fluids. You may drink other liquids in addition to water, such as fruit juice that you have added water to (diluted fruit juice) or low-calorie sports drinks, as tolerated or as told by your health care provider. Avoid drinks with caffeine , such as coffee, tea, or soft drinks. Avoid alcohol. This information is not intended to replace advice given to you by your health care provider. Make sure you discuss any questions you have with your health care provider. Document Revised: 06/26/2021 Document Reviewed: 06/26/2021 Elsevier Patient Education  2024 Elsevier Inc.   If you have been instructed to have an in-person evaluation today at a local Urgent Care facility, please use the link below. It will take you to a list of all of our available [facility], including address, phone number and hours of operation. Please do not delay care.  [facility]  If you or a family member do not have a primary care provider, use the link below to schedule a visit and establish care. When you choose a [facility] primary care physician or advanced practice provider, you gain a long-term partner in health. Find a Primary Care Provider  Learn more about [facility]'s in-office and virtual care options: [facility] - Get Care Now

## 2023-11-19 NOTE — Progress Notes (Signed)
 Virtual Visit Consent   Rebecca Spence, you are scheduled for a virtual visit with a [facility] provider today. Just as with appointments in the office, your consent must be obtained to participate. Your consent will be active for this visit and any virtual visit you may have with one of our providers in the next 365 days. If you have a MyChart account, a copy of this consent can be sent to you electronically.  As this is a virtual visit, video technology does not allow for your provider to perform a traditional examination. This may limit your provider's ability to fully assess your condition. If your provider identifies any concerns that need to be evaluated in person or the need to arrange testing (such as labs, EKG, etc.), we will make arrangements to do so. Although advances in technology are sophisticated, we cannot ensure that it will always work on either your end or our end. If the connection with a video visit is poor, the visit may have to be switched to a telephone visit. With either a video or telephone visit, we are not always able to ensure that we have a secure connection.  By engaging in this virtual visit, you consent to the provision of healthcare and authorize for your insurance to be billed (if applicable) for the services provided during this visit. Depending on your insurance coverage, you may receive a charge related to this service.  I need to obtain your verbal consent now. Are you willing to proceed with your visit today? Rebecca Spence has provided verbal consent on 11/19/2023 for a virtual visit (video or telephone). Delon CHRISTELLA Dickinson, PA-C  Date: 11/19/2023 3:09 PM   Virtual Visit via Video Note   I, Delon CHRISTELLA Dickinson, connected with  Rebecca Spence  ([id number], May 09, 1980) (43) on 11/19/23 at  3:00 PM EDT by a video-enabled telemedicine application and verified that I am speaking with the correct person using two identifiers.  Location: Patient:  Virtual Visit Location Patient: Home Provider: Virtual Visit Location Provider: Home Office   I discussed the limitations of evaluation and management by telemedicine and the availability of in person appointments. The patient expressed understanding and agreed to proceed.    History of Present Illness: Rebecca Spence is a 43 y.o. who identifies as a female who was assigned female at birth, and is being seen today for diarrhea and fever.  HPI: Diarrhea  This is a new problem. The current episode started yesterday. The problem occurs 2 to 4 times per day. The problem has been gradually worsening. The stool consistency is described as Watery. The patient states that diarrhea awakens her from sleep. Associated symptoms include abdominal pain (yesterday), coughing, a fever (around 101) and increased flatus. Pertinent negatives include no bloating, chills, headaches, sweats or vomiting. Nothing aggravates the symptoms. Risk factors include ill contacts (son with similar symptoms recently). Treatments tried: cough medication, ibuprofen . The treatment provided no relief.     Problems:  Patient Active Problem List   Diagnosis Date Noted   Elevated glucose level 10/07/2022   Eczema 10/29/2016   DM type 2 (diabetes mellitus, type 2) (HCC) 10/01/2016   Hyperlipidemia 10/01/2016   Essential hypertension 01/21/2014    Allergies:  Allergies  Allergen Reactions   Orange Anaphylaxis and Hives    My throat starts closing up, my tongue, lips started swelling & burning.  Similar reaction to onions.   Peach [Prunus Persica] Hives    Throat closes up, lips swell and  burn   Fentanyl      RN witnessed patient having hives and lip swelling.    Medications:  Current Outpatient Medications:    amLODipine  (NORVASC ) 10 MG tablet, Take 1 tablet (10 mg total) by mouth daily., Disp: 90 tablet, Rfl: 1   citalopram  (CELEXA ) 10 MG tablet, Take 1 tablet (10 mg total) by mouth daily., Disp: 90 tablet, Rfl: 1    lisinopril -hydrochlorothiazide  (ZESTORETIC ) 20-25 MG tablet, Take 1 tablet by mouth daily., Disp: 90 tablet, Rfl: 1   loperamide  (IMODIUM  A-D) 2 MG tablet, Take 1 tablet (2 mg total) by mouth 4 (four) times daily as needed for diarrhea or loose stools., Disp: 30 tablet, Rfl: 0   metroNIDAZOLE  (FLAGYL ) 500 MG tablet, Take 1 tablet (500 mg total) by mouth 2 (two) times daily., Disp: 14 tablet, Rfl: 0   rosuvastatin  (CRESTOR ) 40 MG tablet, Take 1 tablet (40 mg total) by mouth daily., Disp: 90 tablet, Rfl: 1  Observations/Objective: Patient is well-developed, well-nourished in no acute distress.  Resting comfortably at home.  Head is normocephalic, atraumatic.  No labored breathing.  Speech is clear and coherent with logical content.  Patient is alert and oriented at baseline.    Assessment and Plan: 1. Diarrhea, unspecified type - loperamide  (IMODIUM  A-D) 2 MG tablet; Take 1 tablet (2 mg total) by mouth 4 (four) times daily as needed for diarrhea or loose stools.  Dispense: 30 tablet; Refill: 0  - Suspect viral gastroenteritis - Imodium  for diarrhea - Push fluids, electrolyte beverages - Liquid diet, then increase to soft/bland (BRAT) diet over next day, then increase diet as tolerated - Seek in person evaluation if not improving or symptoms worsen   Follow Up Instructions: I discussed the assessment and treatment plan with the patient. The patient was provided an opportunity to ask questions and all were answered. The patient agreed with the plan and demonstrated an understanding of the instructions.  A copy of instructions were sent to the patient via MyChart unless otherwise noted below.    The patient was advised to call back or seek an in-person evaluation if the symptoms worsen or if the condition fails to improve as anticipated.    Delon CHRISTELLA Dickinson, PA-C

## 2023-11-27 ENCOUNTER — Other Ambulatory Visit: Payer: Self-pay

## 2023-11-28 ENCOUNTER — Other Ambulatory Visit: Payer: Self-pay

## 2023-12-06 ENCOUNTER — Telehealth: Payer: Self-pay | Admitting: Family Medicine

## 2023-12-06 DIAGNOSIS — B9689 Other specified bacterial agents as the cause of diseases classified elsewhere: Secondary | ICD-10-CM

## 2023-12-06 DIAGNOSIS — J019 Acute sinusitis, unspecified: Secondary | ICD-10-CM

## 2023-12-06 MED ORDER — PROMETHAZINE-DM 6.25-15 MG/5ML PO SYRP
5.0000 mL | ORAL_SOLUTION | Freq: Four times a day (QID) | ORAL | 0 refills | Status: AC | PRN
  Filled 2023-12-06: qty 118, 10d supply, fill #0

## 2023-12-06 MED ORDER — AMOXICILLIN-POT CLAVULANATE 875-125 MG PO TABS
1.0000 | ORAL_TABLET | Freq: Two times a day (BID) | ORAL | 0 refills | Status: AC
  Filled 2023-12-06: qty 20, 10d supply, fill #0

## 2023-12-06 NOTE — Progress Notes (Unsigned)
 Virtual Visit Consent   [name], you are scheduled for a virtual visit with a [facility] provider today. Just as with appointments in the office, your consent must be obtained to participate. Your consent will be active for this visit and any virtual visit you may have with one of our providers in the next 365 days. If you have a MyChart account, a copy of this consent can be sent to you electronically.  As this is a virtual visit, video technology does not allow for your provider to perform a traditional examination. This may limit your provider's ability to fully assess your condition. If your provider identifies any concerns that need to be evaluated in person or the need to arrange testing (such as labs, EKG, etc.), we will make arrangements to do so. Although advances in technology are sophisticated, we cannot ensure that it will always work on either your end or our end. If the connection with a video visit is poor, the visit may have to be switched to a telephone visit. With either a video or telephone visit, we are not always able to ensure that we have a secure connection.  By engaging in this virtual visit, you consent to the provision of healthcare and authorize for your insurance to be billed (if applicable) for the services provided during this visit. Depending on your insurance coverage, you may receive a charge related to this service.  I need to obtain your verbal consent now. Are you willing to proceed with your visit today? Rebecca Spence has provided verbal consent on 12/06/2023 for a virtual visit (video or telephone). Loa Lamp, FNP  Date: 12/06/2023 5:57 PM   Virtual Visit via Video Note   I, Loa Lamp, connected with  Rebecca Spence  ([id number], [birth year]) on 12/06/23 at  6:00 PM EST by a video-enabled telemedicine application and verified that I am speaking with the correct person using two identifiers.  Location: Patient: Virtual Visit Location  Patient: Home Provider: Virtual Visit Location Provider: Home Office   I discussed the limitations of evaluation and management by telemedicine and the availability of in person appointments. The patient expressed understanding and agreed to proceed.    History of Present Illness: Rebecca Spence is a 43 y.o. who identifies as a female who was assigned female at birth, and is being seen today for headache, fever, covid test at home neg, sx for 4 days, cough, green mucus, no wheezing or sob. Sinus pain. Head congestion.   HPI: HPI  Problems:  Patient Active Problem List   Diagnosis Date Noted   Elevated glucose level 10/07/2022   Eczema 10/29/2016   DM type 2 (diabetes mellitus, type 2) (HCC) 10/01/2016   Hyperlipidemia 10/01/2016   Essential hypertension 01/21/2014    Allergies:  Allergies  Allergen Reactions   Orange Anaphylaxis and Hives    My throat starts closing up, my tongue, lips started swelling & burning.  Similar reaction to onions.   Peach [Prunus Persica] Hives    Throat closes up, lips swell and burn   Fentanyl      RN witnessed patient having hives and lip swelling.    Medications:  Current Outpatient Medications:    amoxicillin -clavulanate (AUGMENTIN ) 875-125 MG tablet, Take 1 tablet by mouth 2 (two) times daily for 10 days., Disp: 20 tablet, Rfl: 0   promethazine -dextromethorphan (PROMETHAZINE -DM) 6.25-15 MG/5ML syrup, Take 5 mLs by mouth 4 (four) times daily as needed for up to 10 days for cough., Disp:  118 mL, Rfl: 0   amLODipine  (NORVASC ) 10 MG tablet, Take 1 tablet (10 mg total) by mouth daily., Disp: 90 tablet, Rfl: 1   citalopram  (CELEXA ) 10 MG tablet, Take 1 tablet (10 mg total) by mouth daily., Disp: 90 tablet, Rfl: 1   lisinopril -hydrochlorothiazide  (ZESTORETIC ) 20-25 MG tablet, Take 1 tablet by mouth daily., Disp: 90 tablet, Rfl: 1   loperamide  (IMODIUM  A-D) 2 MG tablet, Take 1 tablet (2 mg total) by mouth 4 (four) times daily as needed for diarrhea or  loose stools., Disp: 30 tablet, Rfl: 0   metroNIDAZOLE  (FLAGYL ) 500 MG tablet, Take 1 tablet (500 mg total) by mouth 2 (two) times daily., Disp: 14 tablet, Rfl: 0   rosuvastatin  (CRESTOR ) 40 MG tablet, Take 1 tablet (40 mg total) by mouth daily., Disp: 90 tablet, Rfl: 1  Observations/Objective: Patient is well-developed, well-nourished in no acute distress.  Resting comfortably  at home.  Head is normocephalic, atraumatic.  No labored breathing.  Speech is clear and coherent with logical content.  Patient is alert and oriented at baseline.    Assessment and Plan: 1. Acute bacterial sinusitis (Primary)  Increase fluids humidifier at night, tylenol  or ibuprofen  as directed UC if sx persist or worsen.   Follow Up Instructions: I discussed the assessment and treatment plan with the patient. The patient was provided an opportunity to ask questions and all were answered. The patient agreed with the plan and demonstrated an understanding of the instructions.  A copy of instructions were sent to the patient via MyChart unless otherwise noted below.     The patient was advised to call back or seek an in-person evaluation if the symptoms worsen or if the condition fails to improve as anticipated.    Phung Kotas, FNP

## 2023-12-06 NOTE — Patient Instructions (Signed)

## 2023-12-08 ENCOUNTER — Other Ambulatory Visit: Payer: Self-pay

## 2023-12-16 ENCOUNTER — Other Ambulatory Visit: Payer: Self-pay

## 2023-12-17 ENCOUNTER — Other Ambulatory Visit: Payer: Self-pay

## 2024-02-12 ENCOUNTER — Other Ambulatory Visit: Payer: Self-pay

## 2024-02-13 ENCOUNTER — Telehealth: Payer: Self-pay | Admitting: Family Medicine

## 2024-02-13 ENCOUNTER — Telehealth: Payer: Self-pay | Admitting: Physician Assistant

## 2024-02-13 ENCOUNTER — Other Ambulatory Visit: Payer: Self-pay

## 2024-02-13 DIAGNOSIS — R112 Nausea with vomiting, unspecified: Secondary | ICD-10-CM

## 2024-02-13 DIAGNOSIS — R197 Diarrhea, unspecified: Secondary | ICD-10-CM

## 2024-02-13 MED ORDER — ONDANSETRON 4 MG PO TBDP
4.0000 mg | ORAL_TABLET | Freq: Three times a day (TID) | ORAL | 0 refills | Status: AC | PRN
  Filled 2024-02-13: qty 15, 5d supply, fill #0

## 2024-02-13 NOTE — Progress Notes (Signed)
 Pt intended to make appointment for her son.  VOID THIS VISIT.

## 2024-02-13 NOTE — Progress Notes (Signed)
 We are sorry that you are not feeling well.  Here is how we plan to help!  Based on what you have shared with me it looks like you have Acute Infectious Diarrhea.  Most cases of acute diarrhea are due to infections with virus and bacteria and are self-limited conditions lasting less than 14 days.  For your symptoms you may take Imodium  2 mg tablets that are over the counter at your local pharmacy. Take two tablet now and then one after each loose stool up to 6 a day.  Antibiotics are not needed for most people with diarrhea.  Optional: Zofran  4 mg 1 tablet every 8 hours as needed for nausea and vomiting   HOME CARE We recommend changing your diet to help with your symptoms for the next few days. Drink plenty of fluids that contain water salt and sugar. Sports drinks such as Gatorade may help.  You may try broths, soups, bananas, applesauce, soft breads, mashed potatoes or crackers.  You are considered infectious for as long as the diarrhea continues. Hand washing or use of alcohol based hand sanitizers is recommend. It is best to stay out of work or school until your symptoms stop.   GET HELP RIGHT AWAY If you have dark yellow colored urine or do not pass urine frequently you should drink more fluids.   If your symptoms worsen  If you feel like you are going to pass out (faint) You have a new problem  MAKE SURE YOU  Understand these instructions. Will watch your condition. Will get help right away if you are not doing well or get worse.  Your e-visit answers were reviewed by a board certified advanced clinical practitioner to complete your personal care plan.  Depending on the condition, your plan could have included both over the counter or prescription medications.  If there is a problem please reply  once you have received a response from your provider.  Your safety is important to us .  If you have drug allergies check your prescription carefully.    You can use MyChart to ask  questions about todays visit, request a non-urgent call back, or ask for a work or school excuse for 24 hours related to this e-Visit. If it has been greater than 24 hours you will need to follow up with your provider, or enter a new e-Visit to address those concerns.   You will get an e-mail in the next two days asking about your experience.  I hope that your e-visit has been valuable and will speed your recovery. Thank you for using e-visits.   I have spent 5 minutes in review of e-visit questionnaire, review and updating patient chart, medical decision making and response to patient.   Roosvelt Mater, PA-C

## 2024-02-14 ENCOUNTER — Other Ambulatory Visit: Payer: Self-pay

## 2024-02-16 ENCOUNTER — Other Ambulatory Visit: Payer: Self-pay

## 2024-02-25 ENCOUNTER — Other Ambulatory Visit: Payer: Self-pay
